# Patient Record
Sex: Male | Born: 1950 | Race: White | Hispanic: No | Marital: Married | Smoking: Never smoker
Health system: Southern US, Academic
[De-identification: ages and names within clinical notes are randomized; demographics above are authoritative.]

## PROBLEM LIST (undated history)

## (undated) DIAGNOSIS — Z86718 Personal history of other venous thrombosis and embolism: Secondary | ICD-10-CM

## (undated) DIAGNOSIS — N200 Calculus of kidney: Secondary | ICD-10-CM

## (undated) DIAGNOSIS — Z955 Presence of coronary angioplasty implant and graft: Secondary | ICD-10-CM

## (undated) DIAGNOSIS — Z9989 Dependence on other enabling machines and devices: Secondary | ICD-10-CM

## (undated) DIAGNOSIS — J309 Allergic rhinitis, unspecified: Secondary | ICD-10-CM

## (undated) DIAGNOSIS — E785 Hyperlipidemia, unspecified: Secondary | ICD-10-CM

## (undated) DIAGNOSIS — G4730 Sleep apnea, unspecified: Secondary | ICD-10-CM

## (undated) DIAGNOSIS — Z973 Presence of spectacles and contact lenses: Secondary | ICD-10-CM

## (undated) DIAGNOSIS — K219 Gastro-esophageal reflux disease without esophagitis: Secondary | ICD-10-CM

## (undated) DIAGNOSIS — R600 Localized edema: Secondary | ICD-10-CM

## (undated) DIAGNOSIS — I10 Essential (primary) hypertension: Secondary | ICD-10-CM

## (undated) DIAGNOSIS — M539 Dorsopathy, unspecified: Secondary | ICD-10-CM

## (undated) DIAGNOSIS — M1990 Unspecified osteoarthritis, unspecified site: Secondary | ICD-10-CM

## (undated) DIAGNOSIS — I219 Acute myocardial infarction, unspecified: Secondary | ICD-10-CM

## (undated) DIAGNOSIS — E7800 Pure hypercholesterolemia, unspecified: Secondary | ICD-10-CM

## (undated) HISTORY — PX: CYSTOSCOPY W/ RETROGRADES: SHX1426

## (undated) HISTORY — PX: HX HERNIA REPAIR: SHX51

## (undated) HISTORY — PX: ACHILLES TENDON REPAIR: SUR1153

## (undated) HISTORY — PX: CATARACT EXTRACTION: SUR2

## (undated) HISTORY — PX: HX CORONARY STENT PLACEMENT: SHX49

---

## 1994-03-09 ENCOUNTER — Other Ambulatory Visit (HOSPITAL_COMMUNITY): Payer: Self-pay

## 2016-08-20 DIAGNOSIS — I213 ST elevation (STEMI) myocardial infarction of unspecified site: Secondary | ICD-10-CM | POA: Insufficient documentation

## 2016-09-07 DIAGNOSIS — I5043 Acute on chronic combined systolic (congestive) and diastolic (congestive) heart failure: Secondary | ICD-10-CM | POA: Insufficient documentation

## 2016-09-07 DIAGNOSIS — I2510 Atherosclerotic heart disease of native coronary artery without angina pectoris: Secondary | ICD-10-CM | POA: Insufficient documentation

## 2016-10-19 DIAGNOSIS — Z8679 Personal history of other diseases of the circulatory system: Secondary | ICD-10-CM | POA: Insufficient documentation

## 2020-01-22 LAB — LAB: COLOGUARD RESULT: NEGATIVE

## 2021-08-19 ENCOUNTER — Other Ambulatory Visit (HOSPITAL_COMMUNITY): Payer: Self-pay | Admitting: Orthopaedic Surgery

## 2021-08-19 ENCOUNTER — Other Ambulatory Visit (INDEPENDENT_AMBULATORY_CARE_PROVIDER_SITE_OTHER): Payer: Self-pay | Admitting: Surgery

## 2021-08-19 DIAGNOSIS — K4090 Unilateral inguinal hernia, without obstruction or gangrene, not specified as recurrent: Secondary | ICD-10-CM

## 2021-08-19 DIAGNOSIS — I10 Essential (primary) hypertension: Secondary | ICD-10-CM

## 2021-08-19 DIAGNOSIS — M1612 Unilateral primary osteoarthritis, left hip: Secondary | ICD-10-CM

## 2021-08-25 LAB — ENTER/EDIT EXTERNAL COMMON LAB RESULTS
CHOLESTEROL: 94
HDL-CHOLESTEROL: 43
HEMOGLOBIN A1C: 6.3
LDL (CALCULATED): 39
LDL CHOLESTEROL,DIRECT: 12
TRIGLYCERIDES: 49

## 2021-08-27 ENCOUNTER — Other Ambulatory Visit: Payer: Medicare PPO | Attending: Surgery

## 2021-08-27 ENCOUNTER — Other Ambulatory Visit: Payer: Self-pay

## 2021-08-27 ENCOUNTER — Inpatient Hospital Stay (HOSPITAL_COMMUNITY)
Admission: RE | Admit: 2021-08-27 | Discharge: 2021-08-27 | Disposition: A | Discharge status: Home / Self Care | Payer: Medicare PPO | Source: Ambulatory Visit | Attending: Surgery | Admitting: Surgery

## 2021-08-27 DIAGNOSIS — K4090 Unilateral inguinal hernia, without obstruction or gangrene, not specified as recurrent: Secondary | ICD-10-CM | POA: Insufficient documentation

## 2021-08-27 DIAGNOSIS — I10 Essential (primary) hypertension: Secondary | ICD-10-CM

## 2021-08-27 LAB — CBC WITH DIFF
BASOPHIL #: 0 10*3/uL (ref 0.00–2.50)
BASOPHIL %: 1 % (ref 0–3)
EOSINOPHIL #: 0.2 10*3/uL (ref 0.00–2.40)
EOSINOPHIL %: 3 % (ref 0–7)
HCT: 41.7 % — ABNORMAL LOW (ref 42.0–51.0)
HGB: 14.5 g/dL (ref 13.5–18.0)
LYMPHOCYTE #: 1.1 10*3/uL — ABNORMAL LOW (ref 2.10–11.00)
LYMPHOCYTE %: 20 % — ABNORMAL LOW (ref 25–45)
MCH: 32.1 pg — ABNORMAL HIGH (ref 27.0–32.0)
MCHC: 34.8 g/dL (ref 32.0–36.0)
MCV: 92.2 fL (ref 78.0–99.0)
MONOCYTE #: 0.4 10*3/uL (ref 0.00–4.10)
MONOCYTE %: 7 % (ref 0–12)
MPV: 7.9 fL (ref 7.4–10.4)
NEUTROPHIL #: 3.9 10*3/uL — ABNORMAL LOW (ref 4.10–29.00)
NEUTROPHIL %: 70 % (ref 40–76)
PLATELETS: 125 10*3/uL — ABNORMAL LOW (ref 140–440)
RBC: 4.53 10*6/uL (ref 4.20–6.00)
RDW: 14 % (ref 11.6–14.8)
WBC: 5.6 10*3/uL (ref 4.0–10.5)
WBCS UNCORRECTED: 5.6 10*3/uL

## 2021-08-27 LAB — COMPREHENSIVE METABOLIC PANEL, NON-FASTING
ALBUMIN/GLOBULIN RATIO: 1.6 — ABNORMAL HIGH (ref 0.8–1.4)
ALBUMIN: 4.2 g/dL (ref 3.5–5.7)
ALKALINE PHOSPHATASE: 67 U/L (ref 34–104)
ALT (SGPT): 18 U/L (ref 7–52)
ANION GAP: 7 mmol/L — ABNORMAL LOW (ref 10–20)
AST (SGOT): 14 U/L (ref 13–39)
BILIRUBIN TOTAL: 0.8 mg/dL (ref 0.3–1.2)
BUN/CREA RATIO: 25 — ABNORMAL HIGH (ref 6–22)
BUN: 27 mg/dL — ABNORMAL HIGH (ref 7–25)
CALCIUM, CORRECTED: 9.1 mg/dL (ref 8.9–10.8)
CALCIUM: 9.3 mg/dL (ref 8.6–10.3)
CHLORIDE: 105 mmol/L (ref 98–107)
CO2 TOTAL: 28 mmol/L (ref 21–31)
CREATININE: 1.09 mg/dL (ref 0.60–1.30)
ESTIMATED GFR: 74 mL/min/{1.73_m2} (ref >59)
GLOBULIN: 2.6 — ABNORMAL LOW (ref 2.9–5.4)
GLUCOSE: 113 mg/dL — ABNORMAL HIGH (ref 74–109)
OSMOLALITY, CALCULATED: 285 mOsm/kg (ref 270–290)
POTASSIUM: 4.3 mmol/L (ref 3.5–5.1)
PROTEIN TOTAL: 6.8 g/dL (ref 6.4–8.9)
SODIUM: 140 mmol/L (ref 136–145)

## 2021-09-09 ENCOUNTER — Encounter (HOSPITAL_COMMUNITY): Payer: Self-pay | Admitting: Surgery

## 2021-09-09 ENCOUNTER — Other Ambulatory Visit: Payer: Self-pay

## 2021-09-09 ENCOUNTER — Encounter (HOSPITAL_COMMUNITY): Admission: RE | Payer: Self-pay | Source: Ambulatory Visit

## 2021-09-09 ENCOUNTER — Inpatient Hospital Stay
Admission: RE | Admit: 2021-09-09 | Discharge: 2021-09-09 | Disposition: A | Discharge status: Home / Self Care | Payer: Medicare PPO | Source: Ambulatory Visit | Attending: Surgery | Admitting: Surgery

## 2021-09-09 ENCOUNTER — Encounter (HOSPITAL_COMMUNITY)
Admission: RE | Disposition: A | Discharge status: Home / Self Care | Payer: Self-pay | Source: Ambulatory Visit | Attending: Surgery

## 2021-09-09 ENCOUNTER — Ambulatory Visit (HOSPITAL_COMMUNITY): Payer: Medicare PPO | Admitting: Anesthesiology

## 2021-09-09 ENCOUNTER — Inpatient Hospital Stay (HOSPITAL_COMMUNITY): Admission: RE | Admit: 2021-09-09 | Payer: Medicare PPO | Source: Ambulatory Visit | Admitting: Surgery

## 2021-09-09 ENCOUNTER — Encounter (HOSPITAL_COMMUNITY): Payer: Medicare PPO | Admitting: Surgery

## 2021-09-09 DIAGNOSIS — K4090 Unilateral inguinal hernia, without obstruction or gangrene, not specified as recurrent: Secondary | ICD-10-CM | POA: Insufficient documentation

## 2021-09-09 HISTORY — DX: Calculus of kidney: N20.0

## 2021-09-09 HISTORY — DX: Allergic rhinitis, unspecified: J30.9

## 2021-09-09 HISTORY — DX: Unspecified osteoarthritis, unspecified site: M19.90

## 2021-09-09 HISTORY — DX: Pure hypercholesterolemia, unspecified: E78.00

## 2021-09-09 HISTORY — DX: Acute myocardial infarction, unspecified: I21.9

## 2021-09-09 HISTORY — DX: Gastro-esophageal reflux disease without esophagitis: K21.9

## 2021-09-09 HISTORY — DX: Essential (primary) hypertension: I10

## 2021-09-09 HISTORY — DX: Sleep apnea, unspecified: G47.30

## 2021-09-09 SURGERY — REPAIR HERNIA INGUINAL
Anesthesia: General | Site: Groin | Laterality: Right

## 2021-09-09 SURGERY — ROBOTIC REPAIR HERNIA INGUINAL LAPAROSCOPIC
Anesthesia: General | Site: Groin | Laterality: Right | Wound class: Clean Contaminated Wounds-The respiratory, GI, Genital, or urinary

## 2021-09-09 MED ORDER — BACITRACIN ZINC 500 UNIT-POLYMYXIN B 10,000 UNIT/GRAM TOPICAL OINTMENT
TOPICAL_OINTMENT | Freq: Once | CUTANEOUS | Status: DC | PRN
Start: 2021-09-09 — End: 2021-09-09
  Administered 2021-09-09: 14.2 mg via TOPICAL

## 2021-09-09 MED ORDER — MORPHINE 2 MG/ML INJECTION WRAPPER
2.0000 mg | INJECTION | INTRAMUSCULAR | Status: DC | PRN
Start: 2021-09-09 — End: 2021-09-09

## 2021-09-09 MED ORDER — SUGAMMADEX 100 MG/ML INTRAVENOUS SOLUTION
Freq: Once | INTRAVENOUS | Status: DC | PRN
Start: 2021-09-09 — End: 2021-09-09
  Administered 2021-09-09: 400 mg via INTRAVENOUS

## 2021-09-09 MED ORDER — FENTANYL (PF) 50 MCG/ML INJECTION WRAPPER
INJECTION | Freq: Once | INTRAMUSCULAR | Status: DC | PRN
Start: 2021-09-09 — End: 2021-09-09
  Administered 2021-09-09: 25 ug via INTRAVENOUS
  Administered 2021-09-09: 50 ug via INTRAVENOUS
  Administered 2021-09-09: 25 ug via INTRAVENOUS

## 2021-09-09 MED ORDER — LACTATED RINGERS INTRAVENOUS SOLUTION
INTRAVENOUS | Status: DC
Start: 2021-09-09 — End: 2021-09-09

## 2021-09-09 MED ORDER — EPHEDRINE SULFATE 50 MG/ML INTRAVENOUS SOLUTION
Freq: Once | INTRAVENOUS | Status: DC | PRN
Start: 2021-09-09 — End: 2021-09-09
  Administered 2021-09-09 (×6): 5 mg via INTRAVENOUS

## 2021-09-09 MED ORDER — ONDANSETRON HCL (PF) 4 MG/2 ML INJECTION SOLUTION
4.0000 mg | Freq: Once | INTRAMUSCULAR | Status: AC
Start: 2021-09-09 — End: 2021-09-09
  Administered 2021-09-09: 4 mg via INTRAVENOUS

## 2021-09-09 MED ORDER — FAMOTIDINE (PF) 20 MG/2 ML INTRAVENOUS SOLUTION
INTRAVENOUS | Status: AC
Start: 2021-09-09 — End: 2021-09-09
  Filled 2021-09-09: qty 2

## 2021-09-09 MED ORDER — MIDAZOLAM 5 MG/ML INJECTION WRAPPER
1.0000 mg | Freq: Once | INTRAMUSCULAR | Status: DC | PRN
Start: 2021-09-09 — End: 2021-09-09
  Administered 2021-09-09: 1 mg via INTRAVENOUS

## 2021-09-09 MED ORDER — ONDANSETRON HCL (PF) 4 MG/2 ML INJECTION SOLUTION
4.0000 mg | Freq: Four times a day (QID) | INTRAMUSCULAR | Status: DC | PRN
Start: 2021-09-09 — End: 2021-09-09

## 2021-09-09 MED ORDER — ALBUTEROL SULFATE 2.5 MG/3 ML (0.083 %) SOLUTION FOR NEBULIZATION
2.5000 mg | INHALATION_SOLUTION | Freq: Once | RESPIRATORY_TRACT | Status: DC | PRN
Start: 2021-09-09 — End: 2021-09-09

## 2021-09-09 MED ORDER — HYDROCODONE 7.5 MG-ACETAMINOPHEN 325 MG TABLET
1.0000 | ORAL_TABLET | Freq: Four times a day (QID) | ORAL | 0 refills | Status: AC | PRN
Start: 2021-09-09 — End: 2021-09-12

## 2021-09-09 MED ORDER — SODIUM CHLORIDE 0.9 % (FLUSH) INJECTION SYRINGE
3.0000 mL | INJECTION | Freq: Three times a day (TID) | INTRAMUSCULAR | Status: DC
Start: 2021-09-09 — End: 2021-09-09

## 2021-09-09 MED ORDER — SODIUM CHLORIDE 0.9 % (FLUSH) INJECTION SYRINGE
3.0000 mL | INJECTION | INTRAMUSCULAR | Status: DC | PRN
Start: 2021-09-09 — End: 2021-09-09

## 2021-09-09 MED ORDER — BACITRACIN ZINC 500 UNIT-POLYMYXIN B 10,000 UNIT/GRAM TOPICAL OINTMENT
TOPICAL_OINTMENT | CUTANEOUS | Status: AC
Start: 2021-09-09 — End: 2021-09-09
  Filled 2021-09-09: qty 14.2

## 2021-09-09 MED ORDER — ROPIVACAINE (PF) 2 MG/ML (0.2 %) INJECTION SOLUTION
Freq: Once | INTRAMUSCULAR | Status: DC | PRN
Start: 2021-09-09 — End: 2021-09-09
  Administered 2021-09-09: 40 mL via INTRAMUSCULAR

## 2021-09-09 MED ORDER — FENTANYL (PF) 50 MCG/ML INJECTION WRAPPER
50.0000 ug | INJECTION | INTRAMUSCULAR | Status: DC | PRN
Start: 2021-09-09 — End: 2021-09-09

## 2021-09-09 MED ORDER — ROPIVACAINE (PF) 2 MG/ML (0.2 %) INJECTION SOLUTION
INTRAMUSCULAR | Status: AC
Start: 2021-09-09 — End: 2021-09-09
  Filled 2021-09-09: qty 10

## 2021-09-09 MED ORDER — ONDANSETRON 4 MG DISINTEGRATING TABLET
4.0000 mg | ORAL_TABLET | Freq: Three times a day (TID) | ORAL | 0 refills | Status: DC | PRN
Start: 2021-09-09 — End: 2022-01-18

## 2021-09-09 MED ORDER — FENTANYL (PF) 50 MCG/ML INJECTION SOLUTION
INTRAMUSCULAR | Status: AC
Start: 2021-09-09 — End: 2021-09-09
  Filled 2021-09-09: qty 2

## 2021-09-09 MED ORDER — HYDROMORPHONE 2 MG/ML INJECTION WRAPPER
0.2000 mg | INJECTION | INTRAMUSCULAR | Status: DC | PRN
Start: 2021-09-09 — End: 2021-09-09

## 2021-09-09 MED ORDER — FAMOTIDINE (PF) 20 MG/2 ML INTRAVENOUS SOLUTION
20.0000 mg | Freq: Once | INTRAVENOUS | Status: AC
Start: 2021-09-09 — End: 2021-09-09
  Administered 2021-09-09: 20 mg via INTRAVENOUS

## 2021-09-09 MED ORDER — CEFAZOLIN 1 GRAM SOLUTION FOR INJECTION
Freq: Once | INTRAMUSCULAR | Status: DC | PRN
Start: 2021-09-09 — End: 2021-09-09
  Administered 2021-09-09: 2000 mg via INTRAVENOUS

## 2021-09-09 MED ORDER — METOCLOPRAMIDE 5 MG/ML INJECTION SOLUTION
10.0000 mg | Freq: Four times a day (QID) | INTRAMUSCULAR | Status: DC | PRN
Start: 2021-09-09 — End: 2021-09-09

## 2021-09-09 MED ORDER — ROCURONIUM 10 MG/ML INTRAVENOUS SYRINGE WRAPPER
INJECTION | Freq: Once | INTRAVENOUS | Status: DC | PRN
Start: 2021-09-09 — End: 2021-09-09
  Administered 2021-09-09: 5 mg via INTRAVENOUS
  Administered 2021-09-09: 40 mg via INTRAVENOUS

## 2021-09-09 MED ORDER — CEFAZOLIN 1 GRAM SOLUTION FOR INJECTION
INTRAMUSCULAR | Status: AC
Start: 2021-09-09 — End: 2021-09-09
  Filled 2021-09-09: qty 20

## 2021-09-09 MED ORDER — LIDOCAINE (PF) 100 MG/5 ML (2 %) INTRAVENOUS SYRINGE
INJECTION | Freq: Once | INTRAVENOUS | Status: DC | PRN
Start: 2021-09-09 — End: 2021-09-09
  Administered 2021-09-09: 80 mg via INTRAVENOUS

## 2021-09-09 MED ORDER — ONDANSETRON HCL (PF) 4 MG/2 ML INJECTION SOLUTION
INTRAMUSCULAR | Status: AC
Start: 2021-09-09 — End: 2021-09-09
  Filled 2021-09-09: qty 2

## 2021-09-09 MED ORDER — HYDROCODONE 5 MG-ACETAMINOPHEN 325 MG TABLET
1.0000 | ORAL_TABLET | ORAL | Status: DC | PRN
Start: 2021-09-09 — End: 2021-09-09
  Administered 2021-09-09: 1 via ORAL
  Filled 2021-09-09: qty 1

## 2021-09-09 MED ORDER — PROPOFOL 10 MG/ML IV BOLUS
INJECTION | Freq: Once | INTRAVENOUS | Status: DC | PRN
Start: 2021-09-09 — End: 2021-09-09
  Administered 2021-09-09: 150 mg via INTRAVENOUS

## 2021-09-09 MED ORDER — MIDAZOLAM 5 MG/ML INJECTION WRAPPER
INTRAMUSCULAR | Status: AC
Start: 2021-09-09 — End: 2021-09-09
  Filled 2021-09-09: qty 1

## 2021-09-09 MED ORDER — IPRATROPIUM 0.5 MG-ALBUTEROL 3 MG (2.5 MG BASE)/3 ML NEBULIZATION SOLN
3.0000 mL | INHALATION_SOLUTION | Freq: Once | RESPIRATORY_TRACT | Status: DC | PRN
Start: 2021-09-09 — End: 2021-09-09

## 2021-09-09 MED ORDER — SODIUM CHLORIDE 0.9 % INTRAVENOUS PIGGYBACK
INJECTION | INTRAVENOUS | Status: AC
Start: 2021-09-09 — End: 2021-09-09
  Filled 2021-09-09: qty 100

## 2021-09-09 MED ORDER — ACETAMINOPHEN 325 MG TABLET
650.0000 mg | ORAL_TABLET | ORAL | Status: DC | PRN
Start: 2021-09-09 — End: 2021-09-09

## 2021-09-09 SURGICAL SUPPLY — 89 items
ADH LIQUID LF  WTPRF VIAL PREP NONSTAIN MASTISOL STYRAX GUM MASTIC ALC MTHY SLCYT STRL CLR NHZR 2/3 (WOUND CARE SUPPLY) ×1 IMPLANT
ADH LQ LF VIAL AMP PREP MASTI_SOL STYRAX GUM MASTIC ALC MTHY (WOUND CARE/ENTEROSTOMAL SUPPLY) ×1
BAG BIOHAZ RD 30X24IN THK3 MIL C8-10GL LLDPE INFCT WASTE CAN (MED SURG SUPPLIES) ×2
BAG BIOHAZ RD 30X24IN THK3 MIL C8-10GL LLDPE INFCT WASTE CAN PNCT RST (MED SURG SUPPLIES) ×2
BAG BIOHAZ RD 43X30IN THK3 MIL C20-30GL LLDPE INFCT WASTE (MED SURG SUPPLIES) ×1
BAG BIOHAZ RD 43X30IN THK3 MIL C20-30GL LLDPE INFCT WASTE CAN PNCT RST (MED SURG SUPPLIES) ×1 IMPLANT
BAG SUT DVN STRL LF (SUTURE/WOUND CLOSURE) ×1 IMPLANT
BAG SUTURE DEVON STERILE LATEX FREE (SUTURE/WOUND CLOSURE) ×1
BLADE 11 2 END CBNSTL SURG STRL DISP (CUTTING ELEMENTS) ×1
BLADE 11 2 END CBNSTL SURG STRL DISP (SURGICAL CUTTING SUPPLIES) ×1 IMPLANT
BLADE SURG CLPR PVT ADJ HEAD 9661 STRL LF  DISP PURP (MED SURG SUPPLIES) ×2 IMPLANT
BLADE SURG CLPR PVT ADJ HEAD 9661 STRL LF DISP PURP (MED SURG SUPPLIES) ×2
CLEANER INSTR PREPZYME MUL-TRD CONTAINR NARSL NEUT PH BDGR (MISCELLANEOUS PT CARE ITEMS) ×1
CONV USE 102436 - NEEDLE HYPO  22GA 1.5IN STD MONOJECT SS POLYPROP REG BVL LL HUB UL SHRP ANTICORE BLU STRL LF  DISP (MED SURG SUPPLIES) ×2 IMPLANT
CONV USE ITEM 321025 - PAD XL 40X20X1IN DERMAPROX POSITION THE PNK PAD 1 LFT SHEET 1 STEP ARM PRTC TRND NONST DISP PIGAZZI (MED SURG SUPPLIES) ×1 IMPLANT
CONV USE ITEM 321852 - GLOVE SURG 6.5 LF  PLISPRN (GLOVES AND ACCESSORIES) ×1 IMPLANT
CONV USE ITEM 323185 - PAD EG 15SQ IN UNIV FOAM SPLT NONCORD ADULT 9100 SER (SURGICAL CUTTING SUPPLIES) ×1 IMPLANT
CONV USE ITEM 329146 - CLEANER INSTR PREPZYME MUL-TRD CONTAINR NARSL NEUT PH BDGR 22OZ (MISCELLANEOUS PT CARE ITEMS) ×1 IMPLANT
CONV USE ITEM 34153 - ELECTRODE ESURG BLADE PNCL 3/32IN STRL SS CAUT PSHBTN STD SHAFT LF  VEGA SER (SURGICAL CUTTING SUPPLIES) ×1 IMPLANT
CONV USE ITEM 45435 - STRIP SKNCLS MDSTRP FBR NYL POR MED 4X.5IN ADH HYPOALL REINF (SUTURE/WOUND CLOSURE) ×1 IMPLANT
CONV USE ITEM 46657 - SUTURE 2-0 V-20 POLYSRB 30IN VIOL BRD COAT ABS (SUTURE/WOUND CLOSURE) ×2 IMPLANT
CONV USE ITEM 81225 - BAG BIOHAZ RD 30X24IN THK3 MIL C8-10GL LLDPE INFCT WASTE CAN PNCT RST (MED SURG SUPPLIES) ×2 IMPLANT
CONV USE ITEM 91385 - SUTURE 0 GS-22 POLYSRB 30IN VIOL BRD COAT ABS (SUTURE/WOUND CLOSURE) ×1 IMPLANT
COUNTER 20 CNT BLOCK ADH NEEDLE STRL LF  RD SHARP FOAM 15.75X11.5X14IN DISP (MED SURG SUPPLIES) ×1 IMPLANT
COUNTER 20 CNT BLOCK ADH NEEDLE STRL LF RD SHARP FOAM 15.75 (MED SURG SUPPLIES) ×1
COVER 53X24IN MAYOSTAND PRXM STRL DISP EQP SMS LF (DRAPE/PACKS/SHEETS/OR TOWEL) ×1 IMPLANT
COVER ENDOSCP TIP HOT SHR DAVI_NCI ENDOWRIST 8MM STD CAUT (ROBOTIC SUPPLIES) ×1
COVER TBL 90X50IN STD SMS REINF FNFLD STRL LF  DISP (DRAPE/PACKS/SHEETS/OR TOWEL) ×2 IMPLANT
COVER TBL 90X50IN STD SMS REINF FNFLD STRL LF DISP (DRAPE/PACKS/SHEETS/OR TOWEL) ×2
DEVICE SUT STRATAFIX PDS + 15C_M ABS KNTLS TISS CONTROL (SUTURE/WOUND CLOSURE) ×1
DISCONTINUED NO SUB - DRESS TRNSPR 4.5X4IN FILM IV STRL LF (WOUND CARE SUPPLY) ×1 IMPLANT
DISCONTINUED USE 329547 - SEAL ENDOS INSTR 5-8MM DAVINCI XI UNIV (ENDOSCOPIC SUPPLIES) ×3 IMPLANT
DRAPE ABS FENESTRATE ADH 121X102X77IN ABDOMINAL 12IN 13IN PRXM LF  STRL DISP SURG SMS 20X36IN (DRAPE/PACKS/SHEETS/OR TOWEL) ×1 IMPLANT
DRAPE ABS FENESTRATE ADH 121X1_02X77IN ABDOMINAL 12IN 13IN (DRAPE/PACKS/SHEETS/OR TOWEL) ×1
DRAPE ARM 21X19X10.5IN DAVINCI XI EQP 21LB (DRAPE/PACKS/SHEETS/OR TOWEL) ×3 IMPLANT
DRAPE ARM 21X19X10.5IN DAVINCI_XI EQP 21LB (DRAPE/PACKS/SHEETS/OR TOWEL) ×3
DRAPE CLMN DAVINCI XI EQP (DRAPE/PACKS/SHEETS/OR TOWEL) ×2 IMPLANT
DRAPE MAYOSTAND CVR 53X24IN PR_XM LF STRL DISP EQP SMS (DRAPE/PACKS/SHEETS/OR TOWEL) ×1
DRESS TRNSPR 4.5X4IN FILM IV STRL LF (WOUND CARE/ENTEROSTOMAL SUPPLY) ×2
DRIVER NEEDLE 31.50CM 8MM SUTURECUT ENDOWRIST DAVINCI XI 38D LRG 1.1CM 10 USE (ROBOTIC SUPPLIES) ×1 IMPLANT
ELECTRODE ESURG BLADE PNCL 3/32IN STRL SS CAUT PSHBTN STD (CUTTING ELEMENTS) ×1
ENDOSCP MNTN CLEARIFY 8X6IN WA_RM HUB 2 CLTH TROCAR WP MRFBR (INSTRUMENTS ENDOMECHANICAL) ×1
FORCEPS FENESTRATE BIPOLAR (ROBOTIC SUPPLIES) ×1
FORCEPS LAPSCP 32.77CM 8MM DAVINCI XI ENDOWRIST 45D FENESTRATE BIPOLAR 2.1CM 10 USE (ROBOTIC SUPPLIES) ×1 IMPLANT
GLOVE SURG 6.5 LF  PLISPRN (GLOVES AND ACCESSORIES) ×1
GLOVE SURG 6.5 LF PF SMOOTH STRL WHT PLISPRN (GLOVES AND ACCESSORIES) ×1
GLOVE SURG 7 LF  PF STRL PLISPRN DISP (GLOVES AND ACCESSORIES) ×3 IMPLANT
GLOVE SURG 7 LF PF SMOOTH STRL WHT PLISPRN (GLOVES AND ACCESSORIES) ×3
GOWN SURG XL STD LGTH L3 HKLP CLSR RGLN SLEEVE TWL STRL LF (DRAPE/PACKS/SHEETS/OR TOWEL) ×4
GOWN SURG XL STD LGTH L3 HKLP CLSR RGLN SLEEVE TWL STRL LF  DISP GRN AERO BLU PRFRM FBRC (DRAPE/PACKS/SHEETS/OR TOWEL) ×4 IMPLANT
HDPE THK22 UM C40-45 GL L48 IN X W40 IN NATURAL (MISCELLANEOUS PT CARE ITEMS) ×6 IMPLANT
IMG CLEARIFY 8X6IN WARM HUB TROCAR WIPE MRFBR SYSTEM DISP (ENDOSCOPIC SUPPLIES) ×1 IMPLANT
LABEL MED CORRECT MED LABELING SYS 4 FLG 2 SHEET 24 PRPRNT (MED SURG SUPPLIES) ×1
LABEL MED CORRECT MED LABELING SYS 4 FLG 2 SHEET 24 PRPRNT STRL (MED SURG SUPPLIES) ×1 IMPLANT
MESH 3DMAX MED LFT 0115310_CS/1 (IMPLANTS SURGICAL MESH) ×1
MESH 3DMAX MED RGT 0115320_CS/1 (IMPLANTS SURGICAL MESH) ×1
MESH SURG BARD 3DMAX 5X3IN GROIN RGT SEAL EDGE STRL POLYPROP MED CURVE CONTOUR LAPSCP INGUINAL (IMPLANTS SURGICAL MESH) ×1 IMPLANT
MESH SURG PROGRIP 15X10CM SLF FIX STRL 70% CLGN 30% GLYC (IMPLANTS SURGICAL MESH)
NEEDLE DRIVER SUTURECUT LRG (ROBOTIC SUPPLIES) ×1
NEEDLE HYPO 22GA 1.5IN STD MONOJECT SS POLYPROP REG BVL LL (MED SURG SUPPLIES) ×2
NEEDLE INSFL 120MM 14GA STD SRGNDL PNMPRTN SPRG LD BLUNT STY STRL LF  DISP SS PLASTIC RD (ENDOSCOPIC SUPPLIES) ×1 IMPLANT
NEEDLE INSFL 120MM 14GA STD SR_GNDL PNMPRTN SPRG LD BLUNT STY (INSTRUMENTS ENDOMECHANICAL) ×1
PAD EG 15SQ IN UNIV FOAM SPLT NONCORD ADULT 9100 SER (CUTTING ELEMENTS) ×1
PAD XL 40X20X1IN DERMAPROX POSITION THE PNK PAD 1 LFT SHEET (MED SURG SUPPLIES) ×1
PAD XL 40X20X1IN DERMAPROX POSITION THE PNK PAD 1 LFT SHEET 1 STEP ARM PRTC TRND NONST DISP PIGAZZI (MED SURG SUPPLIES) ×1
SCISSOR LAPSCP 31.75CM 8MM DAVINCI XI HOT SHR ENDOWRIST 38D CURVE MONOPOL 1.3CM (ROBOTIC SUPPLIES) ×1 IMPLANT
SCISSORS LAPSCP HOT SHR DVNC_XI EWRST CRV MNPLR (ROBOTIC SUPPLIES) ×1
SEAL ENDOS INSTR 5-8MM DAVINCI_XI UNIV (INSTRUMENTS ENDOMECHANICAL) ×3
SET TUBING PNEUMOCLEAR HIFLO SMOKE EVAC (MED SURG SUPPLIES) ×1 IMPLANT
SET TUBING PNEUMOCLEAR HIFLO S_MOKE EVAC DEMOTE (MED SURG SUPPLIES) ×2
SLEEVE COMPRESS LRG KNEE LGTH KENDALL SCD NONST LF  DISP 26- IN (MED SURG SUPPLIES) ×1 IMPLANT
SLEEVE COMPRESS LRG KNEE LGTH KENDALL SCD NONST LF DISP 26- (MED SURG SUPPLIES) ×1
SOL IRRG 0.9% NACL 1000ML PLASTIC PR BTL PRSV FR DEHP-FR AQLT LF (MEDICATIONS/SOLUTIONS) ×1 IMPLANT
SOL IRRG 0.9% NACL 1000ML PRSV FR DEHP-FR STRL AQLT LF (MEDICATIONS/SOLUTIONS) ×1
SPONGE GAUZE 4X4IN MDCHC COTTON 12 PLY TY 7 LF  STRL DISP (WOUND CARE SUPPLY) ×1 IMPLANT
SPONGE GAUZE 4X4IN MDCHC COTTO_N 12 PLY TY 7 LF STRL DISP (WOUND CARE/ENTEROSTOMAL SUPPLY) ×2
SPONGE SURG 4X4IN 16 PLY XRY DETECT COTTON STRL LF  DISP (WOUND CARE SUPPLY) ×1 IMPLANT
SPONGE SURG 4X4IN 16 PLY_RADOPQ COT STRL LF DISP (WOUND CARE/ENTEROSTOMAL SUPPLY) ×1
STRIP SKNCLS MDSTRP FBR NYL POR MED 4X.5IN ADH HYPOALL REINF (SUTURE/WOUND CLOSURE) ×1
SUTURE 0 GS-22 POLYSRB 30IN VIOL BRD COAT ABS (SUTURE/WOUND CLOSURE) ×1
SUTURE 2-0 CT1 SPRL STRATAFIX PDS + 15CM VIOL ANBCTRL ABS KNOTLESS TISS CONTROL ABS (SUTURE/WOUND CLOSURE) ×1 IMPLANT
SUTURE 2-0 V-20 POLYSRB 30IN VIOL BRD COAT ABS (SUTURE/WOUND CLOSURE) ×2
SUTURE 5-0 C-13 POLYSRB 30IN UNDYED BRD COAT ABS (SUTURE/WOUND CLOSURE) ×2 IMPLANT
SYRINGE LL 10ML LF  STRL GRAD N-PYRG DEHP-FR PVC FREE MED DISP (MED SURG SUPPLIES) ×3 IMPLANT
SYRINGE LL 10ML LF STRL MED D_ISP (MED SURG SUPPLIES) ×3
TIP CAUT HOT SHR DAVINCI ENDOWRIST 8MM STD CAUT MONOPOL DISP (ROBOTIC SUPPLIES) ×1 IMPLANT
TOWEL 24X16IN COTTON BLU DISP SURG STRL LF (DRAPE/PACKS/SHEETS/OR TOWEL) ×6 IMPLANT
TROCAR BALLOON 12X100MM_C0R47 KII BLUNT TIP 6EA/BX (INSTRUMENTS ENDOMECHANICAL) ×1
TROCAR LAPSCP 100MM 12MM KII BAL BLUNT TIP STRL LF  OPTC ACCESS SYS ABDOMINAL (ENDOSCOPIC SUPPLIES) ×1 IMPLANT

## 2021-09-09 NOTE — OR Surgeon (Signed)
[facility]      Patient Name: Brandon Wells, Brandon Wells.  Hospital Number: [id number]  Date of Service: 09/09/2021   Date of Birth: 04-27-1951      Pre-Operative Diagnosis: RIGHT INGUINAL HERNIA     Post-Operative Diagnosis: RIGHT INGUINAL HERNIA    Procedure(s)/Description:  LAPAROSCOPIC ROBOTIC ASSISTED RIGHT INGUINAL HERNIA REPAIR WITH MESH:      Attending Surgeon: Fidela Juneau, MD     Anesthesia:  Anesthesiologist: Allena Napoleon, DO  CRNA: Orion Modest, CRNA    Anesthesia Type: .General     Estimated Blood Loss:none    The patient was brought into the operating room, placed on the table in the supine position. After general endotracheal anesthesia was provided, Abdomen was prepped and draped in a sterile manner. Small incision was made in the supraumbilical area. Kocher clamp placed on the umbilical ligament. Small incision was made in the fascia. Veress needle was inserted without any difficulty, and the abdomen was insufflated with CO2. After adequate insufflation with CO2, a 12 mm supraumbilical trocar was inserted without any difficulty, and a 30 degree upviewing laparoscope was then inserted. Under direct visualization, the other ports were inserted. Local anesthetic was infiltrated into the skin and abdominal wall at all the port sites for postoperative analgesia. Attention was turned to the area where the right indirect inguinal hernia was identified. The peritoneal covering was incised using the hot shears and superior and inferior flaps were developed. The hernia sac was successfully reduced. Blunt dissection was carried out to expose the entire inguinal area to make a large wide open space to allow for placement of the 3DMax mesh. The hernia defect was identified. Pubic tubercle and Cooper's ligament were also identified. After adequate blunt dissection was carried out, and hemostasis was properly obtained, the 3DMax mesh was inserted into the abdomen and aligned in the appropriate  position. The entire groin area covering the femoral component, indirect and direct components were adequately covered with the mesh. The mesh was fixed medially to the pubic tubercle and iliopubic tract with 2-0 Polysorb sutures and superiorly to the transversus abdominus muscle. Care was taken to avoid placing any sutures laterally or beyond the horizontal area where the ilioinguinal and iliohypogastric nerves travelled. Likewise, the inferior epigastric vessels were avoided. The mesh was noted to be placed very nicely and the abdomen was deflated to 66mm CO2 intraabdominal pressure to allow for reapproximation of the peritoneal covering which was done with a 2-0 Stratafix suture. Adequate coverage of the peritoneum was then noted. The abdomen was deflated of CO2 and the trocars were removed showing good hemostasis. The supraumbilical trocar site was closed with 0 Polysorb suture to approximate the fascia. Each of the incisions were then closed with 5-0 Polysorb suture in a buried subcuticular fashion. The areas were clean and dry. Steri-Strips were applied. The patient tolerated the procedure well. No complications were encountered.    No unplanned evens were encountered    Chalmers Iddings B Dany Harten, MD,MBA,FACS

## 2021-09-09 NOTE — Discharge Instructions (Addendum)
FOLLOW UP WITH DR. Donnal Debar ON Tuesday, October 05, 2021 AT 1:45 P.M.    MAY SHOWER TOMORROW EVENING. DO NOT REMOVE STERI STRIPS. PAT DRY.     UNLIMITED WALKING.    NO LIFTING OVER 15 POUNDS UNTIL ADVISED BY THE DOCTOR.    CALL FOR ANY PROBLEMS OR CONCERNS.    MAY RESUME HOME MEDICATIONS AS PRESCRIBED.

## 2021-09-09 NOTE — Anesthesia Preprocedure Evaluation (Addendum)
ANESTHESIA PRE-OP EVALUATION  Planned Procedure: LAPAROSCOPIC ROBOTIC ASSISTED RIGHT INGUINAL HERNIA REPAIR WITH MESH (Right: Groin)  Review of Systems     anesthesia history negative     patient summary reviewed  nursing notes reviewed        Pulmonary   sleep apnea and CPAP,   Cardiovascular    Hypertension, well controlled, past MI, ECG reviewed, cardiac stents (2018) and hyperlipidemia ,No peripheral edema,  Exercise Tolerance: > or = 4 METS   ,beta blocker therapy      GI/Hepatic/Renal    GERD, well controlled and kidney stones        Endo/Other   neg endo/other ROS,       Neuro/Psych/MS   negative neuro/psych ROS,      Cancer    negative hematology/oncology ROS,                   Physical Assessment      Airway       Mallampati: III    TM distance: <3 FB    Neck ROM: full  Mouth Opening: good.  No Facial hair          Dental           (+) caps             Pulmonary    Breath sounds clear to auscultation  (-) no rhonchi, no decreased breath sounds, no wheezes, no rales and no stridor     Cardiovascular    Rhythm: regular  Rate: Normal  (-) no friction rub, carotid bruit is not present, no peripheral edema and no murmur     Other findings            Plan  ASA 3     Planned anesthesia type: general     general anesthesia with endotracheal tube intubation    plan to administer opioids postoperatively    SLEEP APNEA  Patient is at risk of obstructive sleep apnea and Education provided regarding risk of obstructive sleep apnea        PONV/POV Plan:  I plan to administer pharmcologic prophalaxis antiemetics  Intravenous induction     Anesthesia issues/risks discussed are: Dental Injuries, Stroke, Nerve Injuries, Intraoperative Awareness/ Recall, Eye /Visual Loss, Aspiration, PONV, Blood Loss, Cardiac Events/MI and Sore Throat.  Anesthetic plan and risks discussed with patient  Signed consent obtained        Use of blood products discussed with patient who consented to blood products.     Patient's NPO status is  appropriate for Anesthesia.           Plan discussed with CRNA.

## 2021-09-09 NOTE — OR Nursing (Signed)
TOTAL C02 USED 28 LITERS    SETTINGS: FLOW 5, PRESSURE 15

## 2021-09-09 NOTE — H&P (Signed)
[facility]  General Surgery  History and Physical    Date of Service:  09/09/2021  Brandon Wells, [place]., 71 y.o. male  Date of Admission:  (Not on file)  Date of Birth:  January 09, 1951  PCP: Lucia Gaskins, DO    Reason for admission:  Robotic right inguinal hernia repair with mesh    HPI:  Brandon Kamel. is a 71 y.o. White male who is admitted for RIGHT INGUINAL HERNIA     No past medical history on file.   No past surgical history on file.        Family Medical History:    None         Cannot display prior to admission medications because the patient has not been admitted in this contact.         Not on File       No data found.       General: appropriate for age. in no acute distress.    Vital signs are present above and have been reviewed by me     HEENT: Atraumatic, Normocephalic. PERRLA, EOMI. Nose clear. Throat clear.    Lungs: Nonlabored breathing with symmetric expansion.  Clear to auscultation bilaterally    Heart:Regular wth respect to rate and rythmn.    Abdomen:Soft. Nontender. Nondistended and benign with an easily identifiable right inguinal hernia    Extremities:  Grossly normal with good range of motion and no major deformities.    Neuro:  Grossly normal motor and sensory function. CN's II through XII intact.    Psychiatric: Alert and oriented to person, place, and time. affect appropriate    Laboratory Data:     No results found for any visits on 09/09/21 (from the past 24 hour(s)).    Imaging Studies:    No orders to display        Assessment/Plan:  RIGHT INGUINAL HERNIA    Robotic right inguinal hernia repair with mesh scheduled for September 09, 2021    This note was partially created using voice recognition software and is inherently subject to errors including those of syntax and "sound alike " substitutions which may escape proof reading. In such instances, original meaning may be extrapolated by contextual derivation.    Fidela Juneau, MD, MBA, FACS

## 2021-09-09 NOTE — Anesthesia Postprocedure Evaluation (Signed)
Anesthesia Post Op Evaluation    Patient: Brandon Wells.  Procedure(s):  LAPAROSCOPIC ROBOTIC ASSISTED RIGHT INGUINAL HERNIA REPAIR WITH MESH    Last Vitals:Temperature: 36.3 C (97.4 F) (09/09/21 1015)  Heart Rate: 73 (09/09/21 1015)  BP (Non-Invasive): (!) 171/92 (09/09/21 1015)  Respiratory Rate: 14 (09/09/21 1015)  SpO2: 100 % (09/09/21 1015)    No notable events documented.    Patient is sufficiently recovered from the effects of anesthesia to participate in the evaluation and has returned to their pre-procedure level.  Patient location during evaluation: PACU       Patient participation: complete - patient participated  Level of consciousness: awake and alert and responsive to verbal stimuli    Pain score: 3  Pain management: adequate  Airway patency: patent    Anesthetic complications: no  Cardiovascular status: acceptable  Respiratory status: acceptable  Hydration status: acceptable  Patient post-procedure temperature: Pt Normothermic   PONV Status: Absent

## 2021-09-09 NOTE — Anesthesia Transfer of Care (Signed)
ANESTHESIA TRANSFER OF CARE   Brandon Wells. is a 71 y.o. ,male, Weight: 82.6 kg (182 lb)   had Procedure(s):  LAPAROSCOPIC ROBOTIC ASSISTED RIGHT INGUINAL HERNIA REPAIR WITH MESH  performed  09/09/21   Primary Service: Fidela Juneau, MD    Past Medical History:   Diagnosis Date   . Allergic rhinitis    . Arthritis    . Esophageal reflux    . Hypercholesterolemia    . Hypertension    . Myocardial infarction (CMS HCC)    . Renal calculi    . Sleep apnea       Allergy History as of 09/09/21     PREDNISONE       Noted Status Severity Type Reaction    09/09/21 0619 Duffy, Misty Stanley, CPhT 07/29/21 Active    Other Adverse Reaction (Add comment)    Comments: Insomnia               I completed my transfer of care / handoff to the receiving personnel during which we discussed:  Access, Airway, All key/critical aspects of case discussed, Analgesia, Antibiotics, Expectation of post procedure, Fluids/Product, Gave opportunity for questions and acknowledgement of understanding, Labs and PMHx    Post Location: PACU                                       Report given to: Lucia Estelle, RN                           Last OR Temp: Temperature: 36.3 C (97.4 F)  ABG:  POTASSIUM   Date Value Ref Range Status   08/27/2021 4.3 3.5 - 5.1 mmol/L Final     CALCIUM   Date Value Ref Range Status   08/27/2021 9.3 8.6 - 10.3 mg/dL Final     Airway:* No LDAs found *  Blood pressure (!) 171/92, pulse 73, temperature 36.3 C (97.4 F), resp. rate 14, height 1.727 m (5\' 8" ), weight 82.6 kg (182 lb), SpO2 100 %.

## 2021-09-10 LAB — ECG 12 LEAD
Atrial Rate: 62 {beats}/min
Calculated P Axis: 57 degrees
Calculated R Axis: 51 degrees
Calculated T Axis: -15 degrees
PR Interval: 164 ms
QRS Duration: 80 ms
QT Interval: 400 ms
QTC Calculation: 406 ms
Ventricular rate: 62 {beats}/min

## 2021-09-14 ENCOUNTER — Telehealth (INDEPENDENT_AMBULATORY_CARE_PROVIDER_SITE_OTHER): Payer: Self-pay | Admitting: Surgery

## 2021-09-15 ENCOUNTER — Ambulatory Visit (HOSPITAL_COMMUNITY): Payer: Self-pay

## 2021-09-20 ENCOUNTER — Other Ambulatory Visit: Payer: Self-pay

## 2021-09-20 ENCOUNTER — Inpatient Hospital Stay
Admission: RE | Admit: 2021-09-20 | Discharge: 2021-09-20 | Disposition: A | Discharge status: Home / Self Care | Payer: Medicare PPO | Source: Ambulatory Visit | Attending: Orthopaedic Surgery | Admitting: Orthopaedic Surgery

## 2021-09-20 DIAGNOSIS — M1612 Unilateral primary osteoarthritis, left hip: Secondary | ICD-10-CM | POA: Insufficient documentation

## 2021-09-20 MED ORDER — IOHEXOL 300 MG IODINE/ML INTRAVENOUS SOLUTION
5.0000 mL | INTRAVENOUS | Status: AC
Start: 2021-09-20 — End: 2021-09-20
  Administered 2021-09-20: 5 mL via INTRA_ARTICULAR

## 2021-09-20 MED ORDER — IOHEXOL 300 MG IODINE/ML INTRAVENOUS SOLUTION
5.0000 mL | INTRAVENOUS | Status: DC
Start: 2021-09-20 — End: 2021-09-21

## 2021-09-20 MED ORDER — TRIAMCINOLONE ACETONIDE 40 MG/ML SUSPENSION FOR INJECTION
INTRAMUSCULAR | Status: AC
Start: 2021-09-20 — End: 2021-09-20
  Filled 2021-09-20: qty 2

## 2021-09-20 MED ORDER — LIDOCAINE (PF) 20 MG/ML (2 %) INJECTION SOLUTION
INTRAMUSCULAR | Status: AC
Start: 2021-09-20 — End: 2021-09-20
  Filled 2021-09-20: qty 10

## 2021-09-20 MED ORDER — BUPIVACAINE (PF) 0.25 % (2.5 MG/ML) INJECTION SOLUTION
INTRAMUSCULAR | Status: AC
Start: 2021-09-20 — End: 2021-09-20
  Filled 2021-09-20: qty 10

## 2021-10-05 ENCOUNTER — Encounter (INDEPENDENT_AMBULATORY_CARE_PROVIDER_SITE_OTHER): Payer: Self-pay | Admitting: Surgery

## 2021-10-05 ENCOUNTER — Ambulatory Visit (INDEPENDENT_AMBULATORY_CARE_PROVIDER_SITE_OTHER): Payer: Medicare PPO | Admitting: Surgery

## 2021-10-05 ENCOUNTER — Other Ambulatory Visit: Payer: Self-pay

## 2021-10-05 VITALS — BP 123/73 | HR 69 | Temp 98.2°F | Ht 68.0 in | Wt 172.0 lb

## 2021-10-05 DIAGNOSIS — Z9889 Other specified postprocedural states: Secondary | ICD-10-CM

## 2021-10-05 NOTE — Progress Notes (Signed)
GENERAL SURGERY, [facility]  [address]  [place] [address]       Name: Brandon Wells. MRN:  [id number]   Date: 10/05/2021 Age: 71 y.o. July 11, 1950      PCP: Lucia Gaskins, DO     Subjective  Brandon Wells. is a 71 y.o. year old male who presents for follow up of robotic right inguinal hernia repair with mesh .  Patient pleased with her postoperative progress at this point.  Has had appropriate incisional discomfort.  Has noted no recurrent bulge or incisional problems.  No GI or urinary difficulties postoperatively.    There are no problems to display for this patient.       Current Outpatient Medications   Medication Sig   . acetaminophen (TYLENOL ARTHRITIS PAIN) 650 mg Oral Tablet Sustained Release Take 2 Tablets (1,300 mg total) by mouth Twice daily (Patient not taking: Reported on 10/05/2021)   . amLODIPine-benazepril (LOTREL) 5-20 mg Oral Capsule Take 1 Capsule by mouth Once a day   . aspirin (ECOTRIN) 81 mg Oral Tablet, Delayed Release (E.C.) Take 1 Tablet (81 mg total) by mouth Once a day   . carvediloL (COREG) 6.25 mg Oral Tablet Take 1 Tablet (6.25 mg total) by mouth Twice daily   . cholecalciferol, vitamin D3, 25 mcg (1,000 unit) Oral Tablet Take 1 Tablet (1,000 Units total) by mouth Once a day   . clopidogreL (PLAVIX) 75 mg Oral Tablet Take 1 Tablet (75 mg total) by mouth Once a day   . cyanocobalamin, vitamin B-12, 1,000 mcg Oral Capsule 2,500 mcg by Other route   . lisinopriL (PRINIVIL) 20 mg Oral Tablet Take 1 Tablet (20 mg total) by mouth   . mometasone (NASONEX) 50 mcg/actuation Nasal Spray, Non-Aerosol Administer 2 Sprays into affected nostril(s) Once per day as needed   . ondansetron (ZOFRAN ODT) 4 mg Oral Tablet, Rapid Dissolve Take 1 Tablet (4 mg total) by mouth Every 8 hours as needed for Nausea/Vomiting   . pantoprazole (PROTONIX) 40 mg Oral Tablet, Delayed Release (E.C.) Take 1 Tablet (40 mg total) by mouth Once a day          Objective: BP 123/73    Pulse 69   Temp 36.8 C (98.2 F)   Ht 1.727 m (5\' 8" )   Wt 78 kg (172 lb)   SpO2 92%   BMI 26.15 kg/m          General:appropriate for age. in no acute distress.    Vital signs are present above and have been reviewed by me     Hernia:  Patient was examined in the standing and lying position.  There was no evidence of recurrent or residual hernia.  Operative incision sites healing without difficulty.  No erythema or expressible drainage.    Assessment/Plan  No diagnosis found.    Patient is pleased with the results.  There is no evidence of recurrent or residual hernia on today's exam.  Preoperative symptoms have resolved. The patient was instructed to resume usual diet and increase activities as tolerated.   I would be happy to see again at any time.  Opportunity was given for questions, and none were voiced past the above discussion.  The patient is free to return at any time for further questions and or difficulties.    Damere Brandenburg B Keslie Gritz, MD,MBA,FACS

## 2021-10-20 ENCOUNTER — Encounter (INDEPENDENT_AMBULATORY_CARE_PROVIDER_SITE_OTHER): Payer: Self-pay | Admitting: Internal Medicine

## 2021-11-10 ENCOUNTER — Ambulatory Visit (INDEPENDENT_AMBULATORY_CARE_PROVIDER_SITE_OTHER): Payer: Self-pay | Admitting: INTERVENTIONAL CARDIOLOGY

## 2021-12-08 ENCOUNTER — Other Ambulatory Visit (INDEPENDENT_AMBULATORY_CARE_PROVIDER_SITE_OTHER): Payer: Self-pay | Admitting: Internal Medicine

## 2021-12-08 DIAGNOSIS — I4891 Unspecified atrial fibrillation: Secondary | ICD-10-CM

## 2022-01-14 DIAGNOSIS — R7989 Other specified abnormal findings of blood chemistry: Secondary | ICD-10-CM | POA: Insufficient documentation

## 2022-01-14 DIAGNOSIS — R778 Other specified abnormalities of plasma proteins: Secondary | ICD-10-CM | POA: Insufficient documentation

## 2022-01-14 DIAGNOSIS — Z9989 Dependence on other enabling machines and devices: Secondary | ICD-10-CM | POA: Insufficient documentation

## 2022-01-14 DIAGNOSIS — Z955 Presence of coronary angioplasty implant and graft: Secondary | ICD-10-CM | POA: Insufficient documentation

## 2022-01-14 DIAGNOSIS — E7800 Pure hypercholesterolemia, unspecified: Secondary | ICD-10-CM | POA: Insufficient documentation

## 2022-01-14 DIAGNOSIS — E8770 Fluid overload, unspecified: Secondary | ICD-10-CM | POA: Insufficient documentation

## 2022-01-14 DIAGNOSIS — R0902 Hypoxemia: Secondary | ICD-10-CM | POA: Insufficient documentation

## 2022-01-14 DIAGNOSIS — I10 Essential (primary) hypertension: Secondary | ICD-10-CM | POA: Insufficient documentation

## 2022-01-17 ENCOUNTER — Encounter (INDEPENDENT_AMBULATORY_CARE_PROVIDER_SITE_OTHER): Payer: Self-pay | Admitting: Internal Medicine

## 2022-01-17 ENCOUNTER — Telehealth (INDEPENDENT_AMBULATORY_CARE_PROVIDER_SITE_OTHER): Payer: Self-pay | Admitting: Internal Medicine

## 2022-01-18 ENCOUNTER — Other Ambulatory Visit: Payer: Self-pay

## 2022-01-18 ENCOUNTER — Ambulatory Visit (INDEPENDENT_AMBULATORY_CARE_PROVIDER_SITE_OTHER): Payer: Medicare PPO | Admitting: Family

## 2022-01-18 ENCOUNTER — Encounter (INDEPENDENT_AMBULATORY_CARE_PROVIDER_SITE_OTHER): Payer: Self-pay | Admitting: Family

## 2022-01-18 VITALS — BP 142/78 | HR 52 | Ht 68.0 in | Wt 174.4 lb

## 2022-01-18 DIAGNOSIS — I480 Paroxysmal atrial fibrillation: Secondary | ICD-10-CM

## 2022-01-18 DIAGNOSIS — I5043 Acute on chronic combined systolic (congestive) and diastolic (congestive) heart failure: Secondary | ICD-10-CM

## 2022-01-18 DIAGNOSIS — I2510 Atherosclerotic heart disease of native coronary artery without angina pectoris: Secondary | ICD-10-CM

## 2022-01-18 DIAGNOSIS — I10 Essential (primary) hypertension: Secondary | ICD-10-CM

## 2022-01-18 MED ORDER — MOMETASONE 50 MCG/ACTUATION NASAL SPRAY
2.0000 | Freq: Every day | NASAL | 2 refills | Status: DC | PRN
Start: 2022-01-18 — End: 2022-10-24

## 2022-01-18 NOTE — Progress Notes (Signed)
INTERNAL MEDICINE, [facility]  [address]  [place] [address]       Name: Brandon Wells. MRN:  [id number]   Date: 01/18/2022 Age: 71 y.o.       Chief Complaint:    Chief Complaint   Patient presents with    Hospital Follow Up     Pt was in the hospital for fluid retention.  He did a echo and changed his medication and he is feeling much better.         HPI:  Brandon Wells. is a 72 y.o. male who presents for follow-up.  He is doing well and weight is staying stable.  He will follow with his cardiologist in [place].          Past Medical History:  Past Medical History:   Diagnosis Date    Allergic rhinitis     Arthritis     Esophageal reflux     Hypercholesterolemia     Hypertension     Myocardial infarction (CMS HCC)     Renal calculi     Sleep apnea          Past Surgical History:   Procedure Laterality Date    ACHILLES TENDON REPAIR Left     HX CORONARY STENT PLACEMENT      HX HERNIA REPAIR Bilateral     inguinal      Current Outpatient Medications   Medication Sig    aspirin (ECOTRIN) 81 mg Oral Tablet, Delayed Release (E.C.) Take 1 Tablet (81 mg total) by mouth Once a day    atorvastatin (LIPITOR) 80 mg Oral Tablet Take 1 Tablet (80 mg total) by mouth Every night    carvediloL (COREG) 6.25 mg Oral Tablet Take 1 Tablet (6.25 mg total) by mouth Twice daily    clopidogreL (PLAVIX) 75 mg Oral Tablet Take 1 Tablet (75 mg total) by mouth Once a day    furosemide (LASIX) 20 mg Oral Tablet Take 1 Tablet (20 mg total) by mouth Once a day    lisinopriL (PRINIVIL) 5 mg Oral Tablet Take 1 Tablet (5 mg total) by mouth Once a day    metoprolol succinate (TOPROL-XL) 25 mg Oral Tablet Sustained Release 24 hr Take 1 Tablet (25 mg total) by mouth Once a day    mometasone (NASONEX) 50 mcg/actuation Nasal Spray, Non-Aerosol Administer 2 Sprays into affected nostril(s) Once per day as needed    pantoprazole (PROTONIX) 40 mg Oral Tablet, Delayed Release (E.C.) Take 1 Tablet (40 mg total) by mouth  Once a day     Allergies   Allergen Reactions    Prednisone  Other Adverse Reaction (Add comment)     Insomnia       Family History:  Family Medical History:       Problem Relation (Age of Onset)    Bone cancer Father    Esophageal cancer Mother    Heart Disease Brother    Lung Cancer Mother              Social History:   Social History     Tobacco Use   Smoking Status Never   Smokeless Tobacco Never     Social History     Substance and Sexual Activity   Alcohol Use Yes    Comment: occasionally     Social History     Occupational History    Not on file       Review of Systems:  Review of systems as discussed in HPI    Problem List:  Patient Active Problem List   Diagnosis    STEMI (ST elevation myocardial infarction) (CMS HCC)    Coronary artery disease involving native coronary artery of native heart without angina pectoris    Volume overload    Systolic and diastolic CHF, 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     Appearance: Normal appearance. He is normal weight. He is not diaphoretic.   HENT:      Head: Normocephalic.      Right Ear: Tympanic membrane normal.      Left Ear: Tympanic membrane normal.      Nose: Nose normal.      Mouth/Throat:      Mouth: Mucous membranes are moist.      Pharynx: Oropharynx is clear.   Eyes:      Pupils: Pupils are equal, round, and reactive to light.   Cardiovascular:      Rate and Rhythm: Normal rate and regular rhythm.   Pulmonary:      Breath sounds: Normal breath sounds.   Abdominal:      General: Bowel sounds are normal.      Palpations:  Abdomen is soft.   Musculoskeletal:         General: Normal range of motion.      Cervical back: Normal range of motion and neck supple.   Skin:     General: Skin is warm and dry.   Neurological:      Mental Status: He is alert.   Psychiatric:         Mood and Affect: Mood normal.         Behavior: Behavior normal.         Thought Content: Thought content normal.         Judgment: Judgment normal.      Health Maintenance:  Health Maintenance   Topic Date Due    Hepatitis C screening  Never done    Depression Screening  Never done    Adult Tdap-Td (1 - Tdap) Never done    Colonoscopy  Never done    Shingles Vaccine (1 of 2) Never done    Pneumococcal Vaccination, Age 39+ (1 - PCV) Never done    Covid-19 Vaccine (4 - Pfizer series) 11/18/2020    Annual Wellness Visit  Never done    Influenza Vaccine (1) 02/18/2022    Meningococcal Vaccine  Aged Out        Assessment:    ICD-10-CM    1. PAF (paroxysmal atrial fibrillation) (CMS HCC)  I48.0       2. 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 Essential hypertension  I10       4. Coronary  artery disease involving native coronary artery of native heart without angina pectoris  I25.10            Plan:  Will continue medications.  He will continue to monitor weight daily and restrict sodium intake.      Orders Placed This Encounter    mometasone (NASONEX) 50 mcg/actuation Nasal Spray, Non-Aerosol              Follow up:  As scheduled.      This note was partially created using voice recognition software and is inherently subject to errors including those of syntax and "sound alike " substitutions which may escape proof reading.  In such instances, original meaning may be extrapolated by contextual derivation.    [address] [place], FNP-BC  01/18/2022, 09:56

## 2022-03-02 ENCOUNTER — Other Ambulatory Visit (INDEPENDENT_AMBULATORY_CARE_PROVIDER_SITE_OTHER): Payer: Self-pay | Admitting: Internal Medicine

## 2022-03-02 ENCOUNTER — Telehealth (INDEPENDENT_AMBULATORY_CARE_PROVIDER_SITE_OTHER): Payer: Self-pay | Admitting: Internal Medicine

## 2022-03-02 DIAGNOSIS — I10 Essential (primary) hypertension: Secondary | ICD-10-CM

## 2022-03-02 NOTE — Telephone Encounter (Signed)
Pt is requesting an order for another hip injection done at the hospital

## 2022-03-02 NOTE — Telephone Encounter (Signed)
Pt is requesting a lab request for the hospital for his kidney function and potassium per his cardiologist in [place]

## 2022-03-02 NOTE — Telephone Encounter (Signed)
Pt is requesting a referral to have hip injection at the hospital again

## 2022-04-15 ENCOUNTER — Other Ambulatory Visit (HOSPITAL_COMMUNITY): Payer: Self-pay | Admitting: Orthopaedic Surgery

## 2022-04-15 DIAGNOSIS — M1612 Unilateral primary osteoarthritis, left hip: Secondary | ICD-10-CM

## 2022-04-25 ENCOUNTER — Encounter (HOSPITAL_COMMUNITY): Payer: Self-pay

## 2022-04-25 ENCOUNTER — Encounter (INDEPENDENT_AMBULATORY_CARE_PROVIDER_SITE_OTHER): Payer: Self-pay | Admitting: Internal Medicine

## 2022-04-25 ENCOUNTER — Inpatient Hospital Stay
Admission: RE | Admit: 2022-04-25 | Discharge: 2022-04-25 | Disposition: A | Discharge status: Home / Self Care | Payer: Medicare PPO | Source: Ambulatory Visit | Attending: Orthopaedic Surgery | Admitting: Orthopaedic Surgery

## 2022-04-25 ENCOUNTER — Other Ambulatory Visit: Payer: Self-pay

## 2022-04-25 ENCOUNTER — Other Ambulatory Visit (HOSPITAL_COMMUNITY): Payer: Self-pay | Admitting: Orthopaedic Surgery

## 2022-04-25 ENCOUNTER — Ambulatory Visit (INDEPENDENT_AMBULATORY_CARE_PROVIDER_SITE_OTHER): Payer: Medicare PPO | Admitting: Internal Medicine

## 2022-04-25 VITALS — BP 123/69 | HR 57 | Resp 16 | Ht 68.0 in | Wt 173.0 lb

## 2022-04-25 DIAGNOSIS — R5382 Chronic fatigue, unspecified: Secondary | ICD-10-CM

## 2022-04-25 DIAGNOSIS — Z136 Encounter for screening for cardiovascular disorders: Secondary | ICD-10-CM

## 2022-04-25 DIAGNOSIS — M1612 Unilateral primary osteoarthritis, left hip: Secondary | ICD-10-CM

## 2022-04-25 DIAGNOSIS — E559 Vitamin D deficiency, unspecified: Secondary | ICD-10-CM

## 2022-04-25 DIAGNOSIS — E538 Deficiency of other specified B group vitamins: Secondary | ICD-10-CM

## 2022-04-25 DIAGNOSIS — Z955 Presence of coronary angioplasty implant and graft: Secondary | ICD-10-CM

## 2022-04-25 DIAGNOSIS — I480 Paroxysmal atrial fibrillation: Secondary | ICD-10-CM

## 2022-04-25 DIAGNOSIS — Z8679 Personal history of other diseases of the circulatory system: Secondary | ICD-10-CM

## 2022-04-25 DIAGNOSIS — I10 Essential (primary) hypertension: Secondary | ICD-10-CM

## 2022-04-25 DIAGNOSIS — I5043 Acute on chronic combined systolic (congestive) and diastolic (congestive) heart failure: Secondary | ICD-10-CM

## 2022-04-25 DIAGNOSIS — G4733 Obstructive sleep apnea (adult) (pediatric): Secondary | ICD-10-CM

## 2022-04-25 DIAGNOSIS — Z13220 Encounter for screening for lipoid disorders: Secondary | ICD-10-CM

## 2022-04-25 DIAGNOSIS — I213 ST elevation (STEMI) myocardial infarction of unspecified site: Secondary | ICD-10-CM

## 2022-04-25 MED ORDER — BUPIVACAINE HCL 0.25 % (2.5 MG/ML) INJECTION SOLUTION
INTRAMUSCULAR | Status: AC
Start: 2022-04-25 — End: 2022-04-25
  Filled 2022-04-25: qty 50

## 2022-04-25 MED ORDER — TRIAMCINOLONE ACETONIDE 40 MG/ML SUSPENSION FOR INJECTION
INTRAMUSCULAR | Status: AC
Start: 2022-04-25 — End: 2022-04-25
  Filled 2022-04-25: qty 2

## 2022-04-25 MED ORDER — LIDOCAINE HCL 20 MG/ML (2 %) INJECTION SOLUTION
INTRAMUSCULAR | Status: AC
Start: 2022-04-25 — End: 2022-04-25
  Filled 2022-04-25: qty 20

## 2022-04-25 NOTE — Progress Notes (Signed)
[facility]  [address]  [place] [address]       Name: Brandon Wells. MRN:  [id number]   Date: 04/25/2022 Age: 71 y.o.       Chief Complaint:    Chief Complaint   Patient presents with    Follow Up 6 Months        HPI:  Brandon Wells. is a 71 y.o. male who presents to the office today for follow-up.  Patient is actually doing relatively well and except for minor issues as described below which I have addressed in total with the patient.        Past Medical History:  Past Medical History:   Diagnosis Date    Allergic rhinitis     Arthritis     Esophageal reflux     Hypercholesterolemia     Hypertension     Myocardial infarction (CMS HCC)     Renal calculi     Sleep apnea          Past Surgical History:   Procedure Laterality Date    ACHILLES TENDON REPAIR Left     HX CORONARY STENT PLACEMENT      HX HERNIA REPAIR Bilateral     inguinal      Current Outpatient Medications   Medication Sig    aspirin (ECOTRIN) 81 mg Oral Tablet, Delayed Release (E.C.) Take 1 Tablet (81 mg total) by mouth Once a day    atorvastatin (LIPITOR) 80 mg Oral Tablet Take 1 Tablet (80 mg total) by mouth Every night    clopidogreL (PLAVIX) 75 mg Oral Tablet Take 1 Tablet (75 mg total) by mouth Once a day    furosemide (LASIX) 20 mg Oral Tablet Take 1 Tablet (20 mg total) by mouth Once a day    lisinopriL (PRINIVIL) 5 mg Oral Tablet Take 1 Tablet (5 mg total) by mouth Once a day    metoprolol succinate (TOPROL-XL) 25 mg Oral Tablet Sustained Release 24 hr Take 1 Tablet (25 mg total) by mouth Once a day    mometasone (NASONEX) 50 mcg/actuation Nasal Spray, Non-Aerosol Administer 2 Sprays into affected nostril(s) Once per day as needed    pantoprazole (PROTONIX) 40 mg Oral Tablet, Delayed Release (E.C.) Take 1 Tablet (40 mg total) by mouth Once a day    spironolactone (ALDACTONE) 25 mg Oral Tablet Take 1 Tablet (25 mg total) by mouth Once a day     Allergies   Allergen Reactions    Prednisone   Other Adverse Reaction (Add comment)     Insomnia       Family History:  Family Medical History:       Problem Relation (Age of Onset)    Bone cancer Father    Esophageal cancer Mother    Heart Disease Brother    Lung Cancer Mother              Social History:   Social History     Tobacco Use   Smoking Status Never   Smokeless Tobacco Never     Social History     Substance and Sexual Activity   Alcohol Use Yes    Comment: occasionally     Social History     Occupational History    Not on file       Review of Systems:  Review of systems as discussed in HPI    Problem List:  Patient Active Problem List  Diagnosis    STEMI (ST elevation myocardial infarction) (CMS HCC)    Coronary artery disease involving native coronary artery of native heart without angina pectoris    Volume overload    Systolic and diastolic CHF, 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   Constitutional:       General: He is not in acute distress.     Appearance: Normal appearance.   HENT:      Head: Normocephalic.      Right Ear: Tympanic membrane normal.      Left Ear: Tympanic membrane normal.      Nose: Nose normal.      Mouth/Throat:      Mouth: Mucous membranes are moist.   Eyes:      General: No scleral icterus.     Extraocular Movements: Extraocular movements intact.      Conjunctiva/sclera: Conjunctivae normal.      Pupils: Pupils are equal, round, and reactive to light.   Neck:      Vascular: No carotid bruit.   Cardiovascular:      Rate and Rhythm: Normal rate and regular  rhythm.      Pulses: Normal pulses.           Dorsalis pedis pulses are 2+ on the right side and 2+ on the left side.        Posterior tibial pulses are 2+ on the right side and 2+ on the left side.      Heart sounds: Normal heart sounds.   Pulmonary:      Effort: Pulmonary effort is normal.      Breath sounds: Normal breath sounds. No wheezing, rhonchi or rales.   Abdominal:      General: Abdomen is flat. Bowel sounds are normal.      Tenderness: There is no abdominal tenderness. There is no guarding.   Musculoskeletal:         General: No swelling. Normal range of motion.      Cervical back: Normal range of motion. No tenderness.      Right lower leg: No edema.      Left lower leg: No edema.   Skin:     General: Skin is warm.      Capillary Refill: Capillary refill takes less than 2 seconds.   Neurological:      General: No focal deficit present.      Mental Status: He is alert and oriented to person, place, and time.      Cranial Nerves: No cranial nerve deficit.        Health Maintenance:  Health Maintenance   Topic Date Due    Medicare Annual Wellness Visit  Never done    Influenza Vaccine (1) Never done    Adult Tdap-Td (1 - Tdap) 04/26/2023 (Originally [birth year]29[birth year]1972)  Shingles Vaccine (1 of 2) 04/26/2023 (Originally 03/01/2003)    Covid-19 Vaccine (4 - 2023-24 season) 04/26/2023 (Originally 02/18/2022)    Pneumococcal Vaccination, Age 3+ (1 - PCV) 04/26/2023 (Originally 02/28/2018)    Colonoscopy  03/01/2023    Depression Screening  04/26/2023    Meningococcal Vaccine  Aged Out    Hepatitis C screening  Discontinued        Assessment:      ICD-10-CM    1. 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        2. STEMI (ST elevation myocardial infarction) (CMS HCC)  I21.3 The patient's condition is unchanged from prior.  We have discussed whether there is need for any therapeutic changes and the patient states that they are tolerating current treatments  and will just continue with current lifestyle.       3. 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 Continue current treatment plan as stable.       4. History of atrial fibrillation  Z86.79     Labs ordered here and on this visit will be be addressed by me. I will contact patient and address therapy options once completed. LIPID PANEL      5. Essential hypertension  I10 Labs ordered here and on this visit will be be addressed by me. I will contact patient and address therapy options once completed.     CBC     URINALYSIS, MACROSCOPIC AND MICROSCOPIC W/CULTURE REFLEX     COMPREHENSIVE METABOLIC PANEL, NON-FASTING      6. PAF (paroxysmal atrial fibrillation) (CMS HCC)  I48.0 Condition stable will continue current therapy.        7. OSA on CPAP  G47.33 The patient continues to use their CPAP every evening and is compliant with its use.  They continue to benefit from CPAP use and has had improve lifestyle with this.       8. Chronic fatigue  R53.82 Labs ordered here and on this visit will be be addressed by me. I will contact patient and address therapy options once completed.     THYROID STIMULATING HORMONE WITH FREE T4 REFLEX      9. Vitamin B12 deficiency  E53.8 Labs ordered here and on this visit will be be addressed by me. I will contact patient and address therapy options once completed.     VITAMIN B12      10. Vitamin D deficiency  E55.9 Labs ordered here and on this visit will be be addressed by me. I will contact patient and address therapy options once completed.     VITAMIN D 25 TOTAL      11. Encounter for lipid screening for cardiovascular disease  Z13.220 LIPID PANEL    Z13.6              Orders Placed This Encounter    CBC    LIPID PANEL    VITAMIN B12    URINALYSIS, MACROSCOPIC AND MICROSCOPIC W/CULTURE REFLEX    COMPREHENSIVE METABOLIC PANEL, NON-FASTING    THYROID STIMULATING HORMONE WITH FREE T4 REFLEX    VITAMIN D 25  TOTAL       Depression screening is negative. PHQ 2 Total: 0          Follow up:  No follow-ups on file.    This note was partially created using voice recognition software and  is inherently subject to errors including those of syntax and "sound alike " substitutions which may escape proof reading.  In such instances, original meaning may be extrapolated by contextual derivation.    Nettie Elm, DO  04/25/2022, 11:14

## 2022-04-27 ENCOUNTER — Inpatient Hospital Stay
Admission: RE | Admit: 2022-04-27 | Discharge: 2022-04-27 | Disposition: A | Discharge status: Home / Self Care | Payer: Medicare PPO | Source: Ambulatory Visit | Attending: Orthopaedic Surgery | Admitting: Orthopaedic Surgery

## 2022-04-27 ENCOUNTER — Other Ambulatory Visit: Payer: Self-pay

## 2022-04-27 DIAGNOSIS — M1612 Unilateral primary osteoarthritis, left hip: Secondary | ICD-10-CM

## 2022-04-27 MED ORDER — IOHEXOL 350 MG IODINE/ML INTRAVENOUS SOLUTION
5.0000 mL | INTRAVENOUS | Status: DC
Start: 2022-04-27 — End: 2022-04-28

## 2022-04-27 MED ORDER — BUPIVACAINE HCL 0.25 % (2.5 MG/ML) INJECTION SOLUTION
INTRAMUSCULAR | Status: AC
Start: 2022-04-27 — End: 2022-04-27
  Filled 2022-04-27: qty 50

## 2022-04-27 MED ORDER — TRIAMCINOLONE ACETONIDE 40 MG/ML SUSPENSION FOR INJECTION
80.0000 mg | Freq: Once | INTRAMUSCULAR | Status: DC
Start: 2022-04-27 — End: 2022-04-28

## 2022-04-27 MED ORDER — LIDOCAINE HCL 20 MG/ML (2 %) INJECTION SOLUTION
INTRAMUSCULAR | Status: AC
Start: 2022-04-27 — End: 2022-04-27
  Filled 2022-04-27: qty 20

## 2022-04-27 MED ORDER — TRIAMCINOLONE ACETONIDE 40 MG/ML SUSPENSION FOR INJECTION
INTRAMUSCULAR | Status: AC
Start: 2022-04-27 — End: 2022-04-27
  Filled 2022-04-27: qty 2

## 2022-05-02 ENCOUNTER — Other Ambulatory Visit: Payer: Self-pay

## 2022-05-02 ENCOUNTER — Encounter (INDEPENDENT_AMBULATORY_CARE_PROVIDER_SITE_OTHER): Payer: Self-pay | Admitting: Internal Medicine

## 2022-05-02 ENCOUNTER — Ambulatory Visit (INDEPENDENT_AMBULATORY_CARE_PROVIDER_SITE_OTHER): Payer: Medicare PPO | Admitting: Internal Medicine

## 2022-05-02 VITALS — BP 131/77 | HR 59 | Resp 16 | Ht 68.0 in | Wt 173.0 lb

## 2022-05-02 DIAGNOSIS — Z955 Presence of coronary angioplasty implant and graft: Secondary | ICD-10-CM

## 2022-05-02 DIAGNOSIS — I10 Essential (primary) hypertension: Secondary | ICD-10-CM

## 2022-05-02 DIAGNOSIS — Z8679 Personal history of other diseases of the circulatory system: Secondary | ICD-10-CM

## 2022-05-02 DIAGNOSIS — M25561 Pain in right knee: Secondary | ICD-10-CM

## 2022-05-02 DIAGNOSIS — M25562 Pain in left knee: Secondary | ICD-10-CM

## 2022-05-02 MED ORDER — METHYLPREDNISOLONE 4 MG TABLETS IN A DOSE PACK
ORAL_TABLET | ORAL | 0 refills | Status: DC
Start: 2022-05-02 — End: 2022-08-09

## 2022-05-02 MED ORDER — PREDNISONE 10 MG TABLET
10.0000 mg | ORAL_TABLET | ORAL | 0 refills | Status: DC
Start: 2022-05-02 — End: 2022-05-02

## 2022-05-02 NOTE — Progress Notes (Signed)
[facility]  [address]  [place] [address]       Name: Brandon Wells. MRN:  [id number]   Date: 05/02/2022 Age: 71 y.o.       Chief Complaint:    Chief Complaint   Patient presents with    Knee Pain     Rt knee injection--        HPI:  Brandon Wells. is a 71 y.o. male who presents to the office today for follow-up.  Patient is actually doing relatively well and except for minor issues as described below which I have addressed in total with the patient.        Past Medical History:  Past Medical History:   Diagnosis Date    Allergic rhinitis     Arthritis     Esophageal reflux     Hypercholesterolemia     Hypertension     Myocardial infarction (CMS HCC)     Renal calculi     Sleep apnea          Past Surgical History:   Procedure Laterality Date    ACHILLES TENDON REPAIR Left     HX CORONARY STENT PLACEMENT      HX HERNIA REPAIR Bilateral     inguinal      Current Outpatient Medications   Medication Sig    aspirin (ECOTRIN) 81 mg Oral Tablet, Delayed Release (E.C.) Take 1 Tablet (81 mg total) by mouth Once a day    atorvastatin (LIPITOR) 80 mg Oral Tablet Take 1 Tablet (80 mg total) by mouth Every night    clopidogreL (PLAVIX) 75 mg Oral Tablet Take 1 Tablet (75 mg total) by mouth Once a day    furosemide (LASIX) 20 mg Oral Tablet Take 1 Tablet (20 mg total) by mouth Once a day    lisinopriL (PRINIVIL) 5 mg Oral Tablet Take 1 Tablet (5 mg total) by mouth Once a day    metoprolol succinate (TOPROL-XL) 25 mg Oral Tablet Sustained Release 24 hr Take 1 Tablet (25 mg total) by mouth Once a day    mometasone (NASONEX) 50 mcg/actuation Nasal Spray, Non-Aerosol Administer 2 Sprays into affected nostril(s) Once per day as needed    pantoprazole (PROTONIX) 40 mg Oral Tablet, Delayed Release (E.C.) Take 1 Tablet (40 mg total) by mouth Once a day    predniSONE (DELTASONE) 10 mg Oral Tablet Take 1 Tablet (10 mg total) by mouth Per instructions 1 tab tid for 3 days, 1 bid for  3 days, 1 tab daily for 3 days, 1/2 tab daily for 3 days then d/c    spironolactone (ALDACTONE) 25 mg Oral Tablet Take 1 Tablet (25 mg total) by mouth Once a day     Allergies   Allergen Reactions    Prednisone  Other Adverse Reaction (Add comment)     Insomnia       Family History:  Family Medical History:       Problem Relation (Age of Onset)    Bone cancer Father    Esophageal cancer Mother    Heart Disease Brother    Lung Cancer Mother              Social History:   Social History     Tobacco Use   Smoking Status Never   Smokeless Tobacco Never     Social History     Substance and Sexual Activity   Alcohol Use Yes  Comment: occasionally     Social History     Occupational History    Not on file       Review of Systems:  Review of systems as discussed in HPI    Problem List:  Patient Active Problem List   Diagnosis    STEMI (ST elevation myocardial infarction) (CMS HCC)    Coronary artery disease involving native coronary artery of native heart without angina pectoris    Volume overload    Systolic and diastolic CHF, 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   Constitutional:       General: He is not in acute distress.     Appearance: Normal appearance.   HENT:      Head: Normocephalic.      Right Ear: Tympanic membrane normal.      Left Ear: Tympanic membrane normal.      Nose: Nose normal.      Mouth/Throat:      Mouth: Mucous membranes are moist.   Eyes:      General: No scleral icterus.     Extraocular Movements: Extraocular movements intact.       Conjunctiva/sclera: Conjunctivae normal.      Pupils: Pupils are equal, round, and reactive to light.   Neck:      Vascular: No carotid bruit.   Cardiovascular:      Rate and Rhythm: Normal rate and regular rhythm.      Pulses: Normal pulses.           Dorsalis pedis pulses are 2+ on the right side and 2+ on the left side.        Posterior tibial pulses are 2+ on the right side and 2+ on the left side.      Heart sounds: Normal heart sounds.   Pulmonary:      Effort: Pulmonary effort is normal.      Breath sounds: Normal breath sounds. No wheezing, rhonchi or rales.   Abdominal:      General: Abdomen is flat. Bowel sounds are normal.      Tenderness: There is no abdominal tenderness. There is no guarding.   Musculoskeletal:         General: No swelling. Normal range of motion.      Cervical back: Normal range of motion. No tenderness.      Right lower leg: No edema.      Left lower leg: No edema.   Skin:     General: Skin is warm.      Capillary Refill: Capillary refill takes less than 2 seconds.   Neurological:      General: No focal deficit present.      Mental Status: He is alert and oriented to person, place, and time.      Cranial Nerves: No cranial nerve deficit.  Health Maintenance:  Health Maintenance   Topic Date Due    Medicare Annual Wellness Visit  Never done    Influenza Vaccine (1) Never done    Adult Tdap-Td (1 - Tdap) 04/26/2023 (Originally 02[birth year]1972)    Shingles Vaccine (1 of 2) 04/26/2023 (Originally 03/01/2003)    Covid-19 Vaccine (4 - 2023-24 season) 04/26/2023 (Originally 02/18/2022)    Pneumococcal Vaccination, Age 48+ (1 - PCV) 04/26/2023 (Originally 02/28/2018)    Colonoscopy  03/01/2023    Depression Screening  04/26/2023    Meningococcal Vaccine  Aged Out    Hepatitis C screening  Discontinued        Assessment:      ICD-10-CM    1. Knee pain, bilateral  M25.561 predniSONE (DELTASONE) 10 mg Oral Tablet    M25.562       2. Essential hypertension  I10 Blood Pressure today is stable  and will continue current treatment plans       3. 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 Continue current treatment plan as stable.       4. History of atrial fibrillation  Z86.79 Clinically without symptoms or evidence of recurrent disease. Continue current treatment plan as stable.              Orders Placed This Encounter    predniSONE (DELTASONE) 10 mg Oral Tablet              Follow up:  No follow-ups on file.    This note was partially created using voice recognition software and is inherently subject to errors including those of syntax and "sound alike " substitutions which may escape proof reading.  In such instances, original meaning may be extrapolated by contextual derivation.    Lucia Gaskins, DO  05/02/2022, 09:29

## 2022-05-02 NOTE — Addendum Note (Signed)
Addended byTamala Julian, Starlin Steib on: 05/02/2022 05:54 PM     Modules accepted: Orders

## 2022-07-20 ENCOUNTER — Other Ambulatory Visit: Payer: Medicare PPO | Attending: Internal Medicine

## 2022-07-20 ENCOUNTER — Other Ambulatory Visit: Payer: Self-pay

## 2022-07-20 DIAGNOSIS — E538 Deficiency of other specified B group vitamins: Secondary | ICD-10-CM

## 2022-07-20 DIAGNOSIS — Z8679 Personal history of other diseases of the circulatory system: Secondary | ICD-10-CM

## 2022-07-20 DIAGNOSIS — I10 Essential (primary) hypertension: Secondary | ICD-10-CM

## 2022-07-20 DIAGNOSIS — E559 Vitamin D deficiency, unspecified: Secondary | ICD-10-CM | POA: Insufficient documentation

## 2022-07-20 DIAGNOSIS — Z136 Encounter for screening for cardiovascular disorders: Secondary | ICD-10-CM | POA: Insufficient documentation

## 2022-07-20 DIAGNOSIS — Z13220 Encounter for screening for lipoid disorders: Secondary | ICD-10-CM | POA: Insufficient documentation

## 2022-07-20 DIAGNOSIS — R5382 Chronic fatigue, unspecified: Secondary | ICD-10-CM

## 2022-07-20 LAB — URINALYSIS, MACROSCOPIC
BILIRUBIN: NEGATIVE mg/dL
BLOOD: NEGATIVE mg/dL
GLUCOSE: NEGATIVE mg/dL
KETONES: NEGATIVE mg/dL
LEUKOCYTES: NEGATIVE WBCs/uL
NITRITE: NEGATIVE
PH: 6 (ref 5.0–9.0)
PROTEIN: NEGATIVE mg/dL
SPECIFIC GRAVITY: 1.014 (ref 1.002–1.030)
UROBILINOGEN: NORMAL mg/dL

## 2022-07-20 LAB — COMPREHENSIVE METABOLIC PANEL, NON-FASTING
ALBUMIN/GLOBULIN RATIO: 1.6 — ABNORMAL HIGH (ref 0.8–1.4)
ALBUMIN: 4.5 g/dL (ref 3.5–5.7)
ALKALINE PHOSPHATASE: 80 U/L (ref 34–104)
ALT (SGPT): 19 U/L (ref 7–52)
ANION GAP: 7 mmol/L (ref 4–13)
AST (SGOT): 17 U/L (ref 13–39)
BILIRUBIN TOTAL: 0.8 mg/dL (ref 0.3–1.2)
BUN/CREA RATIO: 16 (ref 6–22)
BUN: 18 mg/dL (ref 7–25)
CALCIUM, CORRECTED: 9.1 mg/dL (ref 8.9–10.8)
CALCIUM: 9.5 mg/dL (ref 8.6–10.3)
CHLORIDE: 104 mmol/L (ref 98–107)
CO2 TOTAL: 30 mmol/L (ref 21–31)
CREATININE: 1.13 mg/dL (ref 0.60–1.30)
ESTIMATED GFR: 70 mL/min/{1.73_m2} (ref >59)
GLOBULIN: 2.8 — ABNORMAL LOW (ref 2.9–5.4)
GLUCOSE: 117 mg/dL — ABNORMAL HIGH (ref 74–109)
OSMOLALITY, CALCULATED: 284 mOsm/kg (ref 270–290)
POTASSIUM: 3.8 mmol/L (ref 3.5–5.1)
PROTEIN TOTAL: 7.3 g/dL (ref 6.4–8.9)
SODIUM: 141 mmol/L (ref 136–145)

## 2022-07-20 LAB — URINALYSIS, MICROSCOPIC
BACTERIA: NEGATIVE /hpf
HYALINE CASTS: 7 /lpf — ABNORMAL HIGH (ref <0)
RBCS: 1 /hpf (ref <4)

## 2022-07-20 LAB — LIPID PANEL
CHOL/HDL RATIO: 2.7
CHOLESTEROL: 107 mg/dL (ref <200)
HDL CHOL: 40 mg/dL (ref 23–92)
LDL CALC: 45 mg/dL (ref 0–100)
TRIGLYCERIDES: 110 mg/dL (ref <=150)
VLDL CALC: 22 mg/dL (ref 0–50)

## 2022-07-20 LAB — CBC
HCT: 41.4 % (ref 36.7–47.1)
HGB: 14.1 g/dL (ref 12.5–16.3)
MCH: 32 pg (ref 23.8–33.4)
MCHC: 34.1 g/dL (ref 32.5–36.3)
MCV: 93.8 fL (ref 73.0–96.2)
MPV: 7.9 fL (ref 7.4–11.4)
PLATELETS: 157 10*3/uL (ref 140–440)
RBC: 4.42 10*6/uL (ref 4.06–5.63)
RDW: 13 % (ref 12.1–16.2)
WBC: 5.9 10*3/uL (ref 3.6–10.2)

## 2022-07-20 LAB — VITAMIN B12: VITAMIN B 12: 207 pg/mL (ref 180–914)

## 2022-07-20 LAB — THYROID STIMULATING HORMONE WITH FREE T4 REFLEX: TSH: 0.874 u[IU]/mL (ref 0.450–5.330)

## 2022-07-20 LAB — VITAMIN D 25 TOTAL: VITAMIN D: 21 ng/mL — ABNORMAL LOW (ref 30–100)

## 2022-07-25 ENCOUNTER — Other Ambulatory Visit (HOSPITAL_COMMUNITY): Payer: Self-pay | Admitting: Orthopaedic Surgery

## 2022-07-25 DIAGNOSIS — M1612 Unilateral primary osteoarthritis, left hip: Secondary | ICD-10-CM

## 2022-08-09 ENCOUNTER — Encounter (INDEPENDENT_AMBULATORY_CARE_PROVIDER_SITE_OTHER): Payer: Self-pay | Admitting: Internal Medicine

## 2022-08-09 ENCOUNTER — Other Ambulatory Visit: Payer: Self-pay

## 2022-08-09 ENCOUNTER — Ambulatory Visit (INDEPENDENT_AMBULATORY_CARE_PROVIDER_SITE_OTHER): Payer: Medicare PPO | Admitting: Internal Medicine

## 2022-08-09 VITALS — BP 132/81 | HR 63 | Resp 16 | Ht 68.0 in | Wt 175.0 lb

## 2022-08-09 DIAGNOSIS — R0902 Hypoxemia: Secondary | ICD-10-CM

## 2022-08-09 DIAGNOSIS — I480 Paroxysmal atrial fibrillation: Secondary | ICD-10-CM

## 2022-08-09 DIAGNOSIS — Z955 Presence of coronary angioplasty implant and graft: Secondary | ICD-10-CM

## 2022-08-09 DIAGNOSIS — I2510 Atherosclerotic heart disease of native coronary artery without angina pectoris: Secondary | ICD-10-CM

## 2022-08-09 DIAGNOSIS — N644 Mastodynia: Secondary | ICD-10-CM | POA: Insufficient documentation

## 2022-08-09 DIAGNOSIS — I213 ST elevation (STEMI) myocardial infarction of unspecified site: Secondary | ICD-10-CM

## 2022-08-09 DIAGNOSIS — M25561 Pain in right knee: Secondary | ICD-10-CM | POA: Insufficient documentation

## 2022-08-09 DIAGNOSIS — Z9989 Dependence on other enabling machines and devices: Secondary | ICD-10-CM

## 2022-08-09 DIAGNOSIS — Z8679 Personal history of other diseases of the circulatory system: Secondary | ICD-10-CM

## 2022-08-09 DIAGNOSIS — G4733 Obstructive sleep apnea (adult) (pediatric): Secondary | ICD-10-CM

## 2022-08-09 DIAGNOSIS — I5043 Acute on chronic combined systolic (congestive) and diastolic (congestive) heart failure: Secondary | ICD-10-CM

## 2022-08-09 NOTE — Progress Notes (Signed)
[facility]  [address]  [place] [address]       Name: Brandon Wells. MRN:  [id number]   Date: 08/09/2022 Age: 72 y.o.       Chief Complaint:    Chief Complaint   Patient presents with    Breast pain     Rt    Knee Pain     Rt        HPI:  Brandon Wells. is a 72 y.o. male who presents to the office today for follow-up.  Patient is actually doing relatively well and except for minor issues as described below which I have addressed in total with the patient.        Past Medical History:  Past Medical History:   Diagnosis Date    Allergic rhinitis     Arthritis     Esophageal reflux     Hypercholesterolemia     Hypertension     Myocardial infarction (CMS HCC)     Renal calculi     Sleep apnea          Past Surgical History:   Procedure Laterality Date    ACHILLES TENDON REPAIR Left     HX CORONARY STENT PLACEMENT      HX HERNIA REPAIR Bilateral     inguinal      Current Outpatient Medications   Medication Sig    acetaminophen (TYLENOL ARTHRITIS PAIN) 650 mg Oral Tablet Sustained Release Take 1 Tablet (650 mg total) by mouth Every 8 hours as needed for Pain    aspirin (ECOTRIN) 81 mg Oral Tablet, Delayed Release (E.C.) Take 1 Tablet (81 mg total) by mouth Once a day    atorvastatin (LIPITOR) 80 mg Oral Tablet Take 1 Tablet (80 mg total) by mouth Every night    clopidogreL (PLAVIX) 75 mg Oral Tablet Take 1 Tablet (75 mg total) by mouth Once a day    furosemide (LASIX) 20 mg Oral Tablet Take 1 Tablet (20 mg total) by mouth Once a day    lisinopriL (PRINIVIL) 5 mg Oral Tablet Take 1 Tablet (5 mg total) by mouth Once a day    metoprolol succinate (TOPROL-XL) 25 mg Oral Tablet Sustained Release 24 hr Take 1 Tablet (25 mg total) by mouth Once a day    mometasone (NASONEX) 50 mcg/actuation Nasal Spray, Non-Aerosol Administer 2 Sprays into affected nostril(s) Once per day as needed    pantoprazole (PROTONIX) 40 mg Oral Tablet, Delayed Release (E.C.) Take 1 Tablet (40 mg  total) by mouth Once a day     Allergies   Allergen Reactions    Prednisone  Other Adverse Reaction (Add comment)     Insomnia       Family History:  Family Medical History:       Problem Relation (Age of Onset)    Bone cancer Father    Esophageal cancer Mother    Heart Disease Brother    Lung Cancer Mother              Social History:   Social History     Tobacco Use   Smoking Status Never   Smokeless Tobacco Never     Social History     Substance and Sexual Activity   Alcohol Use Yes    Comment: occasionally     Social History     Occupational History    Not on file       Review  of Systems:  Review of systems as discussed in HPI    Problem List:  Patient Active Problem List   Diagnosis    STEMI (ST elevation myocardial infarction) (CMS HCC)    Coronary artery disease involving native coronary artery of native heart without angina pectoris    Volume overload    Systolic and diastolic CHF, 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   Constitutional:       General: He is not in acute distress.     Appearance: Normal appearance.   HENT:      Head: Normocephalic.      Right Ear: Tympanic membrane normal.      Left Ear: Tympanic membrane normal.      Nose: Nose normal.      Mouth/Throat:      Mouth: Mucous membranes are moist.   Eyes:      General: No scleral icterus.     Extraocular Movements: Extraocular movements intact.      Conjunctiva/sclera: Conjunctivae normal.      Pupils: Pupils are equal, round, and  reactive to light.   Neck:      Vascular: No carotid bruit.   Cardiovascular:      Rate and Rhythm: Normal rate and regular rhythm.      Pulses: Normal pulses.           Dorsalis pedis pulses are 2+ on the right side and 2+ on the left side.        Posterior tibial pulses are 2+ on the right side and 2+ on the left side.      Heart sounds: Normal heart sounds.   Pulmonary:      Effort: Pulmonary effort is normal.      Breath sounds: Normal breath sounds. No wheezing, rhonchi or rales.   Abdominal:      General: Abdomen is flat. Bowel sounds are normal.      Tenderness: There is no abdominal tenderness. There is no guarding.   Musculoskeletal:         General: No swelling. Normal range of motion.      Cervical back: Normal range of motion. No tenderness.      Right lower leg: No edema.      Left lower leg: No edema.   Skin:     General: Skin is warm.      Capillary Refill: Capillary refill takes less than 2 seconds.   Neurological:      General: No focal deficit present.      Mental Status: He is alert and oriented to person, place, and time.      Cranial Nerves: No cranial nerve deficit.        Health Maintenance:  Health Maintenance  Topic Date Due    Influenza Vaccine (1) Never done    Medicare Annual Wellness Visit - Calendar Year [employer]  Never done    Adult Tdap-Td (1 - Tdap) 04/26/2023 (Originally 02[birth year]29[birth year]1972)    Shingles Vaccine (1 of 2) 04/26/2023 (Originally 03/01/2003)    Covid-19 Vaccine (4 - 2023-24 season) 04/26/2023 (Originally 02/18/2022)    Pneumococcal Vaccination, Age 59+ (1 of 1 - PCV) 04/26/2023 (Originally 02/28/2018)    Colonoscopy  03/01/2023    Depression Screening  04/26/2023    Meningococcal Vaccine  Aged Out    Hepatitis C screening  Discontinued        Assessment:      ICD-10-CM    1. Knee pain, right anterior  M25.561 Symptoms are worsening but is stable with current treatment plans and is not wishing any further treatments to be performed at this time.   I have discuss several treatment  options conservative and with medications but the patient wishes no further intervention at this time.       2. 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  We have discussed whether there is need for any therapeutic changes and the patient states that they are tolerating current treatments and will just continue with current lifestyle.       3. PAF (paroxysmal atrial fibrillation) (CMS HCC)  I48.0 Clinically normal sinus rhythm and patient tolerating heart rate well.       4. Coronary artery disease involving native coronary artery of native heart without angina pectoris  I25.10 Clinically without symptoms or evidence of recurrent disease. Continue current treatment plan as stable.       5. History of atrial fibrillation  Z86.79 Clinically normal sinus rhythm and patient tolerating heart rate well.        6. 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 Continue current treatment plan as stable.       7. STEMI (ST elevation myocardial infarction) (CMS HCC)  I21.3       8. Hypoxia  R09.02 Condition stable will continue current therapy.        9. OSA on CPAP  G47.33 The patient continues to use their CPAP every evening and is compliant with its use.  They continue to benefit from CPAP use and has had improve lifestyle with this.       10. Breast pain, left  N64.4 Stop spironolactone         No orders of the defined types were placed in this encounter.        Follow up:  Return in about 3 months (around 11/07/2022).    This note was partially created using voice recognition software and is inherently subject to errors including those of syntax and "sound alike " substitutions which may escape proof reading.  In such instances, original meaning may be extrapolated by contextual derivation.    Nettie Elm, DO  08/09/2022, 13:48

## 2022-08-31 ENCOUNTER — Other Ambulatory Visit (HOSPITAL_COMMUNITY): Payer: Medicare PPO

## 2022-08-31 ENCOUNTER — Other Ambulatory Visit: Payer: Self-pay

## 2022-08-31 ENCOUNTER — Other Ambulatory Visit (HOSPITAL_COMMUNITY): Payer: Self-pay | Admitting: Orthopaedic Surgery

## 2022-08-31 ENCOUNTER — Inpatient Hospital Stay
Admission: RE | Admit: 2022-08-31 | Discharge: 2022-08-31 | Disposition: A | Discharge status: Home / Self Care | Payer: Medicare PPO | Source: Ambulatory Visit | Attending: Orthopaedic Surgery | Admitting: Orthopaedic Surgery

## 2022-08-31 DIAGNOSIS — R001 Bradycardia, unspecified: Secondary | ICD-10-CM

## 2022-08-31 DIAGNOSIS — Z01818 Encounter for other preprocedural examination: Secondary | ICD-10-CM

## 2022-08-31 DIAGNOSIS — R9431 Abnormal electrocardiogram [ECG] [EKG]: Secondary | ICD-10-CM

## 2022-08-31 LAB — COMPREHENSIVE METABOLIC PANEL, NON-FASTING
ALBUMIN/GLOBULIN RATIO: 1.9 — ABNORMAL HIGH (ref 0.8–1.4)
ALBUMIN: 4.4 g/dL (ref 3.5–5.7)
ALKALINE PHOSPHATASE: 86 U/L (ref 34–104)
ALT (SGPT): 18 U/L (ref 7–52)
ANION GAP: 6 mmol/L (ref 4–13)
AST (SGOT): 16 U/L (ref 13–39)
BILIRUBIN TOTAL: 0.7 mg/dL (ref 0.3–1.2)
BUN/CREA RATIO: 17 (ref 6–22)
BUN: 20 mg/dL (ref 7–25)
CALCIUM, CORRECTED: 9 mg/dL (ref 8.9–10.8)
CALCIUM: 9.3 mg/dL (ref 8.6–10.3)
CHLORIDE: 103 mmol/L (ref 98–107)
CO2 TOTAL: 30 mmol/L (ref 21–31)
CREATININE: 1.15 mg/dL (ref 0.60–1.30)
ESTIMATED GFR: 69 mL/min/{1.73_m2} (ref >59)
GLOBULIN: 2.3 — ABNORMAL LOW (ref 2.9–5.4)
GLUCOSE: 112 mg/dL — ABNORMAL HIGH (ref 74–109)
OSMOLALITY, CALCULATED: 281 mOsm/kg (ref 270–290)
POTASSIUM: 3.8 mmol/L (ref 3.5–5.1)
PROTEIN TOTAL: 6.7 g/dL (ref 6.4–8.9)
SODIUM: 139 mmol/L (ref 136–145)

## 2022-08-31 LAB — URINALYSIS, MACROSCOPIC
BILIRUBIN: NEGATIVE mg/dL
BLOOD: NEGATIVE mg/dL
GLUCOSE: NEGATIVE mg/dL
KETONES: NEGATIVE mg/dL
LEUKOCYTES: NEGATIVE WBCs/uL
NITRITE: NEGATIVE
PH: 6.5 (ref 5.0–9.0)
PROTEIN: NEGATIVE mg/dL
SPECIFIC GRAVITY: 1.008 (ref 1.002–1.030)
UROBILINOGEN: NORMAL mg/dL

## 2022-08-31 LAB — ECG 12 LEAD
Atrial Rate: 53 {beats}/min
Calculated P Axis: 29 degrees
Calculated R Axis: 46 degrees
Calculated T Axis: 244 degrees
PR Interval: 168 ms
QRS Duration: 88 ms
QT Interval: 448 ms
QTC Calculation: 420 ms
Ventricular rate: 53 {beats}/min

## 2022-08-31 LAB — HGA1C (HEMOGLOBIN A1C WITH EST AVG GLUCOSE): HEMOGLOBIN A1C: 5.9 % (ref 4.0–6.0)

## 2022-08-31 LAB — URINALYSIS, MICROSCOPIC

## 2022-08-31 LAB — CBC
HCT: 40.2 % (ref 36.7–47.1)
HGB: 14 g/dL (ref 12.5–16.3)
MCH: 32.7 pg (ref 23.8–33.4)
MCHC: 34.8 g/dL (ref 32.5–36.3)
MCV: 93.9 fL (ref 73.0–96.2)
MPV: 7.8 fL (ref 7.4–11.4)
PLATELETS: 146 10*3/uL (ref 140–440)
RBC: 4.28 10*6/uL (ref 4.06–5.63)
RDW: 13 % (ref 12.1–16.2)
WBC: 4.7 10*3/uL (ref 3.6–10.2)

## 2022-09-02 ENCOUNTER — Other Ambulatory Visit (HOSPITAL_COMMUNITY): Payer: Medicare PPO

## 2022-09-13 ENCOUNTER — Other Ambulatory Visit: Payer: Self-pay

## 2022-09-13 ENCOUNTER — Inpatient Hospital Stay
Admission: RE | Admit: 2022-09-13 | Discharge: 2022-09-13 | Disposition: A | Discharge status: Home / Self Care | Payer: Medicare PPO | Source: Ambulatory Visit | Attending: Orthopaedic Surgery | Admitting: Orthopaedic Surgery

## 2022-09-13 DIAGNOSIS — M1612 Unilateral primary osteoarthritis, left hip: Secondary | ICD-10-CM | POA: Insufficient documentation

## 2022-09-23 ENCOUNTER — Encounter (HOSPITAL_COMMUNITY): Payer: Self-pay | Admitting: Orthopaedic Surgery

## 2022-09-28 ENCOUNTER — Inpatient Hospital Stay (HOSPITAL_COMMUNITY): Payer: Medicare PPO | Admitting: Anesthesiology

## 2022-09-28 ENCOUNTER — Observation Stay
Admission: RE | Admit: 2022-09-28 | Discharge: 2022-09-29 | Disposition: A | Discharge status: Home / Self Care | Payer: Medicare PPO | Source: Ambulatory Visit | Attending: Orthopaedic Surgery | Admitting: Orthopaedic Surgery

## 2022-09-28 ENCOUNTER — Encounter (HOSPITAL_COMMUNITY): Payer: Self-pay | Admitting: Orthopaedic Surgery

## 2022-09-28 ENCOUNTER — Observation Stay (HOSPITAL_BASED_OUTPATIENT_CLINIC_OR_DEPARTMENT_OTHER): Payer: Medicare PPO

## 2022-09-28 ENCOUNTER — Other Ambulatory Visit: Payer: Self-pay

## 2022-09-28 ENCOUNTER — Observation Stay (HOSPITAL_COMMUNITY): Payer: Medicare PPO | Admitting: Orthopaedic Surgery

## 2022-09-28 ENCOUNTER — Encounter (HOSPITAL_COMMUNITY)
Admission: RE | Disposition: A | Discharge status: Home / Self Care | Payer: Self-pay | Source: Ambulatory Visit | Attending: Orthopaedic Surgery

## 2022-09-28 DIAGNOSIS — I2510 Atherosclerotic heart disease of native coronary artery without angina pectoris: Secondary | ICD-10-CM | POA: Insufficient documentation

## 2022-09-28 DIAGNOSIS — Z955 Presence of coronary angioplasty implant and graft: Secondary | ICD-10-CM | POA: Insufficient documentation

## 2022-09-28 DIAGNOSIS — K219 Gastro-esophageal reflux disease without esophagitis: Secondary | ICD-10-CM | POA: Insufficient documentation

## 2022-09-28 DIAGNOSIS — Z96641 Presence of right artificial hip joint: Secondary | ICD-10-CM | POA: Diagnosis present

## 2022-09-28 DIAGNOSIS — M25752 Osteophyte, left hip: Secondary | ICD-10-CM | POA: Insufficient documentation

## 2022-09-28 DIAGNOSIS — Z87442 Personal history of urinary calculi: Secondary | ICD-10-CM | POA: Insufficient documentation

## 2022-09-28 DIAGNOSIS — G4730 Sleep apnea, unspecified: Secondary | ICD-10-CM | POA: Insufficient documentation

## 2022-09-28 DIAGNOSIS — I480 Paroxysmal atrial fibrillation: Secondary | ICD-10-CM | POA: Insufficient documentation

## 2022-09-28 DIAGNOSIS — M1612 Unilateral primary osteoarthritis, left hip: Principal | ICD-10-CM | POA: Insufficient documentation

## 2022-09-28 DIAGNOSIS — E7800 Pure hypercholesterolemia, unspecified: Secondary | ICD-10-CM | POA: Insufficient documentation

## 2022-09-28 DIAGNOSIS — E785 Hyperlipidemia, unspecified: Secondary | ICD-10-CM | POA: Insufficient documentation

## 2022-09-28 DIAGNOSIS — I110 Hypertensive heart disease with heart failure: Secondary | ICD-10-CM | POA: Insufficient documentation

## 2022-09-28 DIAGNOSIS — I509 Heart failure, unspecified: Secondary | ICD-10-CM | POA: Insufficient documentation

## 2022-09-28 DIAGNOSIS — I252 Old myocardial infarction: Secondary | ICD-10-CM | POA: Insufficient documentation

## 2022-09-28 DIAGNOSIS — Z79899 Other long term (current) drug therapy: Secondary | ICD-10-CM | POA: Insufficient documentation

## 2022-09-28 DIAGNOSIS — Z7902 Long term (current) use of antithrombotics/antiplatelets: Secondary | ICD-10-CM | POA: Insufficient documentation

## 2022-09-28 HISTORY — DX: Localized edema: R60.0

## 2022-09-28 HISTORY — DX: Hyperlipidemia, unspecified: E78.5

## 2022-09-28 HISTORY — DX: Personal history of other venous thrombosis and embolism: Z86.718

## 2022-09-28 HISTORY — DX: Presence of coronary angioplasty implant and graft: Z95.5

## 2022-09-28 HISTORY — DX: Presence of spectacles and contact lenses: Z97.3

## 2022-09-28 HISTORY — DX: Dependence on other enabling machines and devices: Z99.89

## 2022-09-28 HISTORY — PX: HX HIP REPLACEMENT: SHX124

## 2022-09-28 HISTORY — DX: Dorsopathy, unspecified: M53.9

## 2022-09-28 SURGERY — ARTHROPLASTY HIP ANTERIOR TOTAL MAKO ROBOTIC ASSISTED
Anesthesia: General | Site: Hip | Laterality: Left | Wound class: Clean Wound: Uninfected operative wounds in which no inflammation occurred

## 2022-09-28 MED ORDER — FENTANYL (PF) 50 MCG/ML INJECTION WRAPPER
50.0000 ug | INJECTION | INTRAMUSCULAR | Status: DC | PRN
Start: 2022-09-28 — End: 2022-09-28
  Administered 2022-09-28: 50 ug via INTRAVENOUS

## 2022-09-28 MED ORDER — ASPIRIN 81 MG CHEWABLE TABLET
81.0000 mg | CHEWABLE_TABLET | Freq: Two times a day (BID) | ORAL | Status: DC
Start: 2022-09-29 — End: 2022-09-29
  Administered 2022-09-29: 81 mg via ORAL
  Filled 2022-09-28: qty 1

## 2022-09-28 MED ORDER — ALBUTEROL SULFATE 2.5 MG/3 ML (0.083 %) SOLUTION FOR NEBULIZATION
2.5000 mg | INHALATION_SOLUTION | Freq: Once | RESPIRATORY_TRACT | Status: DC | PRN
Start: 2022-09-28 — End: 2022-09-28

## 2022-09-28 MED ORDER — GLYCOPYRROLATE 0.2 MG/ML INJECTION SOLUTION
Freq: Once | INTRAMUSCULAR | Status: DC | PRN
Start: 2022-09-28 — End: 2022-09-28
  Administered 2022-09-28: .2 mg via INTRAVENOUS

## 2022-09-28 MED ORDER — CEFAZOLIN 1 GRAM SOLUTION FOR INJECTION
INTRAMUSCULAR | Status: AC
Start: 2022-09-28 — End: 2022-09-28
  Filled 2022-09-28: qty 20

## 2022-09-28 MED ORDER — CHOLECALCIFEROL (VITAMIN D3) 25 MCG (1,000 UNIT) TABLET
1000.0000 [IU] | ORAL_TABLET | Freq: Every day | ORAL | Status: DC
Start: 2022-09-28 — End: 2022-09-29
  Administered 2022-09-28: 0 [IU] via ORAL
  Administered 2022-09-29: 1000 [IU] via ORAL
  Filled 2022-09-28: qty 1

## 2022-09-28 MED ORDER — APREPITANT 40 MG CAPSULE
ORAL_CAPSULE | ORAL | Status: AC
Start: 2022-09-28 — End: 2022-09-28
  Filled 2022-09-28: qty 1

## 2022-09-28 MED ORDER — IPRATROPIUM 0.5 MG-ALBUTEROL 3 MG (2.5 MG BASE)/3 ML NEBULIZATION SOLN
3.0000 mL | INHALATION_SOLUTION | Freq: Once | RESPIRATORY_TRACT | Status: DC | PRN
Start: 2022-09-28 — End: 2022-09-28

## 2022-09-28 MED ORDER — SUGAMMADEX 100 MG/ML INTRAVENOUS SOLUTION
Freq: Once | INTRAVENOUS | Status: DC | PRN
Start: 2022-09-28 — End: 2022-09-28
  Administered 2022-09-28: 500 mg via INTRAVENOUS

## 2022-09-28 MED ORDER — FUROSEMIDE 20 MG TABLET
20.0000 mg | ORAL_TABLET | Freq: Every day | ORAL | Status: DC
Start: 2022-09-28 — End: 2022-09-29
  Administered 2022-09-28: 0 mg via ORAL
  Administered 2022-09-29: 20 mg via ORAL
  Filled 2022-09-28: qty 1

## 2022-09-28 MED ORDER — LACTATED RINGERS INTRAVENOUS SOLUTION
INTRAVENOUS | Status: DC
Start: 2022-09-28 — End: 2022-09-29

## 2022-09-28 MED ORDER — FENTANYL (PF) 50 MCG/ML INJECTION SOLUTION
INTRAMUSCULAR | Status: AC
Start: 2022-09-28 — End: 2022-09-28
  Filled 2022-09-28: qty 2

## 2022-09-28 MED ORDER — PROCHLORPERAZINE EDISYLATE 10 MG/2 ML (5 MG/ML) INJECTION SOLUTION
5.0000 mg | Freq: Once | INTRAMUSCULAR | Status: DC | PRN
Start: 2022-09-28 — End: 2022-09-28

## 2022-09-28 MED ORDER — OXYCODONE-ACETAMINOPHEN 5 MG-325 MG TABLET
1.0000 | ORAL_TABLET | ORAL | Status: DC | PRN
Start: 2022-09-28 — End: 2022-09-29

## 2022-09-28 MED ORDER — TRAMADOL 50 MG TABLET
50.0000 mg | ORAL_TABLET | Freq: Four times a day (QID) | ORAL | Status: DC
Start: 2022-09-28 — End: 2022-09-29
  Administered 2022-09-28 – 2022-09-29 (×3): 0 mg via ORAL
  Administered 2022-09-29: 50 mg via ORAL
  Filled 2022-09-28 (×2): qty 1

## 2022-09-28 MED ORDER — SODIUM CHLORIDE 0.9 % (FLUSH) INJECTION SYRINGE
3.0000 mL | INJECTION | INTRAMUSCULAR | Status: DC | PRN
Start: 2022-09-28 — End: 2022-09-29

## 2022-09-28 MED ORDER — LISINOPRIL 5 MG TABLET
5.0000 mg | ORAL_TABLET | Freq: Every day | ORAL | Status: DC
Start: 2022-09-28 — End: 2022-09-29
  Administered 2022-09-28: 0 mg via ORAL
  Administered 2022-09-29: 5 mg via ORAL
  Filled 2022-09-28: qty 1

## 2022-09-28 MED ORDER — SODIUM CHLORIDE 0.9 % (FLUSH) INJECTION SYRINGE
3.0000 mL | INJECTION | Freq: Three times a day (TID) | INTRAMUSCULAR | Status: DC
Start: 2022-09-28 — End: 2022-09-29
  Administered 2022-09-28 – 2022-09-29 (×2): 0 mL

## 2022-09-28 MED ORDER — ACETAMINOPHEN 325 MG TABLET
975.0000 mg | ORAL_TABLET | Freq: Once | ORAL | Status: AC
Start: 2022-09-28 — End: 2022-09-28
  Administered 2022-09-28: 975 mg via ORAL

## 2022-09-28 MED ORDER — ACETAMINOPHEN 325 MG TABLET
650.0000 mg | ORAL_TABLET | ORAL | Status: DC | PRN
Start: 2022-09-28 — End: 2022-09-29

## 2022-09-28 MED ORDER — CELECOXIB 100 MG CAPSULE
200.0000 mg | ORAL_CAPSULE | Freq: Once | ORAL | Status: AC
Start: 2022-09-28 — End: 2022-09-28
  Administered 2022-09-28: 200 mg via ORAL

## 2022-09-28 MED ORDER — ROCURONIUM 10 MG/ML INTRAVENOUS SOLUTION
Freq: Once | INTRAVENOUS | Status: DC | PRN
Start: 2022-09-28 — End: 2022-09-28
  Administered 2022-09-28: 50 mg via INTRAVENOUS

## 2022-09-28 MED ORDER — LIDOCAINE (PF) 100 MG/5 ML (2 %) INTRAVENOUS SYRINGE
INJECTION | Freq: Once | INTRAVENOUS | Status: DC | PRN
Start: 2022-09-28 — End: 2022-09-28
  Administered 2022-09-28: 80 mg via INTRAVENOUS

## 2022-09-28 MED ORDER — CEFAZOLIN 1 GRAM SOLUTION FOR INJECTION
2.0000 g | Freq: Once | INTRAMUSCULAR | Status: AC
Start: 2022-09-28 — End: 2022-09-28
  Administered 2022-09-28: 2 g via INTRAVENOUS

## 2022-09-28 MED ORDER — SODIUM CHLORIDE 0.9 % INTRAVENOUS PIGGYBACK
1000.0000 mg | INJECTION | Freq: Once | INTRAVENOUS | Status: AC
Start: 2022-09-28 — End: 2022-09-28
  Administered 2022-09-28: 1000 mg via INTRAVENOUS

## 2022-09-28 MED ORDER — MORPHINE 2 MG/ML INJECTION WRAPPER
2.0000 mg | INJECTION | INTRAMUSCULAR | Status: DC | PRN
Start: 2022-09-28 — End: 2022-09-29

## 2022-09-28 MED ORDER — SODIUM CHLORIDE 0.9 % (FLUSH) INJECTION SYRINGE
3.0000 mL | INJECTION | INTRAMUSCULAR | Status: DC | PRN
Start: 2022-09-28 — End: 2022-09-28

## 2022-09-28 MED ORDER — APREPITANT 40 MG CAPSULE
40.0000 mg | ORAL_CAPSULE | Freq: Once | ORAL | Status: AC
Start: 2022-09-28 — End: 2022-09-28
  Administered 2022-09-28: 40 mg via ORAL

## 2022-09-28 MED ORDER — GABAPENTIN 300 MG CAPSULE
ORAL_CAPSULE | ORAL | Status: AC
Start: 2022-09-28 — End: 2022-09-28
  Filled 2022-09-28: qty 1

## 2022-09-28 MED ORDER — METOPROLOL SUCCINATE ER 50 MG TABLET,EXTENDED RELEASE 24 HR
25.0000 mg | ORAL_TABLET | ORAL | Status: AC
Start: 2022-09-28 — End: 2022-09-28
  Administered 2022-09-28: 25 mg via ORAL

## 2022-09-28 MED ORDER — FAMOTIDINE (PF) 20 MG/2 ML INTRAVENOUS SOLUTION
20.0000 mg | Freq: Once | INTRAVENOUS | Status: AC
Start: 2022-09-28 — End: 2022-09-28
  Administered 2022-09-28: 20 mg via INTRAVENOUS

## 2022-09-28 MED ORDER — DOCUSATE SODIUM 100 MG CAPSULE
100.0000 mg | ORAL_CAPSULE | Freq: Two times a day (BID) | ORAL | Status: DC
Start: 2022-09-28 — End: 2022-09-29
  Administered 2022-09-28: 100 mg via ORAL
  Administered 2022-09-28: 0 mg via ORAL
  Administered 2022-09-29: 100 mg via ORAL
  Filled 2022-09-28 (×2): qty 1

## 2022-09-28 MED ORDER — ROPIVACAINE (PF) 2 MG/ML (0.2 %) INJECTION SOLUTION
INTRAMUSCULAR | Status: AC
Start: 2022-09-28 — End: 2022-09-28
  Filled 2022-09-28: qty 10

## 2022-09-28 MED ORDER — FENTANYL (PF) 50 MCG/ML INJECTION WRAPPER
INJECTION | Freq: Once | INTRAMUSCULAR | Status: DC | PRN
Start: 2022-09-28 — End: 2022-09-28
  Administered 2022-09-28 (×2): 50 ug via INTRAVENOUS

## 2022-09-28 MED ORDER — ACETAMINOPHEN 325 MG TABLET
ORAL_TABLET | ORAL | Status: AC
Start: 2022-09-28 — End: 2022-09-28
  Filled 2022-09-28: qty 3

## 2022-09-28 MED ORDER — PROPOFOL 10 MG/ML IV BOLUS
INJECTION | Freq: Once | INTRAVENOUS | Status: DC | PRN
Start: 2022-09-28 — End: 2022-09-28
  Administered 2022-09-28: 50 mg via INTRAVENOUS
  Administered 2022-09-28: 150 mg via INTRAVENOUS

## 2022-09-28 MED ORDER — ONDANSETRON HCL (PF) 4 MG/2 ML INJECTION SOLUTION
4.0000 mg | INTRAMUSCULAR | Status: DC | PRN
Start: 2022-09-28 — End: 2022-09-29

## 2022-09-28 MED ORDER — BISACODYL 10 MG RECTAL SUPPOSITORY
10.0000 mg | Freq: Every day | RECTAL | Status: DC | PRN
Start: 2022-09-28 — End: 2022-09-29
  Filled 2022-09-28: qty 1

## 2022-09-28 MED ORDER — GABAPENTIN 300 MG CAPSULE
300.0000 mg | ORAL_CAPSULE | Freq: Once | ORAL | Status: AC
Start: 2022-09-28 — End: 2022-09-28
  Administered 2022-09-28: 300 mg via ORAL

## 2022-09-28 MED ORDER — CLOPIDOGREL 75 MG TABLET
75.0000 mg | ORAL_TABLET | Freq: Every day | ORAL | Status: DC
Start: 2022-09-28 — End: 2022-09-29
  Administered 2022-09-28: 0 mg via ORAL
  Administered 2022-09-29: 75 mg via ORAL
  Filled 2022-09-28 (×2): qty 1

## 2022-09-28 MED ORDER — NALOXONE 0.4 MG/ML INJECTION SOLUTION
0.4000 mg | INTRAMUSCULAR | Status: DC | PRN
Start: 2022-09-28 — End: 2022-09-29

## 2022-09-28 MED ORDER — CELECOXIB 100 MG CAPSULE
ORAL_CAPSULE | ORAL | Status: AC
Start: 2022-09-28 — End: 2022-09-28
  Filled 2022-09-28: qty 2

## 2022-09-28 MED ORDER — OXYCODONE ER 10 MG TABLET,CRUSH RESISTANT,EXTENDED RELEASE 12 HR
10.0000 mg | EXTENDED_RELEASE_ORAL_TABLET | Freq: Two times a day (BID) | ORAL | Status: DC
Start: 2022-09-28 — End: 2022-09-29
  Administered 2022-09-28 (×2): 10 mg via ORAL
  Filled 2022-09-28: qty 1

## 2022-09-28 MED ORDER — SODIUM CHLORIDE 0.9 % INTRAVENOUS PIGGYBACK
INJECTION | INTRAVENOUS | Status: AC
Start: 2022-09-28 — End: 2022-09-28
  Filled 2022-09-28: qty 100

## 2022-09-28 MED ORDER — METOPROLOL SUCCINATE ER 50 MG TABLET,EXTENDED RELEASE 24 HR
ORAL_TABLET | ORAL | Status: AC
Start: 2022-09-28 — End: 2022-09-28
  Filled 2022-09-28: qty 1

## 2022-09-28 MED ORDER — ONDANSETRON HCL (PF) 4 MG/2 ML INJECTION SOLUTION
4.0000 mg | Freq: Once | INTRAMUSCULAR | Status: DC | PRN
Start: 2022-09-28 — End: 2022-09-28

## 2022-09-28 MED ORDER — ONDANSETRON HCL (PF) 4 MG/2 ML INJECTION SOLUTION
INTRAMUSCULAR | Status: AC
Start: 2022-09-28 — End: 2022-09-28
  Filled 2022-09-28: qty 4

## 2022-09-28 MED ORDER — ACETAMINOPHEN 325 MG TABLET
975.0000 mg | ORAL_TABLET | Freq: Four times a day (QID) | ORAL | Status: AC
Start: 2022-09-28 — End: 2022-09-28
  Administered 2022-09-28 (×2): 975 mg via ORAL
  Filled 2022-09-28 (×2): qty 3

## 2022-09-28 MED ORDER — FAMOTIDINE 20 MG TABLET
20.0000 mg | ORAL_TABLET | Freq: Two times a day (BID) | ORAL | Status: DC
Start: 2022-09-28 — End: 2022-09-29
  Administered 2022-09-28: 0 mg via ORAL
  Administered 2022-09-28 – 2022-09-29 (×2): 20 mg via ORAL
  Filled 2022-09-28 (×2): qty 1

## 2022-09-28 MED ORDER — ETHYL ALCOHOL 62 % (NOZIN NASAL SANITIZER) NASAL SOLUTION - BULK BOTTLE
1.0000 | Freq: Three times a day (TID) | NASAL | Status: DC
Start: 2022-09-28 — End: 2022-09-29
  Administered 2022-09-28 – 2022-09-29 (×3): 1 via NASAL

## 2022-09-28 MED ORDER — FENTANYL (PF) 50 MCG/ML INJECTION WRAPPER
25.0000 ug | INJECTION | INTRAMUSCULAR | Status: DC | PRN
Start: 2022-09-28 — End: 2022-09-28

## 2022-09-28 MED ORDER — SODIUM CHLORIDE 0.9 % INTRAVENOUS SOLUTION
INTRAVENOUS | Status: DC
Start: 2022-09-28 — End: 2022-09-29

## 2022-09-28 MED ORDER — METOPROLOL SUCCINATE ER 50 MG TABLET,EXTENDED RELEASE 24 HR
25.0000 mg | ORAL_TABLET | Freq: Every day | ORAL | Status: DC
Start: 2022-09-28 — End: 2022-09-29
  Administered 2022-09-28 – 2022-09-29 (×2): 0 mg via ORAL
  Filled 2022-09-28: qty 1

## 2022-09-28 MED ORDER — DEXAMETHASONE SODIUM PHOSPHATE (PF) 10 MG/ML INJECTION SOLUTION
10.0000 mg | Freq: Once | INTRAMUSCULAR | Status: AC
Start: 2022-09-28 — End: 2022-09-28
  Administered 2022-09-28: 10 mg via INTRAVENOUS

## 2022-09-28 MED ORDER — MIDAZOLAM 5 MG/ML INJECTION WRAPPER
INTRAMUSCULAR | Status: AC
Start: 2022-09-28 — End: 2022-09-28
  Filled 2022-09-28: qty 1

## 2022-09-28 MED ORDER — MIDAZOLAM 5 MG/ML INJECTION WRAPPER
1.0000 mg | Freq: Once | INTRAMUSCULAR | Status: DC | PRN
Start: 2022-09-28 — End: 2022-09-28
  Administered 2022-09-28: 1 mg via INTRAVENOUS

## 2022-09-28 MED ORDER — OXYCODONE ER 10 MG TABLET,CRUSH RESISTANT,EXTENDED RELEASE 12 HR
EXTENDED_RELEASE_ORAL_TABLET | ORAL | Status: AC
Start: 2022-09-28 — End: 2022-09-28
  Filled 2022-09-28: qty 1

## 2022-09-28 MED ORDER — TRANEXAMIC ACID 1,000 MG/10 ML (100 MG/ML) INTRAVENOUS SOLUTION
INTRAVENOUS | Status: AC
Start: 2022-09-28 — End: 2022-09-28
  Filled 2022-09-28: qty 10

## 2022-09-28 MED ORDER — DEXAMETHASONE SODIUM PHOSPHATE (PF) 10 MG/ML INJECTION SOLUTION
8.0000 mg | Freq: Two times a day (BID) | INTRAMUSCULAR | Status: AC
Start: 2022-09-28 — End: 2022-09-29
  Administered 2022-09-28 – 2022-09-29 (×2): 8 mg via INTRAVENOUS
  Filled 2022-09-28 (×2): qty 1

## 2022-09-28 MED ORDER — FAMOTIDINE (PF) 20 MG/2 ML INTRAVENOUS SOLUTION
INTRAVENOUS | Status: AC
Start: 2022-09-28 — End: 2022-09-28
  Filled 2022-09-28: qty 2

## 2022-09-28 MED ORDER — KETOROLAC 30 MG/ML (1 ML) INJECTION SOLUTION
15.0000 mg | Freq: Three times a day (TID) | INTRAMUSCULAR | Status: DC
Start: 2022-09-28 — End: 2022-09-29
  Administered 2022-09-28: 15 mg via INTRAVENOUS
  Administered 2022-09-28: 0 mg via INTRAVENOUS
  Administered 2022-09-29: 15 mg via INTRAVENOUS
  Filled 2022-09-28 (×2): qty 1

## 2022-09-28 MED ORDER — OXYCODONE-ACETAMINOPHEN 5 MG-325 MG TABLET
2.0000 | ORAL_TABLET | ORAL | Status: DC | PRN
Start: 2022-09-28 — End: 2022-09-29

## 2022-09-28 MED ORDER — ROPIVACAINE (PF) 2 MG/ML (0.2 %) INJECTION SOLUTION
Freq: Once | INTRAMUSCULAR | Status: DC | PRN
Start: 2022-09-28 — End: 2022-09-28
  Administered 2022-09-28: 10 mL

## 2022-09-28 MED ORDER — DEXAMETHASONE SODIUM PHOSPHATE (PF) 10 MG/ML INJECTION SOLUTION
INTRAMUSCULAR | Status: AC
Start: 2022-09-28 — End: 2022-09-28
  Filled 2022-09-28: qty 1

## 2022-09-28 MED ORDER — HYDROMORPHONE 2 MG/ML INJECTION WRAPPER
0.2000 mg | INJECTION | INTRAMUSCULAR | Status: DC | PRN
Start: 2022-09-28 — End: 2022-09-29

## 2022-09-28 MED ORDER — LACTATED RINGERS INTRAVENOUS SOLUTION
INTRAVENOUS | Status: DC
Start: 2022-09-28 — End: 2022-09-28

## 2022-09-28 MED ORDER — FERROUS SULFATE 324 MG (65 MG IRON) TABLET,DELAYED RELEASE
324.0000 mg | DELAYED_RELEASE_TABLET | Freq: Every morning | ORAL | Status: DC
Start: 2022-09-29 — End: 2022-09-29
  Administered 2022-09-29: 324 mg via ORAL
  Filled 2022-09-28: qty 1

## 2022-09-28 MED ORDER — ETHYL ALCOHOL 62 % (NOZIN NASAL SANITIZER) NASAL SOLUTION - BULK BOTTLE
1.0000 | Freq: Once | NASAL | Status: AC
Start: 2022-09-28 — End: 2022-09-28
  Administered 2022-09-28: 1 via NASAL

## 2022-09-28 MED ORDER — ONDANSETRON HCL (PF) 4 MG/2 ML INJECTION SOLUTION
8.0000 mg | Freq: Once | INTRAMUSCULAR | Status: AC
Start: 2022-09-28 — End: 2022-09-28
  Administered 2022-09-28: 8 mg via INTRAVENOUS

## 2022-09-28 MED ORDER — MULTIVITAMIN WITH FOLIC ACID 400 MCG TABLET
1.0000 | ORAL_TABLET | Freq: Every day | ORAL | Status: DC
Start: 2022-09-28 — End: 2022-09-29
  Administered 2022-09-28: 0 via ORAL
  Administered 2022-09-29: 1 via ORAL
  Filled 2022-09-28: qty 1

## 2022-09-28 MED ORDER — SODIUM CHLORIDE 0.9 % (FLUSH) INJECTION SYRINGE
3.0000 mL | INJECTION | Freq: Three times a day (TID) | INTRAMUSCULAR | Status: DC
Start: 2022-09-28 — End: 2022-09-28

## 2022-09-28 SURGICAL SUPPLY — 63 items
BLADE 10 2 END CBNSTL SURG STRL DISP (SURGICAL CUTTING SUPPLIES) ×5 IMPLANT
BLADE SAW 3.543X.828IN SGTL THK.05IN STRYK PRFRM SER STERNUM SYS 6 (SURGICAL CUTTING SUPPLIES) ×1 IMPLANT
BLADE SAW 73X25MM 2 CUT SGTL SS THK.89MM MED W STRL LF (SURGICAL CUTTING SUPPLIES) IMPLANT
BLADE SAW STD MAKO STRL LF  DISP (SURGICAL CUTTING SUPPLIES) IMPLANT
CONV USE 31829 - NEEDLE HYPO  18GA 1.5IN STD REG BVL LF (MED SURG SUPPLIES) ×1 IMPLANT
CONV USE 86875 - ELECTRODE ESURG BLADE 6IN 3/32IN SS LF (SURGICAL CUTTING SUPPLIES) IMPLANT
CONV USE ITEM 338662 - PACK SURG ASCP STRL DISP ~~LOC~~ BPT MED CNTR LF (CUSTOM TRAYS & PACK) ×1 IMPLANT
COUNTER 20 CNT BLOCK ADH NEEDLE STRL LF  RD SHARP FOAM 15.75X11.5X14IN DISP (MED SURG SUPPLIES) ×1 IMPLANT
COVER TBL 90X50IN STD SMS REINF FNFLD STRL LF  DISP (DRAPE/PACKS/SHEETS/OR TOWEL) ×4 IMPLANT
DEVICE SUCT 2 FILTER CHAMBER SCKR STRL LF  DISP (MED SURG SUPPLIES) ×1 IMPLANT
DISCONTINUED NO SUB - SYS SKIN CLOSURE EXOFINFUSION PREMIERPRO 30CM ADH MESH MICROBIAL BRRER STER LF (SUTURE AIDS) ×1 IMPLANT
DRAPE 2 INCS FILM ANTIMIC 33X23IN IOBN STRL SURG (DRAPE/PACKS/SHEETS/OR TOWEL) ×1 IMPLANT
DRAPE FNFLD ABS REINF 77X53IN 43528 PRXM LF  STRL DISP SURG SMS 44X23IN (DRAPE/PACKS/SHEETS/OR TOWEL) ×3 IMPLANT
DRESS COMPRESS 13FTX6IN JNS COTTON RL HI ABS LF  STRL .5LB (WOUND CARE SUPPLY) ×2 IMPLANT
DRESS SECURE 2.75X2.5IN TGDRM IV FILM TAPE STRP STRL LF (IV TUBING & ACCESSORIES) ×1 IMPLANT
ELECTRODE ESURG BLADE 6IN 3/32IN SS LF (CUTTING ELEMENTS)
ELECTRODE ESURG BLADE PNCL 15FT VLAB EDGE TELESCP SMOKE EVAC (SURGICAL CUTTING SUPPLIES) ×1 IMPLANT
GLOVE SURG 6 LF  PF BEAD CUF STRL CRM 11.3IN PROTEXIS PLISPRN THK9.1 MIL (GLOVES AND ACCESSORIES) IMPLANT
GLOVE SURG 6 LF  PF SMOOTH BEAD CUF INTLK STRL BLU 11.3IN PROTEXIS NEU-THERA PLISPRN THK7.9 MIL (GLOVES AND ACCESSORIES) IMPLANT
GLOVE SURG 6.5 LF  PF BEAD CUF STRL CRM 11.3IN PROTEXIS PI PLISPRN THK9.1 MIL (GLOVES AND ACCESSORIES) IMPLANT
GLOVE SURG 6.5 LF  PF SMOOTH BEAD CUF INTLK STRL BLU 11.3IN PROTEXIS NEU-THERA PLISPRN THK7.9 MIL (GLOVES AND ACCESSORIES) ×2 IMPLANT
GLOVE SURG 6.5 LTX PF SMOOTH BEAD CUF STRL YW 11.5IN PROTEXIS NEU-THERA DDRGL THK8.7 MIL (GLOVES AND ACCESSORIES) ×1 IMPLANT
GLOVE SURG 7 LF  PF BEAD CUF STRL CRM 11.8IN PROTEXIS PI PLISPRN THK9.1 MIL (GLOVES AND ACCESSORIES) IMPLANT
GLOVE SURG 7 LF  PF SMOOTH BEAD CUF INTLK STRL BLU 11.8IN PROTEXIS NEU-THERA PLISPRN THK7.9 MIL (GLOVES AND ACCESSORIES) ×1 IMPLANT
GLOVE SURG 7 LTX PF SMOOTH BEAD CUF STRL YW 12IN PROTEXIS NEU-THERA DDRGL THK8.7 MIL (GLOVES AND ACCESSORIES) IMPLANT
GLOVE SURG 7.5 LF  PF BEAD CUF STRL CRM 11.8IN PROTEXIS PI PLISPRN THK9.1 MIL (GLOVES AND ACCESSORIES) ×1 IMPLANT
GLOVE SURG 7.5 LF  PF SMOOTH BEAD CUF INTLK STRL BLU 11.8IN PROTEXIS NEU-THERA PLISPRN THK7.9 MIL (GLOVES AND ACCESSORIES) IMPLANT
GLOVE SURG 7.5 LTX PF SMOOTH BEAD CUF STRL YW 12IN PROTEXIS (GLOVES AND ACCESSORIES) ×2 IMPLANT
GLOVE SURG 8 LF  PF BEAD CUF STRL CRM 11.8IN PROTEXIS PI PLISPRN THK9.1 MIL (GLOVES AND ACCESSORIES) IMPLANT
GLOVE SURG 8 LF  PF SMOOTH BEAD CUF INTLK STRL BLU 11.8IN PROTEXIS NEU-THERA PLISPRN THK7.9 MIL (GLOVES AND ACCESSORIES) IMPLANT
GLOVE SURG 8 LTX PF SMOOTH BEAD CUF STRL YW 12IN PROTEXIS NEU-THERA DDRGL THK8.7 MIL (GLOVES AND ACCESSORIES) ×1 IMPLANT
GLOVE SURG 8.5 LF  PF BEAD CUF STRL CRM 11.8IN PROTEXIS PI PLISPRN THK9.1 MIL (GLOVES AND ACCESSORIES) IMPLANT
GOWN SURG LRG STD LGTH REG L3 NONREINFORCE BRTHBL TWL STRL LF  DISP BLU HALYARD SPECTRUM SMS (DRAPE/PACKS/SHEETS/OR TOWEL) ×1 IMPLANT
GOWN SURG XL XLNG L4 REINF HKLP CLSR SET IN SLEEVE STRL LF  DISP BLU SIRUS SMS PE 56IN (DRAPE/PACKS/SHEETS/OR TOWEL) ×1 IMPLANT
HEAD V40 36MM +0MM OFST TAPER HIP BLX D FEM STRL (IMPLANTS HIP) ×1 IMPLANT
HEMOSTAT ABS 14X2IN FLXB SHR W_V SRGCL STRL DISP (WOUND CARE SUPPLY) ×1 IMPLANT
INSERT ACET TRIDENT 36MM 10D E X3 HIP (IMPLANTS HIP) ×1 IMPLANT
INSIGNIA HIP STEM HIGH OFFSET #5 ×1 IMPLANT
KIT 1 PC PCKT DRP RIO RIO (DRAPE/PACKS/SHEETS/OR TOWEL) ×1 IMPLANT
MARKER SURG CKPNT HEX IMPCT 3.5MM STRL (MED SURG SUPPLIES) ×1 IMPLANT
MAT INSTR TRY 44X36IN WTPRF BACKSHEET TPNX BLU (MISCELLANEOUS PT CARE ITEMS) IMPLANT
MATTRESS TRANSF 78X34IN AIRPAL C1000LB LONG STD 2 BELT 2 HOSE ATTACHMENT PNT SNAP BUTTON LBL DISP (MED SURG SUPPLIES) ×1 IMPLANT
NAVIGATE VIZADISC HIP TRK KIT SYSTEM STRL LF  DISP (MED SURG SUPPLIES) ×1 IMPLANT
NEEDLE HYPO  18GA 1.5IN STD REG BVL LF (MED SURG SUPPLIES) ×1
PACK SURG ASCP STRL DISP ~~LOC~~ BPT MED CNTR LF (CUSTOM TRAYS & PACK) ×1
PIN FIX 170MM 4MM KNEE STRL (IMPLANTS TRAUMA) ×1 IMPLANT
SET INTPLS SUCT TUBE FAN SPRAY TIP HANDPC STRL LF  DISP (MED SURG SUPPLIES) ×1 IMPLANT
SHELL ACET 52MM HIP SLD BCK TRIT TRIDENT II E STRL (IMPLANTS HIP) ×1 IMPLANT
SOL IRRG 0.9% NACL 2000ML PRSV FR N-PYRG FLXB CONTAINR STRL LF (MEDICATIONS/SOLUTIONS) ×1 IMPLANT
SOL SURG PREP 26ML DRPRP 74% ISPRP 0.7% IOD POVACRYLEX SLF CNTN APPL SKIN STRL PREOP (MED SURG SUPPLIES) ×1 IMPLANT
SPONGE LAP 18X18IN PREWASH RIGID TRY STRL LF  WHT (MED SURG SUPPLIES) ×3 IMPLANT
SUTURE 1 CT STRATAFIX PDS + 18IN VIOL ABS KNOTLESS TISS CONTROL SMTR ABS (SUTURE/WOUND CLOSURE) ×1 IMPLANT
SUTURE 1 CT VICRYL 36IN VIOL BRD COAT ABS (SUTURE/WOUND CLOSURE) ×2 IMPLANT
SUTURE 2-0 GS-21 POLYSRB 30IN UNDYED BRD COAT ABS (SUTURE/WOUND CLOSURE) ×2 IMPLANT
SUTURE 3-0 PS2 MONOCRYL MTPS 27IN UNDYED MONOF ABS (SUTURE/WOUND CLOSURE) ×1 IMPLANT
SUTURE 4-0 FS1 PROLENE 18IN BLU MONOF NONAB (SUTURE/WOUND CLOSURE) ×2 IMPLANT
SUTURE 5 V-37 ETHIBOND 30IN GRN BRD 4 STRN NONAB (SUTURE/WOUND CLOSURE) ×1 IMPLANT
SYRINGE LL 10ML LF  STRL GRAD N-PYRG DEHP-FR PVC FREE MED DISP (MED SURG SUPPLIES) ×1 IMPLANT
SYS SKIN CLOSURE EXOFINFUSION PREMIERPRO 30CM ADH MESH MICROBIAL BRRER STER LF (SUTURE AIDS) ×1 IMPLANT
TIP PLS LAV INTPLS LONG FEM CNL COAX STRL DISP (MED SURG SUPPLIES) IMPLANT
TOWEL 24X16IN COTTON BLU DISP SURG STRL LF (DRAPE/PACKS/SHEETS/OR TOWEL) ×4 IMPLANT
TUBING SUCT CLR 6FT .25IN ARGYLE PVC NCDTV STR MALE FEMALE MLD CONN STRL LF (MED SURG SUPPLIES) IMPLANT
WOUND IRRG IRRISEPT DBRD CLNSG 0.05% CHG SYSTEM STRL LF (WOUND CARE SUPPLY) ×2 IMPLANT

## 2022-09-28 NOTE — Interval H&P Note (Signed)
H & P updated the day of the procedure.  1.  H&P completed within 30 days of surgical procedure and has been reviewed within 24 hours of admission but prior to surgery or a procedure requiring anesthesia services, the patient has been examined, and no change has occured in the patients condition since the H&P was completed.       Change in medications: No        No LMP recorded.      Comments:     2.  Patient continues to be appropriate candidate for planned surgical procedure. YES    Reuven Braver, DO

## 2022-09-28 NOTE — PT Evaluation (Signed)
[facility]  [address]  [place], [address]  [phone]  (Fax) [phone]  Rehabilitation Services  Physical Therapy Inpatient THA Initial Evaluation    Patient Name: Brandon Wells.  Date of Birth: May 08, 1951  Height: Height: 172.7 cm (5\' 8" )  Weight: Weight: 80.7 kg (178 lb)  Room/Bed: 382/A  Payor: AETNA-MEDI-ADV / Plan: AET[employer] MEDICARE ADVANTAGE PPO / Product Type: PPO /       PMH:  Past Medical History:   Diagnosis Date    Allergic rhinitis     Arthritis     Back problem     CPAP (continuous positive airway pressure) dependence     Esophageal reflux     Hypercholesterolemia     Hyperlipidemia     Hypertension     Myocardial infarction (CMS HCC)     Peripheral edema     Personal history of venous thrombosis and embolism     Renal calculi     Sleep apnea     Stented coronary artery     Wears glasses            Assessment:      (P) Pt is a 72 yo male 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 PT Assessment: Pt performed supine<>sit IND. STS with FWW and SBA, Amb x 160' with FWW and SBA. L LE strength of 3+ throughout. Pt required VC's for hip precautions.    Discharge Needs:    Equipment Recommendation: (P) front wheeled walker      The patient presents with mobility limitations due to impaired functional activity tolerance that significantly impair/prevent patient's ability to participate in mobility-related activities of daily living (MRADLs) including  ambulation and transfers in order to safely complete, toileting, bathing, food preparation, safely entering/exiting the home, in reasonable time. This functional mobility deficit can be sufficiently resolved with the use of a (P) front wheeled walker  in order to decrease the risk of falls, morbidity, and mortality in performance of these MRADLs.  Patient is able to safely use this assistive device.    Discharge Disposition: (P) home    JUSTIFICATION OF DISCHARGE RECOMMENDATION   Based on current diagnosis, functional performance  prior to admission, and current functional performance, this patient requires continued PT services in (P) home in order to achieve significant functional improvements in these deficit areas: (P) gait, locomotion, and balance.        Plan:   Current Intervention:    To provide physical therapy services (P)  (1-2 x day for at least 1 day weekly.)  for duration of (P) until discharge.    The risks/benefits of therapy have been discussed with the patient/caregiver and he/she is in agreement with the established plan of care.       Subjective & Objective     Past Medical History:   Diagnosis Date    Allergic rhinitis     Arthritis     Back problem     CPAP (continuous positive airway pressure) dependence     Esophageal reflux     Hypercholesterolemia     Hyperlipidemia     Hypertension     Myocardial infarction (CMS HCC)     Peripheral edema     Personal history of venous thrombosis and embolism     Renal calculi     Sleep apnea     Stented coronary artery     Wears glasses  Past Surgical History:   Procedure Laterality Date    ACHILLES TENDON REPAIR Left     HX CORONARY STENT PLACEMENT      HX HERNIA REPAIR Bilateral     inguinal                 09/28/22 1600   Rehab Session   Document Type evaluation   PT Visit Date 09/28/22   Patient Effort excellent   Symptoms Noted During/After Treatment none   General Information   Patient Profile Reviewed yes   Onset of Illness/Injury or Date of Surgery 09/28/22   Medical Lines PIV Line   Respiratory Status nasal cannula   Existing Precautions/Restrictions full code;posterior hip precautions   Weight-bearing Status   Extremity Weight-bearing Status left lower extremity   Left Lower Extremity weight-bearing as tolerated (WBAT)   Mutuality/Individual Preferences   Patient-Specific Goals (Include Timeframe) To return to home.   Plan of Care Reviewed With patient   Living Environment   Lives With spouse   Living Arrangements house   Home Assessment: Stairs in Home;No  Problems Identified   Home Accessibility bed not on first floor;stairs (1 railing present);stairs within home;tub/shower is not walk in   Transportation Available car;family or friend will provide   Stairs Within Home, Primary   Stair Railings, Within Home, Primary railing on right side (ascending)   Number of Stairs, Within Home, Primary other (see comments)  (13)   Functional Level Prior   Ambulation 0 - independent   Transferring 0 - independent   Toileting 0 - independent   Bathing 0 - independent   Dressing 0 - independent   Eating 0 - independent   Communication 0 - understands/communicates without difficulty   Swallowing 0-->swallows foods/liquids without difficulty   Self-Care   Equipment Currently Used at Home yes   [facility]   Equipment Brought to Hospital none  (Pt reports that he has a FWW at home to use during recuperation.)   Pre Treatment Status   Pre Treatment Patient Status Patient supine in bed;Call light within reach;Patient safety alarm activated;Nurse approved session;Venodynes in place and activated   Support Present Pre Treatment  Family present   Communication Pre Treatment  Nurse   Communication Pre Treatment Comment Cleared for PT.   Cognitive Assessment/Interventions   Behavior/Mood Observations behavior appropriate to situation, WNL/WFL   Orientation Status oriented x 4   Attention WNL/WFL   Follows Commands WNL   Vital Signs   Pre-Treatment Heart Rate (beats/min) 72   Post-treatment Heart Rate (beats/min) 85   Pre Treatment BP 128/72   Post Treatment BP 142/70   Pre SpO2 (%) 97   O2 Delivery Pre Treatment room air   Post SpO2 (%) 95   O2 Delivery Post Treatment room air   Pain Assessment   Pretreatment Pain Rating 4/10   Posttreatment Pain Rating 3/10   Pain Location - Side Left   Pain Location hip   RLE Assessment   RLE Assessment WFL- Within Functional Limits   LLE Assessment   LLE Strength 3+ throughout   Bed Mobility Assessment/Treatment   Supine-Sit  Independence independent   Sit to Supine, Independence independent   Transfer Assessment/Treatment   Sit-Stand Independence stand-by assistance   Stand-Sit Independence stand-by assistance   Sit-Stand-Sit, Assist Device walker, front wheeled   Gait Assessment/Treatment   Total Distance Ambulated 160   Independence  stand-by assistance   Assistive Device  walker, front wheeled  Distance in Feet 160   Post Treatment Status   Post Treatment Patient Status Patient supine in bed;Call light within reach;Patient safety alarm activated;Telephone within reach;Venodynes in place and activated   Support Present Post Treatment  Family present   Communication Post Treatement Nurse   Plan of Care Review   Plan Of Care Reviewed With patient;spouse   Physical Therapy Clinical Impression   Assessment Pt is a 72 yo male 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 PT Assessment: Pt performed supine<>sit IND. STS with FWW and SBA, Amb x 160' with FWW and SBA. L LE strength of 3+ throughout. Pt required VC's for hip precautions.   Patient/Family Goals Statement To return to home.   Criteria for Skilled Therapeutic yes;meets criteria;skilled treatment is necessary   Pathology/Pathophysiology Noted musculoskeletal   Impairments Found (describe specific impairments) gait, locomotion, and balance   Functional Limitations in Following  self-care;home management;community/leisure   Rehab Potential good   Therapy Frequency   (1-2 x day for at least 1 day weekly.)   Predicted Duration of Therapy Intervention (days/wks) until discharge   Anticipated Equipment Needs at Discharge (PT) front wheeled walker   Anticipated Discharge Disposition home   Evaluation Complexity Justification   Examination Components 4 or more Exam elements addressed;Strength;Bed mobility;Transfers;Ambulation   Presentation Stable: Uncomplicated, straight-forward, problem focused   Clinical Decision Making Low complexity   Evaluation Complexity Low complexity   (INSERT FLOWSHEET)    TOTAL HIP  ARTHROPLASTY (THA) GOALS    1) AMBULATE 300 FEET WITH WALKER AND Mod I.  2) TRANSFER BED TO/FROM CHAIR WITH FWW WHILE MAINTAINING THA PRECAUTIONS.Marland Kitchen   3) NEGOTIATES TRAINING STEPS USING HANDRAIL AND Mod I.       Physical Therapy Inpatient THA D/C Criteria        PATIENT/CAREGIVER EDUCATION  Educated on exercise program? COMMENT To be addressed tomorrow in Ortho Grp.   Can successfully demonstrate exercise form? COMMENT To be assessed during Ortho Grp tomorrow.  Can demonstrate safe/effective assistance with functional mobility? YES    IMPAIRMENT BASED CRITERIA  Can the patient verbalize THA precautions including no leg crossing, no bending > 90 degrees and no twisting? YES  Does the patient have adequate pain control of <4/10 for functional mobility at home? YES    FUNCTIONAL BASED CRITERIA  Does that patient demonstrate the ability to ambulate 80 feet with RW using CGA/SPV assistance? YES  Does the patient demonstrate the ability to perform bed mobility with no more than min assist? YES  Does the patient demonstrated the ability to sit to stand from the bed or chair with min assist? YES  Does the patient demonstrate the ability to walk up/down 3-5 stairs with min assist as required for home entry? COMMENT To be assessed tomorrow during Ortho Grp.              INTERVENTION MINUTES: EVALUATION 30 minutes and GAIT TRAINING 15 MINUTES    EVALUATION COMPLEXITY : CLINICAL DECISION MAKING OF LOW COMPLEXITY AS INDICATED BY PMH, PHYSICAL THERAPY ASSESSMENT OF MUSCULOSKELETAL AND NEUROLOGICAL SYSTEMS AND ACTIVITY LIMITATIONS. CLINICAL PRESENTATION IS STABLE AND UNCOMPLICATED    Therapist:     Kerry Fort, PT  09/28/2022, 17:28

## 2022-09-28 NOTE — H&P (Signed)
Paper H and P on chart, will be scanned to EMR.

## 2022-09-28 NOTE — OR Surgeon (Addendum)
[facility]   OPERATIVE REPORT  PATIENT NAME:  Brandon, Wells.  MRN:  [id number]  DOB:  03-Jul-1950      DATE OF PROCEDURE:  09/28/2022   PRE OP DIAGNOSIS: End Stage Arthritis   POST OP DIAGNOSIS: Same  PROCEDURE:  Left  Total hip arthroplasty, Mako Robotic assisted  COMPONENTS:   Implant Name Type Inv. Item Serial No. Manufacturer Lot No. LRB No. Used Action   SHELL ACET HIP SLD BCK TRIT TRIDENT II E STRL - [id number]  SHELL ACET HIP SLD BCK TRIT TRIDENT II E STRL  [employer] [id number] Left 1 Implanted   INSERT ACET TRIDENT 10D E X3 HIP - [id number]  INSERT ACET TRIDENT 10D E X3 HIP  [employer] [id number] Left 1 Implanted   HEAD V40 +0MM OFST TAPER HIP BLX D FEM STRL - [id number]  HEAD V40 +0MM OFST TAPER HIP BLX D FEM STRL  [employer] [id number] Left 1 Implanted   IMPLANT RECORD       1 Implanted       System:     Accolade    Citation    HFX    Secure fit       Femur:  Size ____           Neck angle:  127   132   Acetabulum: ____       Screws:_____   Head:  26 28   32  36      *single choice, default is 36   Head:  -5  -4 -2.5   0 +2.5  +4  +5  +7.5  +10       *single choice, default is 0  Head:  C tpr  V 40:   RULE:  If System is Accolade, Citation, default is V40, if System is HFX or Secure fit, Default is CTPR.  MANUFACTURER:  [employer]ker ( default)  Other:  ANESTHESIA : General   SURGEON: Surgeon(s):  Quenton Fetter, DO   ASSISTING SURGEON: Surgeons and Role:     Quenton Fetter, DO - Primary   ANTIBIOTIC: Kefzol  ANTICOAGULATION: other asa  FIRST ASSISTANT: PBfirstassist: Renee Shrewsbury  ESTIMATED BLOOD LOSS:105cc  SPECIMEN: * No specimens in log *   DESCRIPTION OF THE PROCEDURE:  Pre-op digital planning completed on the CT dataset.  Sizing and position of implants, leg length, and offset determined and reviewed.  Side and site were identified with consent and marking on patient.  Antibiotics and TXA were administered prior to incision.  Prep  and draping were performed in the lateral decubitus position.  An EKG marker was secured to the patella.  Pelvic array positioning: After sterile preparation and draping, 1 pin was placed in the iliac crest 3 fingerbreadths posterior to the anterior superior iliac spine.  The pin guide was then applied and the second pin placed in appropriate orientation.  The anchor for the pelvic array was then assembled and the final third pin inserted.  The pelvic array was aligned and tightened.  Posterolateral approach to hip was completed.  Short external rotators were divided and retracted to protect the sciatic nerve.  Prior to dislocation of the hip, calibration button was placed in the greater trochanter and calibration for leg length performed with leg in a known position, checking both the knee and the trochanteric marker.  The hip was dislocated, and the femoral neck was marked using the probe at the osteotomy site  determined in the robotic plan.  Femur osteotomized at appropriate length and orientation.  The acetabulum was exposed, and calibration performed.  Once satisfactory calibration was verified, the robotic arm was used to perform one-step reaming to appropriate depth.  Acetabular position and reaming were verified using the freehand probe.  The acetabulum was impacted in orientation designated on the preop CT plan.  Depth of seating was verified with the robot and was verified through the central hole.  The hole was capped with the screw in plug.  The polyethylene liner was seated.  Orientation and fixation of the shell and poly was verified.  The femoral side was exposed.  The trochanter was opened with trochanteric gouge.  The femur was reamed and broached to the above noted size.  Trial reduction was performed and found to be stable through fully obtainable range.  Trial reduction was performed, and leg lengths evaluated and found to match preoperative plan.  Hip was put through full range of motion with  excellent stability.  The hip was dislocated and provisionals were removed.  The final components were seated.  The femoral head was reduced on to a dry trunnion, and the hip was reduced.  Final testing revealed excellent stability and appropriate soft tissue tension.  Leg length was rechecked with the robot and found to correspond with the preop CT plan.  Measurement buttons were removed.  The short external rotators were reattached to their trochanteric insertions using #5 ethibond suture.  The wound was closed in layers finishing with subcuticular stitch.  Patient was taken to recovery under the supervision of anesthesia.  Quenton Fetter, DO  09/28/2022, 12:04   This note was partially generated using MModal Fluency Direct system, and there may be some incorrect words, spellings, and punctuation that were not noted in checking the note before saving.

## 2022-09-28 NOTE — Anesthesia Transfer of Care (Signed)
ANESTHESIA TRANSFER OF CARE   Brandon Wells. is a 72 y.o. ,male, Weight: 80.7 kg (178 lb)   had Procedure(s):  MAKO ROBOTIC ASSISTED LEFT TOTAL HIP ARTHROPLASTY USING NONCEMENTED INSIGNIA STEM AND TRIDENT II IMPLANTS  performed  09/28/22   Primary Service: Quenton Fetter, DO    Past Medical History:   Diagnosis Date    Allergic rhinitis     Arthritis     Back problem     CPAP (continuous positive airway pressure) dependence     Esophageal reflux     Hypercholesterolemia     Hyperlipidemia     Hypertension     Myocardial infarction (CMS [facility])     Peripheral edema     Personal history of venous thrombosis and embolism     Renal calculi     Sleep apnea     Stented coronary artery     Wears glasses       Allergy History as of 09/28/22       PREDNISONE         Noted Status Severity Type Reaction    09/09/21 0619 Duffy, Misty Stanley, CPhT 07/29/21 Active    Other Adverse Reaction (Add comment)    Comments: Insomnia                   I completed my transfer of care / handoff to the receiving personnel during which we discussed:  Access, Airway, All key/critical aspects of case discussed, Analgesia, Antibiotics, Expectation of post procedure, Fluids/Product, Gave opportunity for questions and acknowledgement of understanding, Labs and PMHx      Post Location: PACU                                                             Last OR Temp: Temperature: 36.4 C (97.5 F)  ABG:  POTASSIUM   Date Value Ref Range Status   08/31/2022 3.8 3.5 - 5.1 mmol/L Final     KETONES   Date Value Ref Range Status   08/31/2022 Negative Negative, Trace mg/dL Final     CALCIUM   Date Value Ref Range Status   08/31/2022 9.3 8.6 - 10.3 mg/dL Final     Calculated P Axis   Date Value Ref Range Status   08/31/2022 29 degrees Final     Calculated R Axis   Date Value Ref Range Status   08/31/2022 46 degrees Final     Calculated T Axis   Date Value Ref Range Status   08/31/2022 244 degrees Final     Airway:* No LDAs found *  Blood pressure (!) 143/80,  pulse 58, temperature 36.4 C (97.5 F), resp. rate (!) 11, height 1.727 m (5\' 8" ), weight 80.7 kg (178 lb), SpO2 100%.

## 2022-09-28 NOTE — Anesthesia Postprocedure Evaluation (Signed)
Anesthesia Post Op Evaluation    Patient: Brandon Wells.  Procedure(s):  MAKO ROBOTIC ASSISTED LEFT TOTAL HIP ARTHROPLASTY USING NONCEMENTED INSIGNIA STEM AND TRIDENT II IMPLANTS    Last Vitals:Temperature: 36.3 C (97.4 F) (09/28/22 1320)  Heart Rate: 76 (09/28/22 1320)  BP (Non-Invasive): 135/78 (09/28/22 1320)  Respiratory Rate: 20 (09/28/22 1320)  SpO2: 98 % (09/28/22 1320)    No notable events documented.    Patient is sufficiently recovered from the effects of anesthesia to participate in the evaluation and has returned to their pre-procedure level.  Patient location during evaluation: PACU       Patient participation: complete - patient participated  Level of consciousness: awake and alert and responsive to verbal stimuli    Pain score: 4  Pain management: adequate  Airway patency: patent    Anesthetic complications: no  Cardiovascular status: acceptable  Respiratory status: acceptable  Hydration status: acceptable  Patient post-procedure temperature: Pt Normothermic   PONV Status: Absent

## 2022-09-28 NOTE — Anesthesia Preprocedure Evaluation (Signed)
ANESTHESIA PRE-OP EVALUATION  Planned Procedure: MAKO ROBOTIC ASSISTED LEFT TOTAL HIP ARTHROPLASTY USING INSIGNIA IMPLANTS (Left: Hip)  Review of Systems     anesthesia history negative     patient summary reviewed  nursing notes reviewed        Pulmonary   sleep apnea and CPAP,   Cardiovascular    Hypertension, well controlled, past MI (2018), CAD, CHF, ECG reviewed, LVEF 40-45%, cardiac stents (1 stent 2018), atrial fibrillation (during MI in 2018), ACE / ARB inhibitor use and hyperlipidemia ,No peripheral edema,  Exercise Tolerance: > or = 4 METS   ,beta blocker therapy  ,taken in last 24 hours     GI/Hepatic/Renal    GERD, well controlled and kidney stones        Endo/Other    osteoarthritis and drug induced coagulopathy (Plavix last dose 09/22/22),      Neuro/Psych/MS    back abnormality     Cancer    negative hematology/oncology ROS,                     Physical Assessment      Airway       Mallampati: III    TM distance: <3 FB    Neck ROM: full  Mouth Opening: good.  Facial hair  No Beard        Dental           (+) caps             Pulmonary    Breath sounds clear to auscultation  (-) no rhonchi, no decreased breath sounds, no wheezes, no rales and no stridor     Cardiovascular    Rhythm: regular  Rate: Normal  (-) no friction rub, carotid bruit is not present, no peripheral edema and no murmur     Other findings  Bridge upper right central and lateral incisors            Plan  ASA 3     Planned anesthesia type: general     general anesthesia with endotracheal tube intubation      PONV Plan:  I plan to administer pharmcologic prophalaxis antiemetics  POV PLAN:   plan for postoperative opioid use    SLEEP APNEA  Patient is at risk of obstructive sleep apnea and Education provided regarding risk of obstructive sleep apnea        Intravenous induction     Anesthesia issues/risks discussed are: Dental Injuries, Stroke, Nerve Injuries, Intraoperative Awareness/ Recall, Eye /Visual Loss, Aspiration, PONV, Blood  Loss, Cardiac Events/MI and Sore Throat.  Anesthetic plan and risks discussed with patient  signed consent obtained      Use of blood products discussed with patient who consented to blood products.      Patient's NPO status is appropriate for Anesthesia.           Plan discussed with CRNA.    (Use glidescope)

## 2022-09-29 ENCOUNTER — Telehealth (INDEPENDENT_AMBULATORY_CARE_PROVIDER_SITE_OTHER): Payer: Self-pay | Admitting: Internal Medicine

## 2022-09-29 DIAGNOSIS — Z96642 Presence of left artificial hip joint: Secondary | ICD-10-CM

## 2022-09-29 LAB — CBC WITH DIFF
BASOPHIL #: 0 10*3/uL (ref 0.00–0.10)
BASOPHIL %: 0 % (ref 0–1)
EOSINOPHIL #: 0 10*3/uL (ref 0.00–0.50)
EOSINOPHIL %: 0 % — ABNORMAL LOW
HCT: 37.5 % (ref 36.7–47.1)
HGB: 13 g/dL (ref 12.5–16.3)
LYMPHOCYTE #: 0.6 10*3/uL — ABNORMAL LOW (ref 1.00–3.00)
LYMPHOCYTE %: 5 % — ABNORMAL LOW (ref 16–44)
MCH: 32.1 pg (ref 23.8–33.4)
MCHC: 34.6 g/dL (ref 32.5–36.3)
MCV: 92.8 fL (ref 73.0–96.2)
MONOCYTE #: 0.4 10*3/uL (ref 0.30–1.00)
MONOCYTE %: 3 % — ABNORMAL LOW (ref 5–13)
MPV: 7.8 fL (ref 7.4–11.4)
NEUTROPHIL #: 10 10*3/uL — ABNORMAL HIGH (ref 1.85–7.80)
NEUTROPHIL %: 91 % — ABNORMAL HIGH (ref 43–77)
PLATELET COMMENT: NORMAL
PLATELETS: 129 10*3/uL — ABNORMAL LOW (ref 140–440)
RBC COMMENT: NORMAL
RBC: 4.04 10*6/uL — ABNORMAL LOW (ref 4.06–5.63)
RDW: 13.5 % (ref 12.1–16.2)
WBC: 11 10*3/uL — ABNORMAL HIGH (ref 3.6–10.2)

## 2022-09-29 LAB — BASIC METABOLIC PANEL
ANION GAP: 6 mmol/L (ref 4–13)
BUN/CREA RATIO: 18 (ref 6–22)
BUN: 21 mg/dL (ref 7–25)
CALCIUM: 8.6 mg/dL (ref 8.6–10.3)
CHLORIDE: 105 mmol/L (ref 98–107)
CO2 TOTAL: 26 mmol/L (ref 21–31)
CREATININE: 1.16 mg/dL (ref 0.60–1.30)
ESTIMATED GFR: 68 mL/min/{1.73_m2} (ref >59)
GLUCOSE: 200 mg/dL — ABNORMAL HIGH (ref 74–109)
OSMOLALITY, CALCULATED: 283 mOsm/kg (ref 270–290)
POTASSIUM: 4.7 mmol/L (ref 3.5–5.1)
SODIUM: 137 mmol/L (ref 136–145)

## 2022-09-29 MED ORDER — HYDROCODONE 7.5 MG-ACETAMINOPHEN 325 MG TABLET
1.0000 | ORAL_TABLET | Freq: Four times a day (QID) | ORAL | 0 refills | Status: DC | PRN
Start: 2022-09-29 — End: 2022-10-25

## 2022-09-29 MED ORDER — HYDROCODONE 7.5 MG-ACETAMINOPHEN 325 MG TABLET
1.0000 | ORAL_TABLET | Freq: Four times a day (QID) | ORAL | 0 refills | Status: DC | PRN
Start: 2022-09-29 — End: 2022-09-29

## 2022-09-29 MED ORDER — ASPIRIN 81 MG TABLET,DELAYED RELEASE
81.0000 mg | DELAYED_RELEASE_TABLET | Freq: Two times a day (BID) | ORAL | Status: DC
Start: 2022-09-29 — End: 2023-01-19

## 2022-09-29 NOTE — Telephone Encounter (Signed)
PT GETTING DISCHARGED ON 09-29-22 FROM [facility] COMING 10-07-22 RE

## 2022-09-29 NOTE — Discharge Summary (Signed)
[facility]  DISCHARGE SUMMARY    PATIENT NAME:  Brandon Wells, Brandon Wells.  MRN:  [id number]  DOB:  10/23/1950    ENCOUNTER DATE:  09/28/2022  INPATIENT ADMISSION DATE:   DISCHARGE DATE:  09/29/2022    ATTENDING PHYSICIAN: Quenton Fetter, DO  SERVICE: [facility]  PRIMARY CARE PHYSICIAN: Lucia Gaskins, DO       No lay caregiver identified.    PRIMARY DISCHARGE DIAGNOSIS: Status post total hip replacement, right  Active Hospital Problems    Diagnosis Date Noted    Principal Problem: Status post total hip replacement, right [Z96.641] 09/28/2022      Resolved Hospital Problems   No resolved problems to display.     Active Non-Hospital Problems    Diagnosis Date Noted    Knee pain, right anterior 08/09/2022    Breast pain, left 08/09/2022    PAF (paroxysmal atrial fibrillation) (CMS HCC) 01/18/2022    Volume overload 01/14/2022    OSA on CPAP 01/14/2022    Hypoxia 01/14/2022    History of placement of stent in LAD coronary artery 01/14/2022    High cholesterol 01/14/2022    Essential hypertension 01/14/2022    Elevated troponin 01/14/2022    History of atrial fibrillation 10/19/2016    Coronary artery disease involving native coronary artery of native heart without angina pectoris 09/07/2016    Systolic and diastolic CHF, 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        Details   aspirin 81 mg Tablet, Delayed Release (E.C.)  Commonly known as: ECOTRIN   81 mg, Oral, DAILY  Refills: 0     atorvastatin 80 mg Tablet  Commonly known as: LIPITOR   80 mg, Oral, NIGHTLY  Refills: 0     cholecalciferol (vitamin D3) 25 mcg (1,000 unit) Tablet   2,000 Units, Oral, DAILY  Refills: 0     clopidogreL 75 mg Tablet  Commonly known as: PLAVIX   75 mg, Oral, DAILY  Refills: 0     cyanocobalamin 1,000 mcg Tablet  Commonly known as: VITAMIN B  12   1,000 mcg, Oral, DAILY  Refills: 0     furosemide 20 mg Tablet  Commonly known as: LASIX   20 mg, Oral, DAILY  Refills: 0     lisinopriL 5 mg Tablet  Commonly known as: PRINIVIL   5 mg, Oral, DAILY  Refills: 0     metoprolol succinate 25 mg Tablet Sustained Release 24 hr  Commonly known as: TOPROL-XL   25 mg, Oral, DAILY  Refills: 0     mometasone 50 mcg/actuation Spray, Non-Aerosol  Commonly known as: NASONEX   2 Sprays, Nasal, DAILY PRN  Qty: 3 Each  Refills: 2     pantoprazole 40 mg Tablet, Delayed Release (E.C.)  Commonly known as: PROTONIX   40 mg, Oral, DAILY  Refills: 0     Tylenol Arthritis Pain 650 mg Tablet Sustained Release  Generic drug: acetaminophen   650 mg, Oral, EVERY 8 HOURS PRN  Refills: 0            Discharge med list refreshed?  YES     Allergies   Allergen Reactions    Prednisone  Other Adverse Reaction (Add comment)  Insomnia     HOSPITAL PROCEDURE(S):   No orders of the defined types were placed in this encounter.    Surgical/Procedural Cases on this Admission       Case IDs Date Procedure Surgeon Location Status    [id number] 09/28/22 MAKO ROBOTIC ASSISTED LEFT TOTAL HIP ARTHROPLASTY USING NONCEMENTED INSIGNIA STEM AND TRIDENT II IMPLANTS Quenton Fetter, DO [facility]          REASON FOR HOSPITALIZATION AND HOSPITAL COURSE   BRIEF HPI:  This is a 72 y.o., male admitted for elective THR   BRIEF HOSPITAL NARRATIVE:     Uncomplicated post operative course.  Met criteria for DC POD1.  ASA for DVT prophy  TRANSITION/POST DISCHARGE CARE/PENDING TESTS/REFERRALS: OPPT    CONDITION ON DISCHARGE:  A. Ambulation: Ambulation with assistive device  B. Self-care Ability: With partial assistance  C. Cognitive Status Alert and Oriented x 3  D. Code status at discharge:       LINES/DRAINS/WOUNDS AT DISCHARGE:   Patient Lines/Drains/Airways Status       Active Line / Dialysis Catheter / Dialysis Graft / Drain / Airway / Wound       Name Placement date Placement time Site Days    Peripheral IV  Left Cephalic  (lateral side of arm) 09/28/22  0755  -- 1    Wound  Incision Hip 09/28/22  --  -- 1                    DISCHARGE DISPOSITION:  Home discharge and Outpatient PT   DISCHARGE INSTRUCTIONS:  Post-Discharge Follow Up Appointments       Follow up with Lucia Gaskins, DO in 1 week(s)    Phone: [phone]    Where: [facility]      Thursday Oct 13, 2022    Follow up with Quenton Fetter, DO    Phone: [phone]    Where: [facility]      Tuesday Oct 25, 2022    Return Patient Visit with Lucia Gaskins, DO at  9:00 AM      Internal Medicine, [facility]  [employer] [facility], [place]  [address]  [place] [address]  [phone]             PT EVALUATE AND TREAT- OUTPATIENT     Need to be addressed: EVALUATE AND TREAT    Reason: post op      DME - WALKER Front Wheeled    Please note - If patient is 300 lbs or greater please order bariatric or heavy duty items.     Patient has mobility limits that significantly impairs ability to participate in one or more mobility related ADL's (MRADL's): Yes    Moblity Limitations: Pt at heightened risk of injury r/t attempts to fulfill MRADL's & can safely use walker which resolves issue    Walker Type: Front Wheeled    Freedom of Choice: I have informed patient of their freedom of choice with respect to DME providers      DME - BEDSIDE COMMODE    A standard commode is covered when the patient is physically incapable of utilizing regular toilet facilities.  An extra wide/heavy duty commode chair is covered for a patient who weighs 300 pounds or more.     I certify that a bedside commode is necessary due to Patient is confined to a single room    Bedside Commode Type Standard Bedside Commode  Freedom of Choice: I have informed patient of their freedom of choice with respect to DME providers           Alexia Freestone, MD    Copies sent to Care Team         Relationship Specialty Notifications Start End     Lucia Gaskins, DO PCP - General INTERNAL MEDICINE Admissions 08/27/21     Phone: [phone] Fax: [phone]         [address] [place] [address]            Referring providers can utilize [email or web address] to access their referred [facility] patient's information.

## 2022-09-29 NOTE — PT Treatment (Signed)
[facility] Medicine [facility]  [address]  [place], [address]  [phone]  (Fax) [phone]  Rehabilitation Department  Physical Therapy Daily Inpatient THA Note    Date: 09/29/2022  Patient's Name: Brandon Wells.  Date of Birth: 22-Feb-1951  Height: Height: 172.7 cm (5\' 8" )  Weight: Weight: 80.7 kg (178 lb)      Plan: Will continue under current POC.         Subjective/Objective/Assessment:  Flowsheet    09/29/22 0850   Rehab Session   Document Type therapy progress note (daily note)   PT Visit Date 09/29/22   General Information   Patient Profile Reviewed yes   Existing Precautions/Restrictions posterior hip precautions   Pre Treatment Status   Communication Pre Treatment Comment cleared for PT   Therapeutic Exercise   Comment patient particpated with group therapy, did well with all exs, reviewed hip precautions, car transferes, general safety for home, all questions answered   Post Treatment Status   Post Treatment Patient Status Patient sitting in bedside chair or w/c   Support Present Post Treatment  None   Patient Effort excellent   Post-Treatment Pain   Posttreatment Pain Rating 2/10   Cognitive Assessment/Intervention   Behavior/Mood Observations behavior appropriate to situation, WNL/WFL   Physical Therapy Time and Intention   Total PT Minutes: 30     Patient did well with ex program for home, all questions answered, reviewed hip precautions, car transfers          Physical Therapy Inpatient THA D/C Criteria        PATIENT/CAREGIVER EDUCATION  Educated on exercise program? YES  Can successfully demonstrate exercise form? YES  Can demonstrate safe/effective assistance with functional mobility? YES    IMPAIRMENT BASED CRITERIA  Can the patient verbalize THA precautions including no leg crossing, no bending > 90 degrees and no twisting? YES  Does the patient have adequate pain control of <4/10 for functional mobility at home? YES    FUNCTIONAL BASED CRITERIA  Does that  patient demonstrate the ability to ambulate 80 feet with RW using CGA/SPV assistance? YES  Does the patient demonstrate the ability to perform bed mobility with no more than min assist? YES  Does the patient demonstrated the ability to sit to stand from the bed or chair with min assist? YES  Does the patient demonstrate the ability to walk up/down 3-5 stairs with min assist as required for home entry? YES        THERAPIST  Lane Hacker, PTA  09/29/2022, 11:50         Intervention minutes: GROUP THERAPEUTIC EXERCISE 30 MINUTES    THERAPIST  Lane Hacker, PTA  09/29/2022, 11:50

## 2022-09-29 NOTE — Nurses Notes (Signed)
AVS teaching done with Brandon Wells and wife who is present at bedside. Teaching done on signs and symptoms of infection/ DVT'S, incision care, 1 paper prescriptions given, hip precaution reviewed, pt aware to get aspirin from any pharmacy and how to take, pt aware of follow up appointments. Brandon Wells verbalized understanding of all education and pt aware of reasons to contact MD or 911. Wheelchair to lobby.

## 2022-09-29 NOTE — Nurses Notes (Signed)
All questions answered, wife took notes on her phone while education was done.

## 2022-09-29 NOTE — PT Treatment (Signed)
[facility] Medicine [facility]  [address]  [place], [address]  [phone]  (Fax) [phone]  Rehabilitation Department  Physical Therapy Daily Inpatient Note    Date: 09/29/2022  Patient's Name: Brandon Wells.  Date of Birth: 1950-09-29  Height: Height: 172.7 cm (5\' 8" )  Weight: Weight: 80.7 kg (178 lb)      Plan: Will continue under current POC.         Subjective/Objective/Assessment:  Flowsheet    09/29/22 0830   Rehab Session   Document Type therapy progress note (daily note)   PT Visit Date 09/29/22   General Information   Patient Profile Reviewed yes   Existing Precautions/Restrictions full code;posterior hip precautions   Pre Treatment Status   Pre Treatment Patient Status Nurse approved session;Patient sitting in bedside chair or w/c   Pre- Treatment Vital Signs   Vitals Comment disconnected from monitoring   Pre-Treatment Pain   Pretreatment Pain Rating 2/10   Transfer Assessment/Treatment   Sit-Stand Independence stand-by assistance   Stand-Sit Independence stand-by assistance   Sit-Stand-Sit, Assist Device walker, front wheeled   Toilet Transfer Independence stand-by assistance   Toilet Transfer Assist Device walker, front wheeled   Gait Assessment/Treatment   Total Distance Ambulated 340   Independence  stand-by assistance   Assistive Device  walker, front wheeled   Gait Speed normal   Maintain Weight Bearing Status able to maintain   Comment 1 cue given to not twist when turning out of bathroom   Stairs Assessment/Treatment   Number of Stairs 4   Handrail Location both sides   Independence Level stand-by assistance   Technique Used step to step (ascending);step to step (descending)   Maintain Weight Bearing Status able to maintain weight bearing status   Impairments pain   Balance Skill Training   Sit-to-Stand Balance good balance   Standing Balance: Static good balance   Standing Balance: Dynamic good balance   Post Treatment Status   Post Treatment Patient Status  Patient sitting in bedside chair or w/c;Call light within reach   Patient Effort excellent   Post-Treatment Pain   Posttreatment Pain Rating 5/10   Cognitive Assessment/Intervention   Behavior/Mood Observations behavior appropriate to situation, WNL/WFL   Physical Therapy Time and Intention   Total PT Minutes: 13     Pt seated in vascular chair. Reviewed hip precautions, pt recalled 3/3. He stands with SBA and gaits to bathroom in his room. One cue given to not twist when turning out of bathroom. Pt then gaits x 340' using RW and SBA, good reciprocal gt pattern. Asc/descended 4 stairs with SBA then returned to chair in group therapy room. Call light in reach.             Intervention minutes: GAIT TRAINING [address] MINUTES    THERAPIST  Raynald Kemp, PTA  09/29/2022, 09:02

## 2022-09-29 NOTE — OT Evaluation (Signed)
[facility]  [address]  [place], [address]  [phone]  (Fax) [phone]  Rehabilitation Services  Occupational Therapy Inpatient Initial Evaluation      Patient Name: Brandon Wells.  Date of Birth: 01-25-1951  Height: Height: 172.7 cm ( )  Weight: Weight: 80.7 kg (178 lb)  Room/Bed: 382/A  Payor: AETNA-MEDI-ADV / Plan: [employer]ET[employer] MEDICARE ADVANTAGE PPO / Product Type: PPO /         PMH:   Past Medical History:   Diagnosis Date    Allergic rhinitis     Arthritis     Back problem     CPAP (continuous positive airway pressure) dependence     Esophageal reflux     Hypercholesterolemia     Hyperlipidemia     Hypertension     Myocardial infarction (CMS HCC)     Peripheral edema     Personal history of venous thrombosis and embolism     Renal calculi     Sleep apnea     Stented coronary artery     Wears glasses            Assessment:    Patient is a 72 year old male admitted for elective L THA.  OT orders evaluation with follow-up ADL training to include issuing the following:  Reacher, sock aid, a shoe horn, bath sponge was completed with patient able to return demo correct use of ADL equipment and adherence of hip precautions at modified independent level.  Spouse present and supportive throughout assessment and ADL training.  Patient is scheduled for discharge home this date with family assist       Discharge Needs:   Equipment Recommendation:  NONE    The patient presents with mobility limitations due to impaired range of motion that significantly impair/prevent patient's ability to participate in mobility-related activities of daily living (MRADLs) including  ambulation and transfers in order to safely complete, bathing, laundering/household tasks. This functional mobility deficit can be sufficiently resolved with the use of a Anticipated Equipment Needs at Discharge: (P) none anticipated  in order to decrease the risk of falls, morbidity, and mortality in  performance of these MRADLs.  Patient is able to safely use this assistive device.    Discharge Disposition:  HOME WITH FAMILY ASSIST    JUSTIFICATION OF DISCHARGE RECOMMENDATION   Based on current diagnosis, functional performance prior to admission, and current functional performance, this patient requires continued OT services in Anticipated Discharge Disposition: (P) home with assist  in order to achieve significant functional improvements.    Plan:   Current Intervention:  Predicted Duration of Therapy: (P) evaluation only    To provide Occupational therapy services  Therapy Frequency: (P) Evaluation Only for duration of Predicted Duration of Therapy: (P) evaluation only  .       The risks/benefits of therapy have been discussed with the patient/caregiver and he/she is in agreement with the established plan of care.       Subjective & Objective     MEDICAL HISTORY:   Past Medical History:   Diagnosis Date    Allergic rhinitis     Arthritis     Back problem     CPAP (continuous positive airway pressure) dependence     Esophageal reflux     Hypercholesterolemia     Hyperlipidemia     Hypertension     Myocardial infarction (CMS HCC)     Peripheral edema     Personal history  of venous thrombosis and embolism     Renal calculi     Sleep apnea     Stented coronary artery     Wears glasses          SURGICAL HISTORY:   Past Surgical History:   Procedure Laterality Date    ACHILLES TENDON REPAIR Left     HX CORONARY STENT PLACEMENT      HX HERNIA REPAIR Bilateral     inguinal       ipp    INSERT FLOW SHEET     09/29/22 0955   Rehab Session   Document Type evaluation   OT Visit Date 09/29/22   Total OT Minutes: 40   Patient Effort excellent   General Information   Patient Profile Reviewed yes   Onset of Illness/Injury or Date of Surgery 09/28/22   Medical Lines PIV Line   Respiratory Status room air   Existing Precautions/Restrictions fall precautions;full code;posterior hip precautions   Pre Treatment Status   Pre  Treatment Patient Status Patient sitting in bedside chair or w/c;Call light within reach;Telephone within reach;Patient safety alarm activated;Nurse approved session   Support Present Pre Treatment  Family present  (Spouse, Brandon Wells)   Web designer Pre Treatment Comment Cleared for OT   Mutuality/Individual Preferences   Anxieties, Fears or Concerns None stated by patient   Plan of Care Reviewed With patient;spouse   Living Environment   Living Environment Comment Patient reports that he lives in [place], [place] with his spouse in a 3 level home.  Home has 1 step with 1 rail for entrance/exit followed by additional 13 steps where bedroom/bathroom are located and 1 rail for accessibility.  Patient has tub/shower combo, tub bench, BSC, handheld shower sprayer, FWW.  Patient reports that he still drives and is retired from [employer].   Functional Level Prior   Ambulation 0 - independent   Transferring 0 - independent   Toileting 0 - independent   Bathing 0 - independent   Dressing 0 - independent   Eating 0 - independent   Communication 0 - understands/communicates without difficulty   Swallowing 0-->swallows foods/liquids without difficulty   Self-Care   Dominant Hand right   Usual Activity Tolerance excellent   Current Activity Tolerance excellent   Important Activities family;exercise;hobbies;social;spiritual   Regular Exercise yes   Vital Signs   Vitals Comment Patient currently disconnected from all monitoring systems   Pain Assessment   Pretreatment Pain Rating 2/10   Posttreatment Pain Rating 2/10   Pain Location - Side Left   Pain Location hip   Coping/Psychosocial   Observed Emotional State calm;cooperative;happy;pleasant   Verbalized Emotional State acceptance   Family/Support System   Family/Support Persons spouse   Involvement in Care at bedside;attentive to patient;interacting with patient;participating in care;supportive of patient   Cognitive Assessment/Interventions    Behavior/Mood Observations behavior appropriate to situation, WNL/WFL   Orientation Status oriented x 4   Attention WNL/WFL   Follows Commands WNL   Vision Assessment/Interventions   Visual Impairment/Limitations WFL;WFL with corrective lenses   RUE Assessment   RUE Assessment WFL- Within Functional Limits   LUE Assessment   LUE Assessment WFL- Within Functional Limits   Trunk Assessment   Trunk Assessment WFL-Within Functional Limits   Musculoskeletal   LLE Weight-Bearing Status weight-bearing as tolerated   Grip Strength   Grip Left (5/5) normal, left   Right Grip (5/5) normal, right   Transfer Assessment/Treatment  Sit-Stand Independence modified independence   Stand-Sit Independence modified independence   Sit-Stand-Sit, Assist Device walker, front wheeled   ADL Assessment/Intervention   ADL Comments Patient independent with upper body ADLs OT issued and educated patient on use following: Reacher, sock aid a shoe horn, bath sponge.  Patient return demo ability complete lower body ADLs undressing, dressing, sit-to-stand and stand to sit transfers using adaptive equipment and FWW at modified independent level.  Scheduled for discharge home today with family assist   Post Treatment Status   Post Treatment Patient Status Patient sitting in bedside chair or w/c;Call light within reach;Telephone within reach   Support Present Post Treatment  Family present   Publishing copy   Communication Post Treatment Comment Pleated to include ADL training with adaptive equipment.  Patient ready for discharge home today with family assist   Planned Therapy Interventions, OT Eval   Planned Therapy Interventions ADL retraining;transfer training   Clinical Impression   Criteria for Skilled Therapeutic Interventions Met (OT) yes;meets criteria;skilled treatment is necessary   Rehab Potential good   Therapy Frequency Evaluation Only   Predicted Duration of Therapy evaluation only   Anticipated Equipment  Needs at Discharge none anticipated   Anticipated Discharge Disposition home with assist   Evaluation Complexity Justification   Occupational Profile Review Brief history   Performance Deficits 1-3 deficits   Clinical Decision Making Low analytic complexity   Evaluation Complexity Low       TREATMENT PLAN: ADL/IADL TRAINING, EDUCATION, and DME/AE TRAINING  EVALUATION COMPLEXITY: CLINICAL DECISION MAKING OF LOW COMPLEXITY AS INDICATED BY PMH, OCCUPATIONAL THERAPY ASSESSMENT OF MUSCULOSKELETAL AND NEUROLOGICAL SYSTEMS AND ACTIVITY LIMITATIONS. CLINICAL PRESENTATION IS STABLE AND UNCOMPLICATED.      EVALUATION 15 minutes and ADL/IADL TRAINING 25 minutes    Therapist:      Hillery Jacks, OT,09/29/2022 13:27

## 2022-09-30 NOTE — Care Management Notes (Signed)
[facility]  Care Management Initial Evaluation    Patient Name: Brandon Wells.  Date of Birth: 1950-12-16  Sex: male  Date/Time of Admission: 09/28/2022  7:13 AM  Room/Bed: 382/A  Payor: AETNA-MEDI-ADV / Plan: AETNA MEDICARE ADVANTAGE PPO / Product Type: PPO /   Primary Care Providers:  Brandon Matte, DO (General)    Pharmacy Info:   Preferred Pharmacy       [phone] [place], [place] - [address] AT [facility]    [address] [place] [address]    Phone: [phone] Fax: [phone]    Hours: Not open 24 hours    [facility] - [place], [place] - [address]    [address] [place] [address]    Phone: [phone] Fax: [phone]    Hours: Not open 24 hours          Emergency Contact Info:   Extended Emergency Contact Information  Primary Emergency Contact: Brandon Wells  Mobile Phone: [phone]  Relation: Wife  Preferred language: English  Interpreter needed? No    History:   Brandon Wells. is a 72 y.o., male, admitted 09/28/22.    Height/Weight: 172.7 cm (5\' 8" ) / 80.7 kg (178 lb)     LOS: 0 days   Admitting Diagnosis: Status post total hip replacement, right [Z66.063]    Assessment:      09/29/22 1405   Assessment Details   Assessment Type Admission   Date of Care Management Update 09/29/22   Readmission   Is this a readmission? No   Insurance Information/Type   Insurance type Medicare   Employment/Financial   Patient has Prescription Coverage?  Yes        Name of Insurance Coverage for Medications Aetna Medicare   Financial Concerns none   Living Environment   Select an age group to open "lives with" row.  Adult   Lives With spouse   Living Arrangements house   Able to Return to Prior Arrangements yes   IEP and/or 504 Plan? No   Home Safety   Home Assessment: Stairs in Home   Home Accessibility stairs within home;stairs to enter home   Custody and Legal Status   Do you have a court appointed guardian/conservator? No   Are  you an emancipated minor? No   Custody Issues? No   Paternity Affidavit Requested? No   Care Management Plan   Discharge Planning Status initial meeting   Projected Discharge Date 09/29/22   CM will evaluate for rehabilitation potential yes   Patient choice offered to patient/family yes   Facility or Agency Preferences [facility]   Patient aware of possible cost for ambulance transport?  No   Discharge Needs Assessment   Outpatient/Agency/Support Group Needs other (see comments)  (Outpatient Physical Therapy)   Equipment Currently Used at Home cane, straight;walker, front wheeled;commode   Equipment Needed After Discharge none   Discharge Facility/Level of Care Needs Home (Patient/Family Member/other)(code 1)   Transportation Available car;family or friend will provide   Referral Information   Admission Type observation   Arrived From home or self-care   Stairs Within Home, Primary   Number of Stairs, Within Home, Primary other (see comments)  (13 steps)   Home Main Entrance   Number of Stairs, Main Entrance one     Pt admitted with total right hip arthroplasty. CM met with pt at bedside. Pt was alert and oriented to all spheres.  Pt lives with his wife and plans to return there upon discharge. Pt has 1 step to get into their home and 13 steps inside of their home. Pt will have Brandon Wells, to assist in his care. Pt has a walker and a bedside commode. Pt would like to have outpatient physical therapy at [facility]. CM faxed referral to [facility].     Discharge Plan:  Home (Patient/Family Member/other) (code 1)      The patient will continue to be evaluated for developing discharge needs.     Case Manager: Yaakov Guthrie, [place]  Phone: [phone]

## 2022-10-06 ENCOUNTER — Ambulatory Visit (INDEPENDENT_AMBULATORY_CARE_PROVIDER_SITE_OTHER): Payer: Medicare PPO | Admitting: Internal Medicine

## 2022-10-06 ENCOUNTER — Other Ambulatory Visit: Payer: Self-pay

## 2022-10-06 ENCOUNTER — Encounter (INDEPENDENT_AMBULATORY_CARE_PROVIDER_SITE_OTHER): Payer: Self-pay | Admitting: Internal Medicine

## 2022-10-06 VITALS — BP 104/67 | HR 66 | Resp 18 | Ht 68.0 in | Wt 173.0 lb

## 2022-10-06 DIAGNOSIS — I10 Essential (primary) hypertension: Secondary | ICD-10-CM

## 2022-10-06 DIAGNOSIS — Z09 Encounter for follow-up examination after completed treatment for conditions other than malignant neoplasm: Secondary | ICD-10-CM

## 2022-10-06 DIAGNOSIS — G4733 Obstructive sleep apnea (adult) (pediatric): Secondary | ICD-10-CM

## 2022-10-06 DIAGNOSIS — I480 Paroxysmal atrial fibrillation: Secondary | ICD-10-CM

## 2022-10-06 DIAGNOSIS — I5043 Acute on chronic combined systolic (congestive) and diastolic (congestive) heart failure: Secondary | ICD-10-CM

## 2022-10-06 DIAGNOSIS — Z96642 Presence of left artificial hip joint: Secondary | ICD-10-CM

## 2022-10-06 DIAGNOSIS — I2510 Atherosclerotic heart disease of native coronary artery without angina pectoris: Secondary | ICD-10-CM

## 2022-10-06 DIAGNOSIS — L281 Prurigo nodularis: Secondary | ICD-10-CM

## 2022-10-06 MED ORDER — FUROSEMIDE 20 MG TABLET
20.0000 mg | ORAL_TABLET | Freq: Every day | ORAL | 3 refills | Status: DC
Start: 2022-10-06 — End: 2023-11-15

## 2022-10-06 MED ORDER — PANTOPRAZOLE 40 MG TABLET,DELAYED RELEASE
40.0000 mg | DELAYED_RELEASE_TABLET | Freq: Every day | ORAL | 3 refills | Status: DC
Start: 2022-10-06 — End: 2023-07-17

## 2022-10-06 MED ORDER — LISINOPRIL 5 MG TABLET
5.0000 mg | ORAL_TABLET | Freq: Every day | ORAL | 3 refills | Status: DC
Start: 2022-10-06 — End: 2023-11-03

## 2022-10-06 NOTE — Progress Notes (Addendum)
INTERNAL MEDICINE, [facility] [employer]  [address]  [place] [facility] [address]    Transitional Care Management Note    Name: Brandon Wells. MRN:  [id number]   Date: 10/06/2022 Age: 72 y.o.     Chief Complaint: Hospital Discharge Transition and Hospital Discharge Transition       SUBJECTIVE:  Brandon Wells. is a 72 y.o. male presenting today for follow-up after being discharged. The main problem requiring admission was left total hip arthroplasty.  The patient states that he is doing very well.  He describes his pain as being about a 0-2 on a scale of 1-10 and he typically has just been taking an as needed leave once daily.  He is having a problem where he had a rash that was on his back and it is interesting in that the rash was actually dermatome will bilateral and in the area that he can reach.  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  Other than this he is actually doing well.  He is denying any fevers or chills he denies any cough productive sputum.  He denies any dysuria.  He is having a normal bowel movement and he is having no other complaints at this point.      OBJECTIVE:   BP 104/67 (Site: Left, Patient Position: Sitting, Cuff Size: Adult)   Pulse 66   Resp 18   Ht 1.727 m ( )   Wt 78.5 kg (173 lb)   SpO2 96%   BMI 26.30 kg/m          Transition of Care Contact Information  Discharge date: Discharge Date: 09/29/2022  Transition Facility Type--Hospital (Inpatient or Observation)  Facility Name--[facility] Interactive Contact(s):  First Attempt Call: 10/03/2022  1:36 PM  Contact Method(s)-- Patient/Caregiver Telephone  Clinical Staff Name/Role who contacted     Data Reviewed  Medication Reconciliation completed    Assessment and Plan    ICD-10-CM    1. Hospital discharge follow-up  Z09 Exam Performed    Clinical Decision Making was  Moderate on this Visit      2. 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        3. Coronary artery disease involving native coronary artery of native heart without angina pectoris  I25.10 The patient's condition is unchanged from prior.  We have discussed whether there is need for any therapeutic changes and the patient states that they are tolerating current treatments and will just continue with current lifestyle.       4. 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  We have discussed whether there is need for any therapeutic changes and the patient states that they are tolerating current treatments and will just continue with current lifestyle.       5. Essential hypertension  I10 Blood Pressure today is stable and will continue current treatment plans       6. PAF (paroxysmal atrial fibrillation) (CMS HCC)  I48.0 The patient's condition is unchanged from prior.  We have discussed whether there is need for any therapeutic changes and the patient states that they are tolerating current treatments and will just continue with current lifestyle.     Clinically normal sinus rhythm and patient  tolerating heart rate well.       7. OSA on CPAP  G47.33 The patient continues to use their CPAP every evening and is compliant with its use.  They continue to benefit from CPAP use and has had improve lifestyle with this.       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    Lucia Gaskins, DO

## 2022-10-07 ENCOUNTER — Encounter (INDEPENDENT_AMBULATORY_CARE_PROVIDER_SITE_OTHER): Payer: Self-pay | Admitting: Internal Medicine

## 2022-10-23 ENCOUNTER — Other Ambulatory Visit (INDEPENDENT_AMBULATORY_CARE_PROVIDER_SITE_OTHER): Payer: Self-pay | Admitting: Family

## 2022-10-24 ENCOUNTER — Emergency Department (HOSPITAL_COMMUNITY): Payer: Medicare PPO

## 2022-10-24 ENCOUNTER — Emergency Department
Admission: EM | Admit: 2022-10-24 | Discharge: 2022-10-24 | Disposition: A | Discharge status: Home / Self Care | Payer: Medicare PPO | Source: Skilled Nursing Facility | Attending: FAMILY PRACTICE | Admitting: FAMILY PRACTICE

## 2022-10-24 ENCOUNTER — Other Ambulatory Visit: Payer: Self-pay

## 2022-10-24 DIAGNOSIS — I252 Old myocardial infarction: Secondary | ICD-10-CM

## 2022-10-24 DIAGNOSIS — M1990 Unspecified osteoarthritis, unspecified site: Secondary | ICD-10-CM | POA: Insufficient documentation

## 2022-10-24 DIAGNOSIS — S4992XA Unspecified injury of left shoulder and upper arm, initial encounter: Secondary | ICD-10-CM

## 2022-10-24 DIAGNOSIS — M24812 Other specific joint derangements of left shoulder, not elsewhere classified: Secondary | ICD-10-CM | POA: Insufficient documentation

## 2022-10-24 DIAGNOSIS — I10 Essential (primary) hypertension: Secondary | ICD-10-CM

## 2022-10-24 DIAGNOSIS — R55 Syncope and collapse: Secondary | ICD-10-CM

## 2022-10-24 DIAGNOSIS — W109XXA Fall (on) (from) unspecified stairs and steps, initial encounter: Secondary | ICD-10-CM | POA: Insufficient documentation

## 2022-10-24 DIAGNOSIS — E785 Hyperlipidemia, unspecified: Secondary | ICD-10-CM | POA: Insufficient documentation

## 2022-10-24 LAB — CBC WITH DIFF
BASOPHIL #: 0 10*3/uL (ref 0.00–0.10)
BASOPHIL %: 1 % (ref 0–1)
EOSINOPHIL #: 0.1 10*3/uL (ref 0.00–0.50)
EOSINOPHIL %: 1 %
HCT: 35.7 % — ABNORMAL LOW (ref 36.7–47.1)
HGB: 12.4 g/dL — ABNORMAL LOW (ref 12.5–16.3)
LYMPHOCYTE #: 0.5 10*3/uL — ABNORMAL LOW (ref 1.00–3.00)
LYMPHOCYTE %: 9 % — ABNORMAL LOW (ref 16–44)
MCH: 32.2 pg (ref 23.8–33.4)
MCHC: 34.8 g/dL (ref 32.5–36.3)
MCV: 92.6 fL (ref 73.0–96.2)
MONOCYTE #: 0.4 10*3/uL (ref 0.30–1.00)
MONOCYTE %: 6 % (ref 5–13)
MPV: 7.9 fL (ref 7.4–11.4)
NEUTROPHIL #: 5.3 10*3/uL (ref 1.85–7.80)
NEUTROPHIL %: 84 % — ABNORMAL HIGH (ref 43–77)
PLATELETS: 161 10*3/uL (ref 140–440)
RBC: 3.86 10*6/uL — ABNORMAL LOW (ref 4.06–5.63)
RDW: 13.8 % (ref 12.1–16.2)
WBC: 6.3 10*3/uL (ref 3.6–10.2)

## 2022-10-24 LAB — COMPREHENSIVE METABOLIC PANEL, NON-FASTING
ALBUMIN/GLOBULIN RATIO: 1.6 — ABNORMAL HIGH (ref 0.8–1.4)
ALBUMIN: 3.9 g/dL (ref 3.5–5.7)
ALKALINE PHOSPHATASE: 92 U/L (ref 34–104)
ALT (SGPT): 11 U/L (ref 7–52)
ANION GAP: 5 mmol/L (ref 4–13)
AST (SGOT): 13 U/L (ref 13–39)
BILIRUBIN TOTAL: 0.4 mg/dL (ref 0.3–1.2)
BUN/CREA RATIO: 18 (ref 6–22)
BUN: 20 mg/dL (ref 7–25)
CALCIUM, CORRECTED: 8.9 mg/dL (ref 8.9–10.8)
CALCIUM: 8.8 mg/dL (ref 8.6–10.3)
CHLORIDE: 107 mmol/L (ref 98–107)
CO2 TOTAL: 28 mmol/L (ref 21–31)
CREATININE: 1.13 mg/dL (ref 0.60–1.30)
ESTIMATED GFR: 70 mL/min/{1.73_m2} (ref >59)
GLOBULIN: 2.4 — ABNORMAL LOW (ref 2.9–5.4)
GLUCOSE: 173 mg/dL — ABNORMAL HIGH (ref 74–109)
OSMOLALITY, CALCULATED: 286 mOsm/kg (ref 270–290)
POTASSIUM: 4.2 mmol/L (ref 3.5–5.1)
PROTEIN TOTAL: 6.3 g/dL — ABNORMAL LOW (ref 6.4–8.9)
SODIUM: 140 mmol/L (ref 136–145)

## 2022-10-24 LAB — DRUG SCREEN, NO CONFIRMATION, URINE
AMPHET QL: NEGATIVE
BARB QL: NEGATIVE
BENZO QL: NEGATIVE
BUP QL: NEGATIVE
CANNAQL: NEGATIVE
COCQL: NEGATIVE
FENTANYL, RANDOM URINE: NEGATIVE
METHQL: NEGATIVE
OPIATE: NEGATIVE
OXYCODONE URINE: NEGATIVE
PCP QL: NEGATIVE

## 2022-10-24 LAB — URINALYSIS, MACROSCOPIC
BILIRUBIN: NEGATIVE mg/dL
BLOOD: NEGATIVE mg/dL
GLUCOSE: NEGATIVE mg/dL
KETONES: NEGATIVE mg/dL
LEUKOCYTES: NEGATIVE WBCs/uL
NITRITE: NEGATIVE
PH: 6.5 (ref 5.0–9.0)
PROTEIN: 10 mg/dL
SPECIFIC GRAVITY: 1.02 (ref 1.002–1.030)
UROBILINOGEN: 2 mg/dL — AB

## 2022-10-24 LAB — URINALYSIS, MICROSCOPIC: RBCS: 1 /hpf (ref <4)

## 2022-10-24 LAB — TROPONIN-I: TROPONIN I: 16 ng/L (ref <20)

## 2022-10-24 LAB — MAGNESIUM: MAGNESIUM: 1.5 mg/dL — ABNORMAL LOW (ref 1.9–2.7)

## 2022-10-24 LAB — PT/INR
INR: 1.13 — ABNORMAL HIGH (ref 0.84–1.10)
PROTHROMBIN TIME: 13.2 seconds — ABNORMAL HIGH (ref 9.8–12.7)

## 2022-10-24 LAB — GOLD TOP TUBE

## 2022-10-24 MED ORDER — MAGNESIUM SULFATE 1 GRAM/100 ML IN DEXTROSE 5 % INTRAVENOUS PIGGYBACK
INJECTION | INTRAVENOUS | Status: AC
Start: 2022-10-24 — End: 2022-10-24
  Filled 2022-10-24: qty 200

## 2022-10-24 MED ORDER — OXYCODONE-ACETAMINOPHEN 5 MG-325 MG TABLET
1.0000 | ORAL_TABLET | ORAL | Status: AC
Start: 2022-10-24 — End: 2022-10-24
  Administered 2022-10-24: 1 via ORAL

## 2022-10-24 MED ORDER — OXYCODONE-ACETAMINOPHEN 5 MG-325 MG TABLET
ORAL_TABLET | ORAL | Status: AC
Start: 2022-10-24 — End: 2022-10-24
  Filled 2022-10-24: qty 1

## 2022-10-24 MED ORDER — MAGNESIUM SULFATE 1 GRAM/100 ML IN DEXTROSE 5 % INTRAVENOUS PIGGYBACK
1.0000 g | INJECTION | INTRAVENOUS | Status: AC
Start: 2022-10-24 — End: 2022-10-24
  Administered 2022-10-24 (×2): 0 g via INTRAVENOUS
  Administered 2022-10-24 (×2): 1 g via INTRAVENOUS

## 2022-10-24 NOTE — ED Triage Notes (Signed)
Patient was being seen at [facility] for shoulder and hip pain when he got diaphoretic, clammy, and low BP. Patient denies CP. Recent L hip surgery, missed a step and landed on left side this afternoon. Left shoulder pain since the fall.      PRS: 20GRFA, monitor, vitals, BS 136, 12lead

## 2022-10-24 NOTE — ED Provider Notes (Signed)
[facility]  ED Primary Provider Note  History of Present Illness   Chief Complaint   Patient presents with    Near Syncope Episode    Shoulder Pain     Brandon Wells. is a 72 y.o. male who had concerns including Near Syncope Episode and Shoulder Pain.  Arrival: The patient arrived by Ambulance    This 72 year old male patient presents emergency department today after going to [facility] to be evaluated when he suffered a fall at home hitting his right hip and shoulder.  When you presented at [facility] when he became cold, clammy, diaphoretic and weak with a low blood pressure, and they sent him here.  He denies chest pain, pressure, heaviness, palpitations or tachycardia prior to or surrounding this event.  Patient's stay he was going down 3 steps, was at the 2nd step and fell while carrying a small 1 step ladder, hitting his left shoulder and hip.  He was complaining of pain in the shoulder, concerned for dislocation.  Says he was pain described as a soreness sensation lateral aspect of the hip but no deformity and was able to ambulate here.  History of high blood pressure, hyperlipidemia, myocardial infarction, and osteoarthritis.  Surgical history includes left heart catheterization with stent placement, left Achilles tendon repair, bilateral inguinal herniorrhaphy, left hip arthroplasty last month.  No tobacco, vaping, alcohol, marijuana or illicit drug use.  Did reviewed CD of the left shoulder and left hip over with the patient from [facility], good alignment in the hip, no dislocation, left shoulder no fracture dislocation.    He was Pfizer COVID vaccinated with no boosters.  He was had COVID once.      History Reviewed This Encounter:  Patient's past medical, surgical, social history reviewed noted.    Physical Exam   ED Triage Vitals [10/24/22 1737]   BP (Non-Invasive) 107/70   Heart Rate 58   Respiratory Rate 16   Temperature 36.7 C (98.1 F)   SpO2 96 %   Weight  79.4 kg (175 lb)   Height 1.727 m (5\' 8" )     Physical Exam  General:  No acute distress nontoxic   Head:  Normocephalic atraumatic   Ears:  TMs clear throat for traction   Nasal:  Pink moist   Oral:  Pink and moist   Pharynx:  Pink and moist   Neck:  Supple, no spinous process tenderness, crepitus, deformity or abnormal motion.  Trachea is in midline.  Chest wall:  Atraumatic  Lungs:  Clear symmetrical   Heart: Regular rate rhythm without murmur gallop   Abdomen:  Soft normal bowel sounds nontender   Extremities:  Moving symmetrically with the exception of the left shoulder.  Left shoulder:  No obvious deformities or crepitus.  No clavicular pain, deformity or crepitus no acromial crepitus deformity or pain.  Does have tenderness with palpation of the lateral aspect of the deltoid and anterior aspect of the deltoid muscle.  Good motor sensory distally.  Range of motion of the shoulder, can not abduct, flex, extend the shoulder without significant pain.  Passive range of motion you can abduct to about 80, flex to about 60, little extension.  Rotation of the shoulders also painful.  Vascular:  2/4 radial pulse, capillary refill brisk.  Left hip:  Mild tenderness to palpation, no crepitus, range of motion good.    Patient Data     Labs Ordered/Reviewed   COMPREHENSIVE METABOLIC PANEL, NON-FASTING - Abnormal;  Notable for the following components:       Result Value    GLUCOSE 173 (*)     PROTEIN TOTAL 6.3 (*)     ALBUMIN/GLOBULIN RATIO 1.6 (*)     GLOBULIN 2.4 (*)     All other components within normal limits    Narrative:     Estimated Glomerular Filtration Rate (eGFR) is calculated using the CKD-EPI (2021) equation, intended for patients 23 years of age and older. If gender is not documented or "unknown", there will be no eGFR calculation.     MAGNESIUM - Abnormal; Notable for the following components:    MAGNESIUM 1.5 (*)     All other components within normal limits   PT/INR - Abnormal; Notable for the following  components:    PROTHROMBIN TIME 13.2 (*)     INR 1.13 (*)     All other components within normal limits    Narrative:     INR OF 2.0-3.0  RECOMMENDED FOR: PROPHYLAXIS/TREATMENT OF VENEOUS THROMBOSIS, PULMONARY EMBOLISM, PREVENTION OF SYSTEMIC EMBOLISM FROM ATRIAL FIBRILATION, MYOCARDIAL INFARCTION.    INR OF 2.5-3.5  RECOMMENDED FOR MECHANICAL PROSTHETIC HEART VALVES, RECURRENT SYSTEMIC EMBOLISM, RECURRENT MYOCARDIAL INFARCTION.     CBC WITH DIFF - Abnormal; Notable for the following components:    RBC 3.86 (*)     HGB 12.4 (*)     HCT 35.7 (*)     NEUTROPHIL % 84 (*)     LYMPHOCYTE % 9 (*)     LYMPHOCYTE # 0.50 (*)     All other components within normal limits   URINALYSIS, MACROSCOPIC - Abnormal; Notable for the following components:    UROBILINOGEN 2 (*)     All other components within normal limits   URINALYSIS, MICROSCOPIC - Abnormal; Notable for the following components:    MUCOUS Rare (*)     All other components within normal limits   TROPONIN-I - Normal   DRUG SCREEN, NO CONFIRMATION, URINE - Normal    Narrative:     Any results reported as "positive" on this urine drug screen are unconfirmed screening results and should be used for medical(i.e.,treatment)purposes only. Unconfirmed screening results must not be used for non-medical purposes (e.g. employment or legal testing). Upon request, all results reported as "positive" can be sent to a reference laboratory for confirmation by GCMS.     Reporting Limits (cut-off concentrations)     Cocaine 300 ng/mL  Opiates 300 ng/mL  THC 50 ng/mL  Amphetamine 1000 ng/mL  Phencyclidine 25 ng/mL  Benzodiazepine 300 ng/mL  Barbiturates 300 ng/mL  Methadone 300 ng/mL  Oxycodone 100 ng/mL  Buprenorphine 5 ng/mL  Fentanyl 5 ng/mL     CBC/DIFF    Narrative:     The following orders were created for panel order CBC/DIFF.  Procedure                               Abnormality         Status                     ---------                               -----------         ------  CBC WITH DIFF[[id number]]                Abnormal            Final result                 Please view results for these tests on the individual orders.   URINALYSIS, MACROSCOPIC AND MICROSCOPIC W/CULTURE REFLEX    Narrative:     The following orders were created for panel order URINALYSIS, MACROSCOPIC AND MICROSCOPIC W/CULTURE REFLEX.  Procedure                               Abnormality         Status                     ---------                               -----------         ------                     URINALYSIS, MACROSCOPIC[[id number]]      Abnormal            Final result               URINALYSIS, MICROSCOPIC[[id number]]      Abnormal            Final result                 Please view results for these tests on the individual orders.   EXTRA TUBES    Narrative:     The following orders were created for panel order EXTRA TUBES.  Procedure                               Abnormality         Status                     ---------                               -----------         ------                     GOLD TOP TUBE[[id number]]                                    In process                   Please view results for these tests on the individual orders.   GOLD TOP TUBE     XR AP MOBILE CHEST   Final Result by Edi, [facility] In (05/06 1855)   NO ACUTE FINDINGS.            Radiologist location ID: [id number]           Medical Decision Making        Medical Decision Making  Moderate complexity, differential includes dislocation, fracture of the humeral head, humeral neck, glenoid, acromial process.  Radiographs showed no acute dislocations or fractures, chest x-ray with his  cardiac history and syncopal episode showed cardiomegaly, cardiac labs normal electrolytes were normal.    You have a shoulder immobilizer placed, likely has a rotator cuff disruption, to see his primary care provider in the morning and also needs to see physical therapy for an evaluation, as he likely has 20 rotator cuff and this  may help fast track him to orthopedics and intervention.    Amount and/or Complexity of Data Reviewed  Labs:      Details: Labs reviewed and noted  Radiology:      Details: Chest x-ray reviewed and noted    Left shoulder and hip x-rays from [facility] reviewed wet read showed no acute process.  ECG/medicine tests:      Details: Reviewed and noted, LVH with strain    Risk  Prescription drug management.                Medications Administered in the ED   magnesium sulfate 1 G in D5W 100 mL premix IVPB (1 g Intravenous New Bag/New Syringe 10/24/22 2200)   oxyCODONE-acetaminophen (PERCOCET) 5-325mg  per tablet (has no administration in time range)     Clinical Impression   Vagal reaction (Primary)   Hypomagnesemia   Essential hypertension   Fall on stairs, initial encounter   History of MI (myocardial infarction)   Internal derangement of shoulder, left       Disposition: Discharged       .Marland KitchenSondra Come, DO

## 2022-10-24 NOTE — ED Nurses Note (Addendum)
Patient discharged home with family.  AVS reviewed with patient/care giver.  A written copy of the AVS and discharge instructions was given to the patient/care giver. Scripts handed to patient/care giver. Questions sufficiently answered as needed.  Patient/care giver encouraged to follow up with PCP as indicated.  In the event of an emergency, patient/care giver instructed to call 911 or go to the nearest emergency room.     Shoulder immobilizer placed on left shoulder prior to d/c

## 2022-10-24 NOTE — Discharge Instructions (Addendum)
You likely had a vasovagal syndrome, cardiac: Without murmur, electrolytes were fine.  Your magnesium was found to be low at 1.5 in his being replaced.    Chest x-ray showed no acute processes.  Review of your x-ray from [facility], and the chest x-ray shows no humerus fracture or dislocation of the left shoulder.  Follow up with the primary care provider tomorrow as scheduled, physical therapy for evaluation to fast track he was injury to therapy or intervention if needed.  Shoulder mobilizer was placed this evening, which should help you rest.    Continue home medications as prescribed, call follow-up with your primary care provider.  Call your orthopedist if needed.

## 2022-10-25 ENCOUNTER — Encounter (INDEPENDENT_AMBULATORY_CARE_PROVIDER_SITE_OTHER): Payer: Self-pay | Admitting: Internal Medicine

## 2022-10-25 ENCOUNTER — Telehealth (INDEPENDENT_AMBULATORY_CARE_PROVIDER_SITE_OTHER): Payer: Self-pay | Admitting: Internal Medicine

## 2022-10-25 ENCOUNTER — Ambulatory Visit (INDEPENDENT_AMBULATORY_CARE_PROVIDER_SITE_OTHER): Payer: Medicare PPO | Admitting: Internal Medicine

## 2022-10-25 VITALS — BP 123/74 | HR 60 | Resp 16 | Ht 68.0 in | Wt 178.0 lb

## 2022-10-25 DIAGNOSIS — I480 Paroxysmal atrial fibrillation: Secondary | ICD-10-CM

## 2022-10-25 DIAGNOSIS — R55 Syncope and collapse: Secondary | ICD-10-CM

## 2022-10-25 DIAGNOSIS — Z955 Presence of coronary angioplasty implant and graft: Secondary | ICD-10-CM

## 2022-10-25 DIAGNOSIS — M25512 Pain in left shoulder: Secondary | ICD-10-CM

## 2022-10-25 DIAGNOSIS — Z0000 Encounter for general adult medical examination without abnormal findings: Secondary | ICD-10-CM

## 2022-10-25 DIAGNOSIS — I213 ST elevation (STEMI) myocardial infarction of unspecified site: Secondary | ICD-10-CM

## 2022-10-25 DIAGNOSIS — I10 Essential (primary) hypertension: Secondary | ICD-10-CM

## 2022-10-25 DIAGNOSIS — I2510 Atherosclerotic heart disease of native coronary artery without angina pectoris: Secondary | ICD-10-CM

## 2022-10-25 DIAGNOSIS — R9431 Abnormal electrocardiogram [ECG] [EKG]: Secondary | ICD-10-CM

## 2022-10-25 DIAGNOSIS — G4733 Obstructive sleep apnea (adult) (pediatric): Secondary | ICD-10-CM

## 2022-10-25 DIAGNOSIS — Z8679 Personal history of other diseases of the circulatory system: Secondary | ICD-10-CM

## 2022-10-25 DIAGNOSIS — Z0001 Encounter for general adult medical examination with abnormal findings: Secondary | ICD-10-CM

## 2022-10-25 LAB — ECG 12 LEAD
Atrial Rate: 71 {beats}/min
Calculated P Axis: 45 degrees
Calculated R Axis: -3 degrees
Calculated T Axis: 175 degrees
PR Interval: 166 ms
QRS Duration: 84 ms
QT Interval: 406 ms
QTC Calculation: 441 ms
Ventricular rate: 71 {beats}/min

## 2022-10-25 MED ORDER — METHOCARBAMOL 750 MG TABLET
750.0000 mg | ORAL_TABLET | Freq: Three times a day (TID) | ORAL | 1 refills | Status: DC | PRN
Start: 2022-10-25 — End: 2022-11-18

## 2022-10-25 NOTE — Progress Notes (Addendum)
[facility]  [address]  [place] [address]       Name: Brandon Wells. MRN:  [id number]   Date: 10/25/2022 Age: 72 y.o.       Chief Complaint:    Chief Complaint   Patient presents with    Medicare Annual    Follow Up     ER--Lt Rotator Cuff Tear.        HPI:  Brandon Pellett. is a 72 y.o. male who presents to the office today for follow-up.  Patient is actually doing relatively well and except for minor issues as described below which I have addressed in total with the patient.        Past Medical History:  Past Medical History:   Diagnosis Date    Allergic rhinitis     Arthritis     Back problem     CPAP (continuous positive airway pressure) dependence     Esophageal reflux     Hypercholesterolemia     Hyperlipidemia     Hypertension     Myocardial infarction (CMS HCC)     Peripheral edema     Personal history of venous thrombosis and embolism     Renal calculi     Sleep apnea     Stented coronary artery     Wears glasses          Past Surgical History:   Procedure Laterality Date    ACHILLES TENDON REPAIR Left     HX CORONARY STENT PLACEMENT      HX HERNIA REPAIR Bilateral     inguinal    HX HIP REPLACEMENT Left 09/28/2022      Current Outpatient Medications   Medication Sig    acetaminophen (TYLENOL ARTHRITIS PAIN) 650 mg Oral Tablet Sustained Release Take 1 Tablet (650 mg total) by mouth Every 8 hours as needed for Pain    aspirin (ECOTRIN) 81 mg Oral Tablet, Delayed Release (E.C.) Take 1 Tablet (81 mg total) by mouth Twice daily X 6 weeks    atorvastatin (LIPITOR) 80 mg Oral Tablet Take 1 Tablet (80 mg total) by mouth Every night    cholecalciferol, vitamin D3, 25 mcg (1,000 unit) Oral Tablet Take 2 Tablets (2,000 Units total) by mouth Once a day    clopidogreL (PLAVIX) 75 mg Oral Tablet Take 1 Tablet (75 mg total) by mouth Once a day    cyanocobalamin (VITAMIN B 12) 1,000 mcg Oral Tablet Take 1 Tablet (1,000 mcg total) by mouth Once a day    furosemide  (LASIX) 20 mg Oral Tablet Take 1 Tablet (20 mg total) by mouth Once a day    lisinopriL (PRINIVIL) 5 mg Oral Tablet Take 1 Tablet (5 mg total) by mouth Once a day    methocarbamoL (ROBAXIN) 750 mg Oral Tablet Take 1 Tablet (750 mg total) by mouth Three times a day as needed    metoprolol succinate (TOPROL-XL) 25 mg Oral Tablet Sustained Release 24 hr Take 1 Tablet (25 mg total) by mouth Once a day    mometasone (NASONEX) 50 mcg/actuation Nasal Spray, Non-Aerosol ADMINISTER 2 SPRAYS INTO AFFECTED NOSTRIL(S) ONCE PER DAY AS NEEDED    pantoprazole (PROTONIX) 40 mg Oral Tablet, Delayed Release (E.C.) Take 1 Tablet (40 mg total) by mouth Once a day     Allergies   Allergen Reactions    Hydrocodone Rash    Prednisone  Other Adverse Reaction (Add comment)  Insomnia       Family History:  Family Medical History:       Problem Relation (Age of Onset)    Bone cancer Father    Esophageal cancer Mother    Heart Disease Brother    Lung Cancer Mother              Social History:   Social History     Tobacco Use   Smoking Status Never   Smokeless Tobacco Never     Social History     Substance and Sexual Activity   Alcohol Use Not Currently    Comment: occasionally     Social History     Occupational History    Not on file       Review of Systems:  Review of systems as discussed in HPI    Problem List:  Patient Active Problem List   Diagnosis    STEMI (ST elevation myocardial infarction) (CMS HCC)    Coronary artery disease involving native coronary artery of native heart without angina pectoris    Volume overload    Systolic and diastolic CHF, 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   Constitutional:       General: He is not in acute distress.     Appearance: Normal appearance.   HENT:      Head: Normocephalic.      Right Ear: Tympanic membrane normal.      Left Ear: Tympanic membrane normal.      Nose: Nose normal.      Mouth/Throat:      Mouth: Mucous membranes are moist.   Eyes:      General: No scleral icterus.     Extraocular Movements: Extraocular movements intact.      Conjunctiva/sclera: Conjunctivae normal.      Pupils: Pupils are equal, round, and reactive to light.   Neck:      Vascular: No carotid bruit.   Cardiovascular:      Rate and Rhythm: Normal rate and regular rhythm.      Pulses: Normal pulses.           Dorsalis pedis pulses are 2+ on the right side and 2+ on the left side.        Posterior tibial pulses are 2+ on the right side and 2+ on the left side.      Heart sounds: Normal heart sounds.   Pulmonary:      Effort: Pulmonary effort is normal.      Breath sounds: Normal breath sounds. No wheezing, rhonchi or rales.   Abdominal:      General: Abdomen is flat. Bowel sounds are normal.  Tenderness: There is no abdominal tenderness. There is no guarding.   Musculoskeletal:         General: No swelling.      Left shoulder: Deformity, tenderness and crepitus present. Decreased range of motion. Decreased strength.      Cervical back: Normal range of motion. No tenderness.      Right lower leg: No edema.      Left lower leg: No edema.   Skin:     General: Skin is warm.      Capillary Refill: Capillary refill takes less than 2 seconds.   Neurological:      General: No focal deficit present.      Mental Status: He is alert and oriented to person, place, and time.      Cranial Nerves: No cranial nerve deficit.        Health Maintenance:  Health Maintenance    Topic Date Due    Adult Tdap-Td (1 - Tdap) 04/26/2023 (Originally 02/29/1970)    Shingles Vaccine (1 of 2) 04/26/2023 (Originally 03/01/2003)    Covid-19 Vaccine (4 - 2023-24 season) 04/26/2023 (Originally 02/18/2022)    Pneumococcal Vaccination, Age 26+ (1 of 2 - PCV) 04/26/2023 (Originally 03/01/1959)    Influenza Vaccine (Season Ended) 02/19/2023    Colonoscopy  03/01/2023    Depression Screening  10/25/2023    Medicare Annual Wellness Visit - Calendar Year Insurers  Completed    Meningococcal Vaccine  Aged Out    Hepatitis C screening  Discontinued        Assessment:    I have reviewed the most recent labs with the patient and all questions answered to patients satisfaction.       ICD-10-CM    1. Encounter for Medicare annual wellness exam  Z00.00 Exam Performed      2. Acute pain of left shoulder  M25.512 My concern is secondary to the fact that the patient is unable to move his arm any in a reduction or flexion and extension of the shoulder on the left that he truly may have either a dislocation or an occult fracture or rotator cuff tendon damage.  I have recommended that we perform an MRI shoulder and I have discussed with her his orthopedic group that is okay for him to have an MRI this soon after his hip replacement    MRI SHOULDER LEFT WO CONTRAST      3. Coronary artery disease involving native coronary artery of native heart without angina pectoris  I25.10 The patient has had improvements in their symptoms with current treatment plans as described above.  At this point after a lengthy discussion the patient feels that they are stable to a reasonable extent and no further treatments at this point are required.  We will obviously re-evaluate this on their next visit and I have informed them that if there is any worsening in their symptoms to call the office early for further treatment options.       4. Essential hypertension  I10 Blood Pressure today is stable and will continue current treatment plans        5. History of atrial fibrillation  Z86.79 The patient has had improvements in their symptoms with current treatment plans as described above.  At this point after a lengthy discussion the patient feels that they are stable to a reasonable extent and no further treatments at this point are required.  We will obviously re-evaluate this on their next visit and I have informed them  that if there is any worsening in their symptoms to call the office early for further treatment options.       6. History of placement of stent in LAD coronary artery  Z95.5 The patient has had improvements in their symptoms with current treatment plans as described above.  At this point after a lengthy discussion the patient feels that they are stable to a reasonable extent and no further treatments at this point are required.  We will obviously re-evaluate this on their next visit and I have informed them that if there is any worsening in their symptoms to call the office early for further treatment options.       7. PAF (paroxysmal atrial fibrillation) (CMS HCC)  I48.0 Condition stable will continue current therapy.        8. STEMI (ST elevation myocardial infarction) (CMS HCC)  I21.3 Condition stable will continue current therapy.        9. OSA on CPAP  G47.33 The patient continues to use their CPAP every evening and is compliant with its use.  They continue to benefit from CPAP use and has had improve lifestyle with this.          Orders Placed This Encounter    MRI SHOULDER LEFT WO CONTRAST    methocarbamoL (ROBAXIN) 750 mg Oral Tablet       Depression screening is negative. PHQ 2 Total: 0       Follow up:  Return in about 2 weeks (around 11/08/2022).    This note was partially created using voice recognition software and is inherently subject to errors including those of syntax and "sound alike " substitutions which may escape proof reading.  In such instances, original meaning may be extrapolated by contextual derivation.    Lucia Gaskins, DO  10/25/2022, 10:18      [facility]  [address]  [place] [address]    Medicare Annual Wellness Visit    Name: Brandon Radach. MRN:  [id number]   Date: 10/25/2022 Age: 72 y.o.       SUBJECTIVE:   Brandon Tainter. is a 72 y.o. male for presenting for Medicare Wellness exam.   I have reviewed and reconciled the medication list with the patient today.        10/25/2022     9:33 AM   Comprehensive Health Assessment-Adult   Do you wish to complete this form? Yes   During the past 4 weeks, how would you rate your health in general? Good   During the past 4 weeks, how much difficulty have you had doing your usual activities inside and outside your home because of medical or emotional problems? No difficulty at all   During the past 4 weeks, was someone available to help you if you needed and wanted help? Yes, as much as I wanted   In the past year, how many times have you gone to the emergency department or been admitted to a hospital for a health problem? 2-4 times   Are you generally satisfied with your sleep? Yes   Do you have enough money to buy things you need in everyday life, such as food, clothing, medicines, and housing? Yes, always   Can you get to places beyond walking distance without help?  (For example, can you drive your own car or travel alone on buses)? Yes   Do you fasten your seatbelt when you are in a car?  Yes, usually   Do you exercise 20 minutes 3 or more days per week (such as walking, dancing, biking, mowing grass, swimming)? No, I usually don't exercise this much   How often do you eat food that is healthy (fruits, vegetables, lean meats) instead of unhealthy (sweets, fast food, junk food, fatty foods)? Most of the time   How often do you have trouble taking medicines the eay you are told to take them? I always take them as prescribed   Do you need any help communicating with your doctors and nurses because of vision or hearing problems? No    During the past 12 months, have you experienced confusion or memory loss that is happening more often or is getting worse? No   Do you have one person you think of as your personal doctor (primary care provider or family doctor)? Yes   If you are seeing a Primary Care Provider (PCP) or family doctor. please list their name Dr. Lacey Jensen   Are you now also seeing any specialist physician(s) (such as eye doctor, foot doctor, skin doctor)? Yes   If you are seeing a specialist for anything such as foot, eye, skin, etc.  please list their name(s) Cardiology--Orthopedic--   How confident are you that you can control or manage most of your health problems? Very confident         I have reviewed and updated as appropriate the past medical, family and social history. 10/25/2022 as summarized below:  Past Medical History:   Diagnosis Date    Allergic rhinitis     Arthritis     Back problem     CPAP (continuous positive airway pressure) dependence     Esophageal reflux     Hypercholesterolemia     Hyperlipidemia     Hypertension     Myocardial infarction (CMS HCC)     Peripheral edema     Personal history of venous thrombosis and embolism     Renal calculi     Sleep apnea     Stented coronary artery     Wears glasses      Past Surgical History:   Procedure Laterality Date    Achilles tendon repair Left     Hx coronary stent placement      Hx hernia repair Bilateral     Hx hip replacement Left 09/28/2022     Current Outpatient Medications   Medication Sig    acetaminophen (TYLENOL ARTHRITIS PAIN) 650 mg Oral Tablet Sustained Release Take 1 Tablet (650 mg total) by mouth Every 8 hours as needed for Pain    aspirin (ECOTRIN) 81 mg Oral Tablet, Delayed Release (E.C.) Take 1 Tablet (81 mg total) by mouth Twice daily X 6 weeks    atorvastatin (LIPITOR) 80 mg Oral Tablet Take 1 Tablet (80 mg total) by mouth Every night    cholecalciferol, vitamin D3, 25 mcg (1,000 unit) Oral Tablet Take 2 Tablets (2,000 Units total) by mouth Once a  day    clopidogreL (PLAVIX) 75 mg Oral Tablet Take 1 Tablet (75 mg total) by mouth Once a day    cyanocobalamin (VITAMIN B 12) 1,000 mcg Oral Tablet Take 1 Tablet (1,000 mcg total) by mouth Once a day    furosemide (LASIX) 20 mg Oral Tablet Take 1 Tablet (20 mg total) by mouth Once a day    lisinopriL (PRINIVIL) 5 mg Oral Tablet Take 1 Tablet (5 mg total) by mouth Once a day    methocarbamoL (  ROBAXIN) 750 mg Oral Tablet Take 1 Tablet (750 mg total) by mouth Three times a day as needed    metoprolol succinate (TOPROL-XL) 25 mg Oral Tablet Sustained Release 24 hr Take 1 Tablet (25 mg total) by mouth Once a day    mometasone (NASONEX) 50 mcg/actuation Nasal Spray, Non-Aerosol ADMINISTER 2 SPRAYS INTO AFFECTED NOSTRIL(S) ONCE PER DAY AS NEEDED    pantoprazole (PROTONIX) 40 mg Oral Tablet, Delayed Release (E.C.) Take 1 Tablet (40 mg total) by mouth Once a day     Family Medical History:       Problem Relation (Age of Onset)    Bone cancer Father    Esophageal cancer Mother    Heart Disease Brother    Lung Cancer Mother            Social History     Socioeconomic History    Marital status: Married   Tobacco Use    Smoking status: Never    Smokeless tobacco: Never   Vaping Use    Vaping status: Never Used   Substance and Sexual Activity    Alcohol use: Not Currently     Comment: occasionally    Drug use: Never     Social Determinants of Health     Financial Resource Strain: Low Risk  (08/09/2022)    Financial Resource Strain     SDOH Financial: No   Transportation Needs: Low Risk  (08/09/2022)    Transportation Needs     SDOH Transportation: No   Social Connections: Low Risk  (09/28/2022)    Social Connections     SDOH Social Isolation: 5 or more times a week   Intimate Partner Violence: Low Risk  (08/09/2022)    Intimate Partner Violence     SDOH Domestic Violence: No   Housing Stability: Low Risk  (08/09/2022)    Housing Stability     SDOH Housing Situation: I have housing.     SDOH Housing Worry: No   Health Literacy: Low  Risk  (09/28/2022)    Health Literacy     SDOH Health Literacy: Never   Employment Status: Low Risk  (08/09/2022)    Employment Status     SDOH Employment: Otherwise unemployed but not seeking work (ex. Consulting civil engineer, retired, disabled, unpaid primary care giver)         List of Current Health Care Providers   Care Team       PCP       Name Type Specialty Phone Number    Lucia Gaskins, DO Physician [facility] [phone]              Care Team       No care team found                      Health Maintenance   Topic Date Due    Adult Tdap-Td (1 - Tdap) 04/26/2023 (Originally [birth year]1972)    Shingles Vaccine (1 of 2) 04/26/2023 (Originally 03/01/2003)    Covid-19 Vaccine (4 - 2023-24 season) 04/26/2023 (Originally 02/18/2022)    Pneumococcal Vaccination, Age 2+ (1 of 2 - PCV) 04/26/2023 (Originally 03/01/1959)    Influenza Vaccine (Season Ended) 02/19/2023    Colonoscopy  03/01/2023    Depression Screening  10/25/2023    Medicare Annual Wellness Visit - Calendar Year Insurers  Completed    Meningococcal Vaccine  Aged Out    Hepatitis C screening  Discontinued     Medicare Wellness Assessment  Medicare initial or wellness physical in the last year?: Yes  Advance Directives   Does patient have a living will or MPOA: No           Advance directive information given to the patient today?: Patient Declined      Activities of Daily Living   Do you need help with dressing, bathing, or walking?: No   Do you need help with shopping, housekeeping, medications, or finances?: No   Do you have rugs in hallways, broken steps, or poor lighting?: No   Do you have grab bars in your bathroom, non-slip strips in your tub, and hand rails on your stairs?: No   Cognitive Function Screen (1=Yes, 0=No)   What is you age?: Correct   What is the time to the nearest hour?: Correct   What is the year?: Correct   What is the name of this clinic?: Correct   Can the patient recognize two persons (the doctor, the nurse, home help, etc.)?: Correct   What  is the date of your birth? (day and month sufficient) : Correct   In what year did World War II end?: Correct   Who is the current president of the [place]?: Correct   Count from 20 down to 1?: Correct   What address did I give you earlier?: Correct   Total Score: 10       Fall Risk Screen   Do you feel unsteady when standing or walking?: No  Do you worry about falling?: No  Have you fallen in the past year?: Yes  How many times have you fallen?: Once  Were you ever injured from falling?: Yes  Timed up and go test (in seconds): 15   Depression Screen     Little interest or pleasure in doing things.: Not at all  Feeling down, depressed, or hopeless: Not at all  PHQ 2 Total: 0     Pain Score   Pain Score:   5    Substance Use-Abuse Screening     Tobacco Use     In Past 12 MONTHS, how often have you used any tobacco product (for example, cigarettes, e-cigarettes, cigars, pipes, or smokeless tobacco)?: Never     Alcohol use     In the PAST 12 MONTHS, how often have you had 5 (men)/4 (women) or more drinks containing alcohol in one day?: Never     Prescription Drug Use     In the PAST 12 months, how often have you used any prescription medications just for the feeling, more than prescribed, or that were not prescribed for you? Prescriptions may include: opioids, benzodiazepines, medications for ADHD: Never           Illicit Drug Use   In the PAST 12 MONTHS, how often have you used any drugs, including marijuana, cocaine or crack, heroin, methamphetamine, hallucinogens, ecstasy/MDMA?: Never        Hearing Screen   Have you noticed any hearing difficulties?: No  After whispering 9-1-6 how many numbers did the patient repeat correctly?: 3  After whispering 4-7-8 how many numbers did the patient repeat correctly?: 3       Vision Screen             Urine Incontinence Screen   Urinary Incontinence Screen  Do you ever leak urine when you don't want to?: No       OBJECTIVE:   BP 123/74 (Site: Right, Patient Position:  Sitting, Cuff Size: Adult)  Pulse 60   Resp 16   Ht 1.727 m (5\' 8" )   Wt 80.7 kg (178 lb)   SpO2 95%   BMI 27.06 kg/m        Other appropriate exam:    There are no preventive care reminders to display for this patient.   ASSESSMENT & PLAN:  Problem List Items Addressed This Visit          Cardiovascular System    STEMI (ST elevation myocardial infarction) (CMS HCC) (Chronic)    Coronary artery disease involving native coronary artery of native heart without angina pectoris (Chronic)    History of placement of stent in LAD coronary artery (Chronic)    History of atrial fibrillation (Chronic)    Essential hypertension (Chronic)    PAF (paroxysmal atrial fibrillation) (CMS HCC)       Respiratory    OSA on CPAP (Chronic)     Other Visit Diagnoses       Encounter for Medicare annual wellness exam    -  Primary    Acute pain of left shoulder  (Chronic)       Relevant Orders    MRI SHOULDER LEFT WO CONTRAST             Identified Risk Factors/ Recommended Actions     Fall Risk Follow up plan of care: Discussed optimizing home safety  The PHQ 2 Total: 0 depression screen is interpreted as negative.        Patient declined Advanced Directives information.        Orders Placed This Encounter    MRI SHOULDER LEFT WO CONTRAST    methocarbamoL (ROBAXIN) 750 mg Oral Tablet          The patient has been educated about risk factors and recommended preventive care. Written Prevention Plan completed/ updated and given to patient (see After Visit Summary).    Return in about 2 weeks (around 11/08/2022).    Lucia Gaskins, DO  10/25/2022 10:38

## 2022-10-25 NOTE — Patient Instructions (Signed)
Medicare Preventive Services  Medicare coverage information Recommendation for YOU   Heart Disease and Diabetes   Lipid profile every 5 years or more often if at risk for cardiovascular disease  Lab Results   Component Value Date    CHOLESTEROL 107 07/20/2022    HDLCHOL 40 07/20/2022    LDLCHOL 45 07/20/2022    LDLCHOLDIR 12 08/24/2021    TRIG 110 07/20/2022       Diabetes Screening with Blood Glucose test or Glucose Tolerance Test Yearly for those at risk for diabetes, up to two tests per year for those with prediabetes  Last Glucose: 173     Diabetes Self-Management Training   Initial training ten hours per year, and follow-up training two hours per subsequent year. Optional for those with diabetes    Medical Nutrition Therapy   Three hours of one-on-one counseling in first year, two hours in subsequent years. Optional for those with diabetes, kidney disease   Intensive Behavioral Therapy for Obesity  Face-to-face counseling, first month every week, month 2-6 every other week, month 7-12 every month if continued progress is documented Optional for those with Body Mass Index 30 or higher  Your Body mass index is 27.06 kg/m.   Tobacco Cessation (Quitting) Counseling   Two attempts per year, max 4 sessions per attempt, up to 8 sessions per year Optional for those who use tobacco    Cancer Screening Last Completion Date   Colorectal screening   For anyone age 67 to 59 or any age if high risk:  Screening Colonoscopy every 10 yrs if low risk,  more frequent if higher risk  OR  Cologuard Stool DNA test once every 3 years OR  Fecal Occult Blood Testing yearly OR  Flexible  Sigmoidoscopy  every 5 yr OR  CT Colonography every 5 yrs    See below for due date if applicable.   Prostate Cancer Screening  Prostate Specific Antigen blood test based on joint decision making with your provider for ages 68-69  A joint decision between you and your primary care provider   Lung Cancer Screening  Annual low dose computed tomography  (LDCT scan) is recommended for those age 75-80 who smoked 20 pack-years and are current smokers or quit smoking within past 15 years, after counseling by your doctor or nurse clinician about the possible benefits or harms.   See below for due date if applicable.   Vaccinations   Respiratory syncytial virus (RSV)  Age 86 years or older: Based on shared clinical decision-making with your provider.  Pneumococcal Vaccine  Recommended routinely age 68+ with one or two separate vaccines based on your risk. Recommended before age 83 if medical conditions with increased risk  Seasonal Influenza Vaccine  Once every flu season   Hepatitis B Vaccine  3 doses if risk (including anyone with diabetes or liver disease)  Shingles Vaccine  Two doses at age 27 or older  Diphtheria Tetanus Pertussis Vaccine  ONCE as adult, booster every 10 years     Immunization History   Administered Date(s) Administered    Covid-19 Vaccine,Pfizer-BioNTech,Purple Top,64yrs+ 07/26/2019, 08/16/2019, 09/23/2020     Shingles vaccine and Diphtheria Tetanus Pertussis vaccines are available at pharmacies or local health department without a prescription.   Other Preventative Screening  Last Completion Date   Glaucoma Screening   Yearly if in high risk group such as diabetes, family history, African American age 31+ or Hispanic American age 68+ See your Eye Care Provider   Hepatitis  C Screening   Recommended  for those born between ages 18-79 years.   See below for due date if applicable.     HIV Testing  Recommended routinely at least ONCE, covered every year for age 25 to 41 regardless of risk, and every year for age over 50 who ask for the test or higher risk. Yearly or up to 3 times in pregnancy    See below for due date if applicable.  Bone Densitometry   Screening is recommended for Men ages 67 and above with one or more risk factor: androgen deprivation therapy for prostate cancer, hypogonadism, frailty, primary hyperparathyroidism,  hyperthyroidism  For men diagnosed with osteoporosis, follow up is recommended every years or a frequency recommended by your provider     See below for due date if applicable.   Abdominal Aortic Aneurysm Screening Ultrasound   Once with a family history of abdominal aortic aneurysms OR a male between 53-75 and have smoked at least 100 cigarettes in your lifetime.     See below for due date if applicable.       Your Personalized Schedule for Preventive Tests     Health Maintenance: Pending and Last Completed         Date Due Completion Date    Adult Tdap-Td (1 - Tdap) 04/26/2023 (Originally 02/29/1972) ---    Shingles Vaccine (1 of 2) 04/26/2023 (Originally 03/01/2003) ---    Covid-19 Vaccine (4 - 2023-24 season) 04/26/2023 (Originally 02/18/2022) 09/23/2020    Pneumococcal Vaccination, Age 35+ (1 of 2 - PCV) 04/26/2023 (Originally 03/01/1959) ---    Influenza Vaccine (Season Ended) 02/19/2023 ---    Colonoscopy 03/01/2023 ---    Depression Screening 10/25/2023 10/25/2022                For Information on Advanced Directives for Health Care:  [place]:  [email or web address]  [place], [place], [place], [place] General Information: [email or web address]

## 2022-11-07 ENCOUNTER — Encounter (INDEPENDENT_AMBULATORY_CARE_PROVIDER_SITE_OTHER): Payer: Self-pay | Admitting: Family

## 2022-11-07 ENCOUNTER — Other Ambulatory Visit: Payer: Self-pay

## 2022-11-07 ENCOUNTER — Ambulatory Visit (INDEPENDENT_AMBULATORY_CARE_PROVIDER_SITE_OTHER): Payer: Medicare PPO | Admitting: Family

## 2022-11-07 VITALS — BP 159/82 | HR 63 | Ht 68.0 in | Wt 178.0 lb

## 2022-11-07 DIAGNOSIS — M25512 Pain in left shoulder: Secondary | ICD-10-CM

## 2022-11-07 DIAGNOSIS — I10 Essential (primary) hypertension: Secondary | ICD-10-CM

## 2022-11-07 NOTE — Progress Notes (Signed)
[facility]NAL [facility], [facility]  [address]  [place] [address]       Name: Brandon Wells. MRN:  [id number]   Date: 11/07/2022 Age: 72 y.o.       Chief Complaint:    Chief Complaint   Patient presents with    Shoulder Injury     [place] Spaniel he has been doing some muscle relaxer and said it is some better.         HPI:  Brandon Wells. is a 72 y.o. male who presents with complaints of left shoulder pain.  Shoulder is some better with the muscle relaxers.  Insurance has denied his mri.          Past Medical History:  Past Medical History:   Diagnosis Date    Allergic rhinitis     Arthritis     Back problem     CPAP (continuous positive airway pressure) dependence     Esophageal reflux     Hypercholesterolemia     Hyperlipidemia     Hypertension     Myocardial infarction (CMS HCC)     Peripheral edema     Personal history of venous thrombosis and embolism     Renal calculi     Sleep apnea     Stented coronary artery     Wears glasses          Past Surgical History:   Procedure Laterality Date    ACHILLES TENDON REPAIR Left     HX CORONARY STENT PLACEMENT      HX HERNIA REPAIR Bilateral     inguinal    HX HIP REPLACEMENT Left 09/28/2022      Current Outpatient Medications   Medication Sig    acetaminophen (TYLENOL ARTHRITIS PAIN) 650 mg Oral Tablet Sustained Release Take 1 Tablet (650 mg total) by mouth Every 8 hours as needed for Pain    aspirin (ECOTRIN) 81 mg Oral Tablet, Delayed Release (E.C.) Take 1 Tablet (81 mg total) by mouth Twice daily X 6 weeks    atorvastatin (LIPITOR) 80 mg Oral Tablet Take 1 Tablet (80 mg total) by mouth Every night    cholecalciferol, vitamin D3, 25 mcg (1,000 unit) Oral Tablet Take 2 Tablets (2,000 Units total) by mouth Once a day    clopidogreL (PLAVIX) 75 mg Oral Tablet Take 1 Tablet (75 mg total) by mouth Once a day    cyanocobalamin (VITAMIN B 12) 1,000 mcg Oral Tablet Take 1 Tablet (1,000 mcg total) by mouth Once a day    furosemide (LASIX) 20 mg Oral  Tablet Take 1 Tablet (20 mg total) by mouth Once a day    lisinopriL (PRINIVIL) 5 mg Oral Tablet Take 1 Tablet (5 mg total) by mouth Once a day    methocarbamoL (ROBAXIN) 750 mg Oral Tablet Take 1 Tablet (750 mg total) by mouth Three times a day as needed    metoprolol succinate (TOPROL-XL) 25 mg Oral Tablet Sustained Release 24 hr Take 1 Tablet (25 mg total) by mouth Once a day    mometasone (NASONEX) 50 mcg/actuation Nasal Spray, Non-Aerosol ADMINISTER 2 SPRAYS INTO AFFECTED NOSTRIL(S) ONCE PER DAY AS NEEDED    pantoprazole (PROTONIX) 40 mg Oral Tablet, Delayed Release (E.C.) Take 1 Tablet (40 mg total) by mouth Once a day     Allergies   Allergen Reactions    Hydrocodone Rash    Prednisone  Other Adverse Reaction (Add comment)     Insomnia  Family History:  Family Medical History:       Problem Relation (Age of Onset)    Bone cancer Father    Esophageal cancer Mother    Heart Disease Brother    Lung Cancer Mother              Social History:   Social History     Tobacco Use   Smoking Status Never   Smokeless Tobacco Never     Social History     Substance and Sexual Activity   Alcohol Use Not Currently    Comment: occasionally     Social History     Occupational History    Not on file       Review of Systems:  Review of systems as discussed in HPI    Problem List:  Patient Active Problem List   Diagnosis    STEMI (ST elevation myocardial infarction) (CMS HCC)    Coronary artery disease involving native coronary artery of native heart without angina pectoris    Volume overload    Systolic and diastolic CHF, 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     Appearance: Normal appearance. He is normal weight. He is not diaphoretic.   HENT:      Head: Normocephalic.      Right Ear: Tympanic membrane normal.      Left Ear: Tympanic membrane normal.      Nose: Nose normal.      Mouth/Throat:      Mouth: Mucous membranes are moist.      Pharynx: Oropharynx is clear.   Eyes:      Pupils: Pupils are equal, round, and reactive to light.   Cardiovascular:      Rate and Rhythm: Normal rate and regular rhythm.   Pulmonary:      Breath sounds: Normal breath sounds.   Abdominal:      General: Bowel sounds are normal.      Palpations: Abdomen is soft.   Musculoskeletal:         General: Normal range of motion.      Cervical back: Normal range of motion and neck supple.   Skin:     General: Skin is warm and dry.   Neurological:      Mental Status: He is alert.   Psychiatric:         Mood and Affect: Mood normal.         Behavior: Behavior normal.         Thought Content: Thought content normal.         Judgment: Judgment normal.      Health Maintenance:  Health Maintenance   Topic Date Due  Adult Tdap-Td (1 - Tdap) 04/26/2023 (Originally 02/29/1972)    Shingles Vaccine (1 of 2) 04/26/2023 (Originally 03/01/2003)    Covid-19 Vaccine (4 - 2023-24 season) 04/26/2023 (Originally 02/18/2022)    Pneumococcal Vaccination, Age 15+ (1 of 2 - PCV) 04/26/2023 (Originally 03/01/1959)    Influenza Vaccine (Season Ended) 02/19/2023    Colonoscopy  03/01/2023    Depression Screening  10/25/2023    Medicare Annual Wellness Visit - Calendar Year Insurers  Completed    Meningococcal Vaccine  Aged Out    Hepatitis C screening  Discontinued        Assessment:    ICD-10-CM    1. Acute pain of left shoulder  M25.512       2. Essential hypertension  I10       3. Coronary artery disease involving native coronary artery of native heart without angina  pectoris  I25.10       4. OSA on CPAP  G47.33            Plan:  Will set him up for physical therapy to his left shoulder.  Repeat b/p 142/80.  Will continue his muscle relaxers prn.                       Follow up:  As schedule.      This note was partially created using voice recognition software and is inherently subject to errors including those of syntax and "sound alike " substitutions which may escape proof reading.  In such instances, original meaning may be extrapolated by contextual derivation.    [address] [place], FNP-BC  11/07/2022, 13:46

## 2022-11-08 ENCOUNTER — Encounter (INDEPENDENT_AMBULATORY_CARE_PROVIDER_SITE_OTHER): Payer: Self-pay | Admitting: Family

## 2022-11-16 ENCOUNTER — Encounter (HOSPITAL_COMMUNITY): Payer: Self-pay

## 2022-11-16 ENCOUNTER — Ambulatory Visit (HOSPITAL_COMMUNITY)
Admission: RE | Admit: 2022-11-16 | Discharge: 2022-11-16 | Disposition: A | Discharge status: Still In Hospital | Payer: Medicare PPO | Source: Ambulatory Visit

## 2022-11-16 DIAGNOSIS — M25512 Pain in left shoulder: Secondary | ICD-10-CM

## 2022-11-16 NOTE — PT Evaluation (Signed)
[facility]  Outpatient Physical Therapy  [address]  [place], [address]  [phone]  (Fax) [phone]       Physical Therapy Upper Extremity Evaluation    Date: 11/16/2022  Patient's Name: Brandon Wells.  Date of Birth: 1950-12-22  Physical Therapy Evaluation      PT diagnosis/Reason for Referral: Acute pain of L shoulder           SUBJECTIVE  Date of onset: 09/24/2022    Mechanism of injury: Fall    Previous episodes/treatments: No prior treatment for this issue    PLOF: 1 prior fall onto that same shoulder with similar injury, but with a faster recovery no lasting impact beyond 6-8 weeks from the first fall. Still did not have full ROM of the L shoulder following the first injury.    Medications for this problem: On file    Diagnostic tests: XR of the L shoulder taken at [facility] per patient report.    Patient goals: REDUCE PAIN and NORMALIZE FUNCTION    Occupation: Retired    Next MD visit: Follow up to be scheduled following PT completion.    Pain location: Anterior L shoulder                    Pain description: SHARP    Pain frequency:  INTERMITTENT    Pain rating: Now 0   Best 0   Worst 7    Radiculopathy: Denies    Pain increases with: ADLs, ACTIVITY, EXERCISE, and LIFTING           decreases with : POSITION CHANGE and REST    Sensation: Denies    Weakness: Present in the L shoulder    Sleep affected: Only affected while sleeping on R side.    Subjective Functional Reports:    Sitting: WFL    Standing: WFL    Walking: WFL    Lifting: LIMITED      Patient-Specific Functional Score:    Problem Score   1. Donning /doffing shirts 4   Total 4     OBJECTIVE    Shoulder AROM   right left   Flexion 154 132   Extension 73 69   Abduction 158 83   ER 64 42   IR 35 9     Elbow AROM: Grossly equal for both    Strength (defined as __/5 based on 0/5 - 5/5 grading system)     right left   Shoulder flexion 5 4+   Shoulder abduction  5 4(pain)   Shoulder IR 5 5   Shoulder  ER 4- 3+   Elbow flexion 5 4+   Elbow extension 5 5   Wrist extension  5 5   Wrist flexion  5 5     Palpation: Popeye Sign on the L(from prior injury)    Special tests:   Shoulder special tests:    KENNEDY HAWKINS TEST: NEGATIVE LEFT    SPEEDS TEST: POSITIVE LEFT    Painful Arc: Pos L    Full/Empty Can: Pos L       Treatment provided:REVIEW OF POC AND GOALS WITH PATIENT, ALL QUESTIONS ANSWERED and PATIENT EDUCATION  Access Code: [email or web address]  URL: [email or web address]  Date: 11/16/2022  Prepared by: Joselyn Arrow    Exercises  - Seated Shoulder Flexion AAROM with Pulley Behind  - 2-3 x daily - 7 x weekly - 15 reps - 2s hold  - Seated  Shoulder Scaption AAROM with Pulley at Side  - 2-3 x daily - 7 x weekly - 15 reps - 2s hold  - Standing Isometric Shoulder External Rotation with Doorway  - 1-2 x daily - 7 x weekly - 2 sets - 6 reps - 5s hold  - Standing Isometric Shoulder Abduction with Doorway - Arm Bent  - 1-2 x daily - 7 x weekly - 2 sets - 6 reps - 5s hold  - Standing Bilateral Low Shoulder Row with Anchored Resistance  - 1-2 x daily - 7 x weekly - 3 sets - 12 reps  - Seated Shoulder Shrug Circles AROM Backward  - 3-4 x daily - 7 x weekly - 12 reps        ASSESSMENT    Impression: Patient presents with L shoulder pain following a fall onto that same shoulder which does have a prior history of multiple injuries. 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 Based on clinical testing, there is likely at least a partial tear of supraspinatus and a partial tear of infraspinatus and/or teres minor on the L side. Patient may benefit from skilled physical therapy services in order to address deficits in L shoulder strength, L shoulder overhead function, and L shoulder pain for improved completion of ADLs and community involvement.     Rehab potential: FAIR and POOR      Short term goals (3 weeks):  -Patient  will achieve a L shoulder abduction AROM of at least 110 degrees.  -Patient will achieve a L shoulder flexion AROM of at least 142 degrees.  -Patient will achieve a L shoulder IR AROM of at least 20 degrees.   -Patient will achieve a L shoulder ER AROM of at least 50 degrees.   -Patient will achieve a L shoulder ER MMT of at least 4-.  -Patient will report a peak pain level in the L shoulder of no greater than 5/10.  -Patient will achieve a PSFS rating of at least 6.    Long term goals (6 weeks):  -Patient will achieve a L shoulder abduction AROM of at least 130 degrees.  -Patient will achieve a L shoulder flexion AROM of at least 150 degrees.  -Patient will achieve a L shoulder IR AROM of at least 30 degrees.   -Patient will achieve a L shoulder ER AROM of at least 58 degrees.   -Patient will achieve a L shoulder ER MMT of at least 4.  -Patient will report a peak pain level in the L shoulder of no greater than 3/10.  -Patient will achieve a PSFS rating of at least 8.  -Patient will be independent with his HEP.            PLAN  Patient will attend 2 times per week x 6 weeks. Therapy may include, but is not limited to THERAPEUTIC EXERCISES, MYOFASCIAL/JOINT MOBILIZATION, POSTURE/BODY MECHANICS, ERGONOMIC TRAINING, HOME INSTRUCTIONS, HEAT/COLD, and KINESIOTAPE    Plan for next visit: Continue with isometrics for L shoulder abduction and ER movements and standard TE for all other shoulder musculature per patient tolerance.        Evaluation complexity:   Personal factors impacting POC: FREQUENT OR CHRONIC PAIN and PRE-EXISTING FUNCTIONAL LIMITATIONS   Co-morbidities impacting POC:  None  Complexity of physical exam: INCLUDING MUSCULOSKELETAL SYSTEM (POSTURE, ROM, STRENGTH, HEIGHT/WEIGHT) and REFERRAL IS FOR A CHRONIC PROBLEM  Clinical Presentation: STABLE   Evaluation Complexity: LOW-HISTORY 0, EXAMINATION 1-2, STABLE PRESENTATION        Total Session Time 32        Intervention minutes: EVALUATION 31 minutes and  THERAPEUTIC EXERCISE 1 minutes    Creig Hines, PT  11/16/2022, 08:15    Start of Service: _________          Certification:    From:______  Through:_________    I certify the need for these services furnished under this plan of treatment and while under my care.    Referring Provider Signature: _______________     Date : _____________________    Printed Name of Referring Provider: __________________________________________

## 2022-11-18 ENCOUNTER — Ambulatory Visit (HOSPITAL_COMMUNITY)
Admission: RE | Admit: 2022-11-18 | Discharge: 2022-11-18 | Disposition: A | Discharge status: Still In Hospital | Payer: Medicare PPO | Source: Ambulatory Visit

## 2022-11-18 ENCOUNTER — Other Ambulatory Visit: Payer: Self-pay

## 2022-11-18 ENCOUNTER — Other Ambulatory Visit (INDEPENDENT_AMBULATORY_CARE_PROVIDER_SITE_OTHER): Payer: Self-pay | Admitting: Internal Medicine

## 2022-11-18 DIAGNOSIS — M25512 Pain in left shoulder: Secondary | ICD-10-CM

## 2022-11-18 MED ORDER — METHOCARBAMOL 750 MG TABLET
750.0000 mg | ORAL_TABLET | Freq: Three times a day (TID) | ORAL | 1 refills | Status: DC | PRN
Start: 2022-11-18 — End: 2023-01-19

## 2022-11-18 NOTE — PT Treatment (Signed)
[facility]  Outpatient Physical Therapy  [address]  [place], [address]  [phone]  (Fax) [phone]    Physical Therapy Treatment Note    Date: 11/18/2022  Patient's Name: Brandon Wells.  Date of Birth: March 19, 1951  Physical Therapy Visit            Visit #/POC: 2 of up to 12 planned  Authorization:   POC Signed?: yes  POC Ends: 01/04/23  Next Progress Note Due: by 10th visit or before 12/17/22      Evaluating Physical Therapist: Creig Hines  PT diagnosis/Reason for Referral: Acute pain of left shoulder   Next Scheduled Physician Appointment: August 2024  Allergies/Contraindications: none stated          Subjective: Patient relates shoulder feels "tight" this morning. He feels shoulder is improving but notes "it was improving before I started". He has been working with HEP.    Objective: Treatment delivered as outlined below:    Measured ROM: NT today  EXERCISE/ACTIVITY NAME REPETITIONS RESISTANCE COMPLETED THIS DOS   PROM left shoulder         Yes   Pec stretch      manual Yes    Supine IR/ER isometric against manual resistance   10 rep x 5 sec each   Yes    Sidelying ER   10 rep   Yes    Sidelying abduction    10 rep   Yes    UBE   4 minutes   Yes    Standing retraction w/ band   10 rep Green  Yes    Standing low retraction w/ band   10 rep  Green  Yes                Assessment: Tolerated progression of therex well with no reported increase in pain. Needed cues for proper form with scapular retraction w/ band.     Short term goals (3 weeks):  -Patient will achieve a L shoulder abduction AROM of at least 110 degrees.  -Patient will achieve a L shoulder flexion AROM of at least 142 degrees.  -Patient will achieve a L shoulder IR AROM of at least 20 degrees.   -Patient will achieve a L shoulder ER AROM of at least 50 degrees.   -Patient will achieve a L shoulder ER MMT of at least 4-.  -Patient will report a peak pain level in the L shoulder of no greater than  5/10.  -Patient will achieve a PSFS rating of at least 6.     Long term goals (6 weeks):  -Patient will achieve a L shoulder abduction AROM of at least 130 degrees.  -Patient will achieve a L shoulder flexion AROM of at least 150 degrees.  -Patient will achieve a L shoulder IR AROM of at least 30 degrees.   -Patient will achieve a L shoulder ER AROM of at least 58 degrees.   -Patient will achieve a L shoulder ER MMT of at least 4.  -Patient will report a peak pain level in the L shoulder of no greater than 3/10.  -Patient will achieve a PSFS rating of at least 8.  -Patient will be independent with his HEP.    Plan: Reassess tolerance to today's session and progress as able.     Total Session Time 40 and Timed code minutes 40  THERAPEUTIC EXERCISE 40 minutes      [employer], PTA  11/18/2022, 08:49

## 2022-11-22 ENCOUNTER — Ambulatory Visit (HOSPITAL_COMMUNITY)
Admission: RE | Admit: 2022-11-22 | Discharge: 2022-11-22 | Disposition: A | Discharge status: Still In Hospital | Payer: Medicare PPO | Source: Ambulatory Visit

## 2022-11-22 ENCOUNTER — Encounter (INDEPENDENT_AMBULATORY_CARE_PROVIDER_SITE_OTHER): Payer: Self-pay | Admitting: Family

## 2022-11-22 DIAGNOSIS — M25512 Pain in left shoulder: Secondary | ICD-10-CM

## 2022-11-22 NOTE — PT Treatment (Signed)
[facility] Medicine [facility]  Outpatient Physical Therapy  [address]  [place], [address]  [phone]  (Fax) [phone]    Physical Therapy Treatment Note    Date: 11/22/2022  Patient's Name: Brandon Wells.  Date of Birth: 03-17-1951  Physical Therapy Visit    Visit #/POC: 3 of up to 12 planned  Authorization:   POC Signed?: yes  POC Ends: 01/04/23  Next Progress Note Due: by 10th visit or before 12/17/22     Evaluating Physical Therapist: Creig Hines  PT diagnosis/Reason for Referral: Acute pain of left shoulder   Next Scheduled Physician Appointment: August 2024  Allergies/Contraindications: none stated     Subjective: Patient reports that he is continuing to improve and only a certain range will cause discomfort. He notices increase ease donning/doffing shirts at home.     Objective: Treatment delivered as outlined below:     Measured ROM: NT today  EXERCISE/ACTIVITY NAME REPETITIONS RESISTANCE COMPLETED THIS DOS   PROM left shoulder          No   Pec stretch       manual No   Supine IR/ER isometric against manual resistance    10 rep x 5 sec each   No    Sidelying ER with end ROM assist    3x8   Yes   Sidelying abduction     10 rep   No    UBE    4 minutes   No    Standing retraction w/ band    10 rep Green  No    Standing low retraction w/ band    10 rep  Green  No     Pulley Shoulder Flexion AAROM x83min   Yes   Finger Ladder Abduction 1x6, 1x6 NW, 1lbs Yes   Seated Chicken Wing 1x12, 1x12, 1x12 NW,  1lbs, 2lbs Yes         Assessment: Patient tolerated treatment well during today's session and exercise progressions specific to the L supraspinatus and infraspinatus/teres minor were added. Incomplete ER of the L shoulder was noted during the sidelying exercise so end ROM assistance was added in order to begin re-training the lost motion eccentrically. No additions to HEP made during this session and the patient was instructed to monitor his response at home in order to give a  report on level of tolerance.     Short term goals (3 weeks):  -Patient will achieve a L shoulder abduction AROM of at least 110 degrees.  -Patient will achieve a L shoulder flexion AROM of at least 142 degrees.  -Patient will achieve a L shoulder IR AROM of at least 20 degrees.   -Patient will achieve a L shoulder ER AROM of at least 50 degrees.   -Patient will achieve a L shoulder ER MMT of at least 4-.  -Patient will report a peak pain level in the L shoulder of no greater than 5/10.  -Patient will achieve a PSFS rating of at least 6.     Long term goals (6 weeks):  -Patient will achieve a L shoulder abduction AROM of at least 130 degrees.  -Patient will achieve a L shoulder flexion AROM of at least 150 degrees.  -Patient will achieve a L shoulder IR AROM of at least 30 degrees.   -Patient will achieve a L shoulder ER AROM of at least 58 degrees.   -Patient will achieve a L shoulder ER MMT of at least 4.  -  Patient will report a peak pain level in the L shoulder of no greater than 3/10.  -Patient will achieve a PSFS rating of at least 8.  -Patient will be independent with his HEP.     Plan: Reassess tolerance to today's session and progress as able.     Total Session Time 39  THERAPEUTIC EXERCISE 39 minutes      Creig Hines, PT  11/22/2022, 14:01

## 2022-11-23 ENCOUNTER — Ambulatory Visit (HOSPITAL_COMMUNITY): Payer: Self-pay

## 2022-11-24 ENCOUNTER — Ambulatory Visit
Admission: RE | Admit: 2022-11-24 | Discharge: 2022-11-24 | Disposition: A | Discharge status: Still In Hospital | Payer: Medicare PPO | Source: Ambulatory Visit

## 2022-11-24 DIAGNOSIS — M25512 Pain in left shoulder: Secondary | ICD-10-CM

## 2022-11-24 NOTE — PT Treatment (Signed)
[facility] Medicine [facility]  Outpatient Physical Therapy  [address]  [place], [address]  [phone]  (Fax) [phone]    Physical Therapy Treatment Note    Date: 11/24/2022  Patient's Name: Brandon Wells.  Date of Birth: [birth year]  Physical Therapy Visit    Visit #/POC: 4 of up to 12 planned  Authorization:   POC Signed?: yes  POC Ends: 01/04/23  Next Progress Note Due: by 10th visit or before 12/17/22     Evaluating Physical Therapist: Creig Hines  PT diagnosis/Reason for Referral: Acute pain of left shoulder   Next Scheduled Physician Appointment: August 2024  Allergies/Contraindications: none stated     Subjective: Patient reports his peak pain level was a 6/10 and this occurred while putting his shirt on.      Objective: Treatment delivered as outlined below:     Measured ROM: NT today  EXERCISE/ACTIVITY NAME REPETITIONS RESISTANCE COMPLETED THIS DOS   PROM left shoulder          No   Pec stretch       manual No   Supine IR/ER isometric against manual resistance    10 rep x 5 sec each   No    Sidelying ER with end ROM assist    3x8   No   Sidelying abduction     10 rep   No    UBE    X86min each   Yes    Standing retraction w/ band    10 rep Green  No    Standing low retraction w/ band    10 rep  Green  No     Pulley Shoulder Flexion AAROM   Pulley Shoulder Abduction AAROM 2x8  2x8 50%  75% Yes  Yes   Finger Ladder Abduction 1x6, 1x6 NW, 1lbs No   Seated Chicken Wing 1x12, 2x10 NW, 2lbs Yes   Supine Shoulder ER/IR AROM w/ bolster 3x8 NW Yes      Assessment: Patient tolerated today's treatment session well and had no significant increase in discomfort level. New ER/IR based exercise was well performed anda moderate degree of verbal and tactile cuing was needed in order to perform with proper form.     Short term goals (3 weeks):  -Patient will achieve a L shoulder abduction AROM of at least 110 degrees.  -Patient will achieve a L shoulder flexion AROM of at least 142  degrees.  -Patient will achieve a L shoulder IR AROM of at least 20 degrees.   -Patient will achieve a L shoulder ER AROM of at least 50 degrees.   -Patient will achieve a L shoulder ER MMT of at least 4-.  -Patient will report a peak pain level in the L shoulder of no greater than 5/10.  -Patient will achieve a PSFS rating of at least 6.     Long term goals (6 weeks):  -Patient will achieve a L shoulder abduction AROM of at least 130 degrees.  -Patient will achieve a L shoulder flexion AROM of at least 150 degrees.  -Patient will achieve a L shoulder IR AROM of at least 30 degrees.   -Patient will achieve a L shoulder ER AROM of at least 58 degrees.   -Patient will achieve a L shoulder ER MMT of at least 4.  -Patient will report a peak pain level in the L shoulder of no greater than 3/10.  -Patient will achieve a PSFS rating of at least 8.  -  Patient will be independent with his HEP.     Plan: Continue to progress strengthening exercises per patient tolerance and based on caution with L abduction and ER movements.    Total Session Time 43  THERAPEUTIC EXERCISE 43 minutes      Creig Hines, PT  11/24/2022, 14:01

## 2022-11-29 ENCOUNTER — Ambulatory Visit (HOSPITAL_COMMUNITY): Payer: Self-pay

## 2022-12-01 ENCOUNTER — Ambulatory Visit (HOSPITAL_COMMUNITY)
Admission: RE | Admit: 2022-12-01 | Discharge: 2022-12-01 | Disposition: A | Discharge status: Still In Hospital | Payer: Medicare PPO | Source: Ambulatory Visit

## 2022-12-01 DIAGNOSIS — M25512 Pain in left shoulder: Secondary | ICD-10-CM

## 2022-12-01 NOTE — PT Treatment (Signed)
[facility] Medicine [facility]  Outpatient Physical Therapy  [address]  [place], [address]  [phone]  (Fax) [phone]    Physical Therapy Treatment Note    Date: 12/01/2022  Patient's Name: Brandon Wells.  Date of Birth: 1951-05-18  Physical Therapy Visit    Visit #/POC: 5 of up to 12 planned  Authorization:   POC Signed?: yes  POC Ends: 01/04/23  Next Progress Note Due: by 10th visit or before 12/17/22     Evaluating Physical Therapist: Creig Hines  PT diagnosis/Reason for Referral: Acute pain of left shoulder   Next Scheduled Physician Appointment: August 2024  Allergies/Contraindications: none stated     Subjective: Patient reports his peak pain level was a 2/10. 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     Objective:     Measured ROM:  Shoulder AROM    right left   Flexion 154 142   Extension 73 64   Abduction 158 137   ER 64 64   IR 35 74        EXERCISE/ACTIVITY NAME REPETITIONS RESISTANCE COMPLETED THIS DOS   PROM left shoulder          No   Pec stretch       manual No   Supine IR/ER isometric against manual resistance    10 rep x 5 sec each   No    Sidelying ER with end ROM assist    3x8   No   Sidelying abduction     10 rep   No    UBE    X9min each   Yes    Standing retraction w/ band    10 rep Green  No    Standing low retraction w/ band    10 rep  Green  No     Pulley Shoulder Flexion AAROM   Pulley Shoulder Abduction AAROM 2x12  2x12 50%  75% Yes  Yes    Finger Ladder Abduction 1x6, 1x6 NW, 1lbs No   Seated Chicken Wing 1x12, 2x10 NW, 2lbs No    Supine Shoulder ER/IR AROM w/ bolster 1x10, 3x8 NW, 1lbs Yes       Assessment: Patient tolerated today's treatment session well and had no significant increase in discomfort level. He demonstrated a significant improvement in almost all motions for AROM of the L shoulder during this session.     Short term goals (3 weeks):  -Patient will  achieve a L shoulder abduction AROM of at least 110 degrees.  -Patient will achieve a L shoulder flexion AROM of at least 142 degrees.  -Patient will achieve a L shoulder IR AROM of at least 20 degrees.   -Patient will achieve a L shoulder ER AROM of at least 50 degrees.   -Patient will achieve a L shoulder ER MMT of at least 4-.  -Patient will report a peak pain level in the L shoulder of no greater than 5/10.  -Patient will achieve a PSFS rating of at least 6.     Long term goals (6 weeks):  -Patient will achieve a L shoulder abduction AROM of at least 130 degrees.  -Patient will achieve a L shoulder flexion AROM of at least 150 degrees.  -Patient will achieve a L shoulder IR AROM of at least 30 degrees.   -Patient will achieve a L shoulder ER AROM of at least  58 degrees.   -Patient will achieve a L shoulder ER MMT of at least 4.  -Patient will report a peak pain level in the L shoulder of no greater than 3/10.  -Patient will achieve a PSFS rating of at least 8.  -Patient will be independent with his HEP.     Plan: Continue to progress strengthening exercises per patient tolerance and based on caution with L abduction and ER movements.    Total Session Time 43  THERAPEUTIC EXERCISE 43 minutes      Creig Hines, PT  12/01/2022, 13:56

## 2022-12-06 ENCOUNTER — Ambulatory Visit (HOSPITAL_COMMUNITY)
Admission: RE | Admit: 2022-12-06 | Discharge: 2022-12-06 | Disposition: A | Discharge status: Still In Hospital | Payer: Medicare PPO | Source: Ambulatory Visit

## 2022-12-06 DIAGNOSIS — M25512 Pain in left shoulder: Secondary | ICD-10-CM

## 2022-12-06 NOTE — PT Treatment (Signed)
[facility] Medicine [facility]  Outpatient Physical Therapy  [address]  [place], [address]  [phone]  (Fax) [phone]    Physical Therapy Treatment Note    Date: 12/06/2022  Patient's Name: Brandon Wells.  Date of Birth: Aug 16, 1950  Physical Therapy Visit    Visit #/POC: 6 of up to 12 planned  Authorization:   POC Signed?: yes  POC Ends: 01/04/23  Next Progress Note Due: by 10th visit or before 12/17/22     Evaluating Physical Therapist: Creig Hines  PT diagnosis/Reason for Referral: Acute pain of left shoulder   Next Scheduled Physician Appointment: August 2024  Allergies/Contraindications: none stated     Subjective: Patient reports his resting pain level is a 0/10. He is still having an issue with overhead motions, but even with those motions specifically he is seeing an improvement.     Objective: Incomplete L shoulder ER AROM relative to L side.    Measurements: No measurements taken during this session  EXERCISE/ACTIVITY NAME REPETITIONS RESISTANCE COMPLETED THIS DOS   PROM left shoulder          No   Pec stretch       manual No   Supine IR/ER isometric against manual resistance    10 rep x 5 sec each   No    Sidelying ER with end ROM assist    3x8   Yes   Sidelying abduction     10 rep   No    UBE    X2min each   Yes    Standing retraction w/ band    10 rep Green  No    Standing low retraction w/ band    10 rep  Green  No     Pulley Shoulder Flexion AAROM   Pulley Shoulder Abduction AAROM 2x12  2x12 50%  75% No   No    Finger Ladder Abduction 1x8, 1x8 NW, 1lbs Yes   Seated Chicken Wing 1x12, 2x10 NW, 2lbs No    Supine Shoulder ER/IR AROM w/ bolster 1x10, 3x8 NW, 1lbs No    Seated Row 4x10 #30 Yes      Assessment: Patient tolerated today's treatment session well and had no significant increase in discomfort level. Ease with overhead motion continues to improve as does tolerance of resistance.     Short term goals (3 weeks):  -Patient will achieve a L shoulder abduction AROM  of at least 110 degrees.  -Patient will achieve a L shoulder flexion AROM of at least 142 degrees.  -Patient will achieve a L shoulder IR AROM of at least 20 degrees.   -Patient will achieve a L shoulder ER AROM of at least 50 degrees.   -Patient will achieve a L shoulder ER MMT of at least 4-.  -Patient will report a peak pain level in the L shoulder of no greater than 5/10.  -Patient will achieve a PSFS rating of at least 6.     Long term goals (6 weeks):  -Patient will achieve a L shoulder abduction AROM of at least 130 degrees.  -Patient will achieve a L shoulder flexion AROM of at least 150 degrees.  -Patient will achieve a L shoulder IR AROM of at least 30 degrees.   -Patient will achieve a L shoulder ER AROM of at least 58 degrees.   -Patient will achieve a L shoulder ER MMT of at least 4.  -Patient will report a peak pain level in the L  shoulder of no greater than 3/10.  -Patient will achieve a PSFS rating of at least 8.  -Patient will be independent with his HEP.     Plan: Continue to progress strengthening exercises per patient tolerance and based on caution with L abduction and ER movements.    Total Session Time 37  THERAPEUTIC EXERCISE 37 minutes      Creig Hines, PT  12/06/2022, 12:00

## 2022-12-08 ENCOUNTER — Ambulatory Visit (HOSPITAL_COMMUNITY)
Admission: RE | Admit: 2022-12-08 | Discharge: 2022-12-08 | Disposition: A | Discharge status: Still In Hospital | Payer: Medicare PPO | Source: Ambulatory Visit

## 2022-12-08 DIAGNOSIS — M25512 Pain in left shoulder: Secondary | ICD-10-CM

## 2022-12-08 NOTE — PT Treatment (Signed)
[facility] Medicine [facility]  Outpatient Physical Therapy  [address]  [place], [address]  [phone]  (Fax) [phone]    Physical Therapy Treatment Note    Date: 12/08/2022  Patient's Name: Brandon Wells.  Date of Birth: 1950/08/08  Physical Therapy Visit        Visit #/POC: 7 of up to 12 planned  Authorization:   POC Signed?: yes  POC Ends: 01/04/23  Next Progress Note Due: by 10th visit or before 12/17/22     Evaluating Physical Therapist: Creig Hines  PT diagnosis/Reason for Referral: Acute pain of left shoulder   Next Scheduled Physician Appointment: August 2024  Allergies/Contraindications: none stated     Subjective: Patient reports his resting pain level is a 0/10 and pain rating with activity is 2-3/10. States usually the only movement that causes pain is over head. Patient states that he does have some neck pain after hurting his shoulder(pointing to upper traps). Patient states his insurance denied him twice for a MRI and states they required him to come to PT before approval.     Objective: Incomplete L shoulder ER AROM relative to L side.     Measurements: No measurements taken during this session  EXERCISE/ACTIVITY NAME REPETITIONS RESISTANCE COMPLETED THIS DOS   PROM left shoulder          No   Pec stretch       manual No   Supine IR/ER isometric against manual resistance    10 rep x 5 sec each   No    Sidelying ER with end ROM assist    2x10  1x8    Yes   Sidelying abduction     10 rep   No    UBE    X6min each   Yes    Standing retraction w/ band    10 rep Green  No    Standing low retraction w/ band    10 rep  Green  No     Pulley Shoulder Flexion AAROM   Pulley Shoulder Abduction AAROM 2x12  2x12 50%  75% No   No    Finger Ladder Abduction 1x8, 1x8 NW, 1lbs Yes   Seated Chicken Wing 1x12, 2x10 NW, 2lbs No    Supine Shoulder ER/IR AROM w/ bolster 1x10, 3x8 NW, 1lbs No    Seated Row 4x10 #30 Yes   Seated multiangular ER/IR  YTB with IR  No resistance ER yes       Assessment: Initiated seated multiangular ER/IR with no increase in pain. Noted ER weakness. No adverse effects noted from treatment.      Short term goals (3 weeks):  -Patient will achieve a L shoulder abduction AROM of at least 110 degrees.  -Patient will achieve a L shoulder flexion AROM of at least 142 degrees.  -Patient will achieve a L shoulder IR AROM of at least 20 degrees.   -Patient will achieve a L shoulder ER AROM of at least 50 degrees.   -Patient will achieve a L shoulder ER MMT of at least 4-.  -Patient will report a peak pain level in the L shoulder of no greater than 5/10.  -Patient will achieve a PSFS rating of at least 6.     Long term goals (6 weeks):  -Patient will achieve a L shoulder abduction AROM of at least 130 degrees.  -Patient will achieve a L shoulder flexion AROM of at least 150 degrees.  -Patient will achieve  a L shoulder IR AROM of at least 30 degrees.   -Patient will achieve a L shoulder ER AROM of at least 58 degrees.   -Patient will achieve a L shoulder ER MMT of at least 4.  -Patient will report a peak pain level in the L shoulder of no greater than 3/10.  -Patient will achieve a PSFS rating of at least 8.  -Patient will be independent with his HEP.     Plan: Continue to progress strengthening exercises per patient tolerance and based on caution with L abduction and ER movements.         Total Session Time 32 and Timed code minutes 32  THERAPEUTIC EXERCISE 32 minutes      Trilby Leaver, PTA  12/08/2022, 13:48

## 2022-12-13 ENCOUNTER — Ambulatory Visit (HOSPITAL_COMMUNITY)
Admission: RE | Admit: 2022-12-13 | Discharge: 2022-12-13 | Disposition: A | Discharge status: Still In Hospital | Payer: Medicare PPO | Source: Ambulatory Visit

## 2022-12-13 DIAGNOSIS — M25512 Pain in left shoulder: Secondary | ICD-10-CM

## 2022-12-13 NOTE — PT Treatment (Signed)
[facility] Medicine [facility]  Outpatient Physical Therapy  [address]  [place], [address]  [phone]  (Fax) [phone]    Physical Therapy Treatment Note    Date: 12/13/2022  Patient's Name: Brandon Wells.  Date of Birth: 04-13-1951  Physical Therapy Visit    Visit #/POC: 8 of up to 12 planned  Authorization:   POC Signed?: yes  POC Ends: 01/04/23  Next Progress Note Due: by 10th visit or before 12/17/22     Evaluating Physical Therapist: Creig Hines  PT diagnosis/Reason for Referral: Acute pain of left shoulder   Next Scheduled Physician Appointment: August 2024  Allergies/Contraindications: none stated     Subjective: Patient reports his starting pain level in the L shoulder to be a 0/10 and with activity during this session a 3/10. He continues to note small improvements in his capabilities at home.     Objective: Incomplete L shoulder ER AROM relative to L side.     Measurements: No measurements taken during this session  EXERCISE/ACTIVITY NAME REPETITIONS RESISTANCE COMPLETED THIS DOS   PROM left shoulder          No   Pec stretch       manual No   Supine IR/ER isometric against manual resistance    10 rep x 5 sec each   No    Sidelying ER with end ROM assist    2x10  1x8    No    Sidelying abduction on slant table    3x10  40 degrees (2lbs-NW) Yes    UBE    X78min each   Yes    Standing retraction w/ band    10 rep Green  No    Standing low retraction w/ band    10 rep  Green  No     Pulley Shoulder Flexion AAROM   Pulley Shoulder Abduction AAROM 2x12  2x12 50%  75% No   No    Finger Ladder Abduction  Finger Ladder Flexion 1x6, 1x8  2x8 NW, 1lbs  1lbs Yes  Yes   Seated Chicken Wing 1x12, 2x10 NW, 2lbs No    Supine Shoulder ER/IR AROM w/ bolster 1x10, 3x8 NW, 1lbs No    Seated Row 4x10 #30 No    Seated multiangular ER/IR   YTB with IR  No resistance ER No   Supine Dual Shoulder Flex w/ Cane on Slant table 2x12, 1x12 45 degree and 40 degree  2lbs AW on L Yes      Assessment:  Patient tolerated treatment well during today's session, but progression of resistance was enough to cause fatigue of the L shoulder to the point that complete AROM for abduction was not available in partial sidelying.      Short term goals (3 weeks):  -Patient will achieve a L shoulder abduction AROM of at least 110 degrees.  -Patient will achieve a L shoulder flexion AROM of at least 142 degrees.  -Patient will achieve a L shoulder IR AROM of at least 20 degrees.   -Patient will achieve a L shoulder ER AROM of at least 50 degrees.   -Patient will achieve a L shoulder ER MMT of at least 4-.  -Patient will report a peak pain level in the L shoulder of no greater than 5/10.  -Patient will achieve a PSFS rating of at least 6.     Long term goals (6 weeks):  -Patient will achieve a L shoulder abduction AROM of at  least 130 degrees.  -Patient will achieve a L shoulder flexion AROM of at least 150 degrees.  -Patient will achieve a L shoulder IR AROM of at least 30 degrees.   -Patient will achieve a L shoulder ER AROM of at least 58 degrees.   -Patient will achieve a L shoulder ER MMT of at least 4.  -Patient will report a peak pain level in the L shoulder of no greater than 3/10.  -Patient will achieve a PSFS rating of at least 8.  -Patient will be independent with his HEP.     Plan: Perform progress note during the next session.    Total Session Time 41  THERAPEUTIC EXERCISE 41 minutes      Creig Hines, PT  12/13/2022, 14:08

## 2022-12-15 ENCOUNTER — Ambulatory Visit
Admission: RE | Admit: 2022-12-15 | Discharge: 2022-12-15 | Disposition: A | Discharge status: Still In Hospital | Payer: Medicare PPO | Source: Ambulatory Visit | Attending: Family | Admitting: Family

## 2022-12-15 DIAGNOSIS — M25512 Pain in left shoulder: Secondary | ICD-10-CM

## 2022-12-15 NOTE — PT Treatment (Signed)
[facility]  Outpatient Physical Therapy  [address]  [place], [address]  [phone]  (Fax) [phone]    Physical Therapy Discharge Note    Date: 12/15/2022  Patient's Name: Brandon Wells.  Date of Birth: May 15, 1951  Physical Therapy Progress Note     Visit #/POC: 9 of up to 12 planned  Authorization:   POC Signed?: yes  POC Ends: 01/04/23  Next Progress Note Due: by 10th visit or before 12/17/22     Evaluating Physical Therapist: Creig Hines  PT diagnosis/Reason for Referral: Acute pain of left shoulder   Next Scheduled Physician Appointment: August 2024  Allergies/Contraindications: none stated     Subjective: Patient reports that he is comfortable with discharge being today based on his level of function. He does have a previous injury to the R shoulder, for which he never had an MRI, to which he is now equating the L shoulder.      Patient-Specific Functional Score:     Problem Score   1. Donning /doffing shirts 9   Total 9      OBJECTIVE     Shoulder AROM    right left   Flexion 154 151   Extension 73 70   Abduction 158 142   ER 64 56   IR 81 88      Elbow AROM: Grossly equal for both     Strength (defined as __/5 based on 0/5 - 5/5 grading system)       right left   Shoulder flexion 5 5(tension)   Shoulder abduction  5 5(slight discomfort)   Shoulder IR 5 5   Shoulder ER 4- 4(slight discomfort)   Elbow flexion 5 4+   Elbow extension 5 5   Wrist extension  5 5   Wrist flexion  5 5      EXERCISE/ACTIVITY NAME REPETITIONS RESISTANCE COMPLETED THIS DOS   PROM left shoulder          No   Pec stretch       manual No   Supine IR/ER isometric against manual resistance    10 rep x 5 sec each   No    Sidelying ER with end ROM assist    2x10  1x8    No    Sidelying abduction on slant table    3x10  40 degrees (2lbs-NW) No    UBE    X61min each   Yes    Standing retraction w/ band    10 rep Green  No    Standing low retraction w/ band    10 rep  Green  No     Pulley  Shoulder Flexion AAROM   Pulley Shoulder Abduction AAROM 2x12  2x12 50%  75% No   No    Finger Ladder Abduction  Finger Ladder Flexion 1x6, 1x8  2x8 NW, 1lbs  1lbs No   No    Seated Chicken Wing 1x12, 2x10 NW, 2lbs No    Supine Shoulder ER/IR AROM w/ bolster 1x10, 3x8 NW, 1lbs No    Seated Row 4x10 #30 No    Seated multiangular ER/IR   YTB with IR  No resistance ER No   Supine Dual Shoulder Flex w/ Cane on Slant table 2x12, 1x12 45 degree and 40 degree  2lbs AW on L No       Assessment: Patient was discharged during today's session as both his AROM and strength levels have improved significantly.  The main remaining deficit for the L shoulder appears to be ER strength, but this is even a half grade stronger than the R, previously injured, side. I do believe a partial tear of the infraspinatus/teres minor muscle(s) is likely however the significant regain in function does not seem to indicate the need for an MRI at this time. Patient was instructed to continue with his HEP and he did give verbal acknowledgement of this.     Short term goals (3 weeks):  -Patient will achieve a L shoulder abduction AROM of at least 110 degrees. (Met 12/15/2022)  -Patient will achieve a L shoulder flexion AROM of at least 142 degrees. (Met 12/15/2022)  -Patient will achieve a L shoulder IR AROM of at least 20 degrees.  (Met 12/15/2022)  -Patient will achieve a L shoulder ER AROM of at least 50 degrees.  (Met 12/15/2022)  -Patient will achieve a L shoulder ER MMT of at least 4-. (Met 12/15/2022)  -Patient will report a peak pain level in the L shoulder of no greater than 5/10. (Met 12/15/2022)  -Patient will achieve a PSFS rating of at least 6. (Met 12/15/2022)     Long term goals (6 weeks):  -Patient will achieve a L shoulder abduction AROM of at least 130 degrees. (Met 12/15/2022)  -Patient will achieve a L shoulder flexion AROM of at least 150 degrees. (Met 12/15/2022)  -Patient will achieve a L shoulder IR AROM of at least 30 degrees.  (Met  12/15/2022)  -Patient will achieve a L shoulder ER AROM of at least 58 degrees.  (Partially met 12/15/2022)  -Patient will achieve a L shoulder ER MMT of at least 4. (Met 12/15/2022)  -Patient will report a peak pain level in the L shoulder of no greater than 3/10. (Met 12/15/2022)  -Patient will achieve a PSFS rating of at least 8. (Met 12/15/2022)  -Patient will be independent with his HEP. (Met 12/15/2022)     Plan: Patient discharged during today's session.    Total Session Time 27  THERAPEUTIC EXERCISE 27 minutes      Creig Hines, PT  12/15/2022, 14:05

## 2022-12-19 ENCOUNTER — Ambulatory Visit (HOSPITAL_COMMUNITY): Payer: Self-pay

## 2022-12-20 ENCOUNTER — Ambulatory Visit (HOSPITAL_COMMUNITY): Payer: Self-pay

## 2022-12-21 ENCOUNTER — Ambulatory Visit (HOSPITAL_COMMUNITY): Payer: Self-pay

## 2023-01-19 ENCOUNTER — Ambulatory Visit (INDEPENDENT_AMBULATORY_CARE_PROVIDER_SITE_OTHER): Payer: Medicare PPO | Admitting: Internal Medicine

## 2023-01-19 ENCOUNTER — Other Ambulatory Visit: Payer: Self-pay

## 2023-01-19 ENCOUNTER — Encounter (INDEPENDENT_AMBULATORY_CARE_PROVIDER_SITE_OTHER): Payer: Self-pay | Admitting: Internal Medicine

## 2023-01-19 ENCOUNTER — Other Ambulatory Visit: Payer: Medicare PPO | Attending: Internal Medicine | Admitting: Internal Medicine

## 2023-01-19 VITALS — BP 127/68 | HR 54 | Resp 16 | Ht 68.0 in | Wt 173.0 lb

## 2023-01-19 DIAGNOSIS — Z955 Presence of coronary angioplasty implant and graft: Secondary | ICD-10-CM

## 2023-01-19 DIAGNOSIS — I10 Essential (primary) hypertension: Secondary | ICD-10-CM

## 2023-01-19 DIAGNOSIS — I5043 Acute on chronic combined systolic (congestive) and diastolic (congestive) heart failure: Secondary | ICD-10-CM

## 2023-01-19 DIAGNOSIS — R5382 Chronic fatigue, unspecified: Secondary | ICD-10-CM | POA: Insufficient documentation

## 2023-01-19 DIAGNOSIS — G8929 Other chronic pain: Secondary | ICD-10-CM

## 2023-01-19 DIAGNOSIS — I480 Paroxysmal atrial fibrillation: Secondary | ICD-10-CM

## 2023-01-19 DIAGNOSIS — M25512 Pain in left shoulder: Secondary | ICD-10-CM

## 2023-01-19 DIAGNOSIS — G4733 Obstructive sleep apnea (adult) (pediatric): Secondary | ICD-10-CM

## 2023-01-19 DIAGNOSIS — Z9989 Dependence on other enabling machines and devices: Secondary | ICD-10-CM

## 2023-01-19 DIAGNOSIS — I2102 ST elevation (STEMI) myocardial infarction involving left anterior descending coronary artery: Secondary | ICD-10-CM

## 2023-01-19 DIAGNOSIS — R002 Palpitations: Secondary | ICD-10-CM

## 2023-01-19 DIAGNOSIS — I493 Ventricular premature depolarization: Secondary | ICD-10-CM

## 2023-01-19 DIAGNOSIS — E7800 Pure hypercholesterolemia, unspecified: Secondary | ICD-10-CM

## 2023-01-19 LAB — POCT URINE DIPSTICK
BILIRUBIN: NEGATIVE
BLOOD: NEGATIVE
GLUCOSE: NEGATIVE
KETONE: NEGATIVE
LEUKOCYTES: NEGATIVE
NITRITE: NEGATIVE
PH: 6.5
PROTEIN: NEGATIVE
SPECIFIC GRAVITY: 1.015
UROBILINOGEN: 0.2

## 2023-01-19 LAB — COMPREHENSIVE METABOLIC PANEL, NON-FASTING
ALBUMIN/GLOBULIN RATIO: 1.6 — ABNORMAL HIGH (ref 0.8–1.4)
ALBUMIN: 4.6 g/dL (ref 3.5–5.7)
ALKALINE PHOSPHATASE: 88 U/L (ref 34–104)
ALT (SGPT): 20 U/L (ref 7–52)
ANION GAP: 7 mmol/L (ref 4–13)
AST (SGOT): 19 U/L (ref 13–39)
BILIRUBIN TOTAL: 0.6 mg/dL (ref 0.3–1.0)
BUN/CREA RATIO: 19 (ref 6–22)
BUN: 24 mg/dL (ref 7–25)
CALCIUM, CORRECTED: 8.9 mg/dL (ref 8.9–10.8)
CALCIUM: 9.4 mg/dL (ref 8.6–10.3)
CHLORIDE: 104 mmol/L (ref 98–107)
CO2 TOTAL: 30 mmol/L (ref 21–31)
CREATININE: 1.26 mg/dL (ref 0.60–1.30)
ESTIMATED GFR: 62 mL/min/{1.73_m2} (ref >59)
GLOBULIN: 2.9 (ref 2.9–5.4)
GLUCOSE: 119 mg/dL — ABNORMAL HIGH (ref 74–109)
OSMOLALITY, CALCULATED: 287 mOsm/kg (ref 270–290)
POTASSIUM: 4.6 mmol/L (ref 3.5–5.1)
PROTEIN TOTAL: 7.5 g/dL (ref 6.4–8.9)
SODIUM: 141 mmol/L (ref 136–145)

## 2023-01-19 LAB — CBC
HCT: 43.9 % (ref 36.7–47.1)
HGB: 14.8 g/dL (ref 12.5–16.3)
MCH: 31.3 pg (ref 23.8–33.4)
MCHC: 33.7 g/dL (ref 32.5–36.3)
MCV: 92.9 fL (ref 73.0–96.2)
MPV: 9 fL (ref 7.4–11.4)
PLATELETS: 141 10*3/uL (ref 140–440)
RBC: 4.72 10*6/uL (ref 4.06–5.63)
RDW: 14.2 % (ref 12.1–16.2)
WBC: 6.3 10*3/uL (ref 3.6–10.2)

## 2023-01-19 LAB — MAGNESIUM: MAGNESIUM: 1.8 mg/dL — ABNORMAL LOW (ref 1.9–2.7)

## 2023-01-19 LAB — THYROID STIMULATING HORMONE (SENSITIVE TSH): TSH: 1.147 u[IU]/mL (ref 0.450–5.330)

## 2023-01-19 NOTE — Addendum Note (Signed)
Addended by: Magdalene Molly on: 01/19/2023 03:54 PM     Modules accepted: Orders

## 2023-01-19 NOTE — Procedures (Signed)
I have review the EKG, my interpretation is as follows: PVC

## 2023-01-19 NOTE — Progress Notes (Signed)
INTERNAL MEDICINE, [facility]  [address]  [place] [address]       Name: Brandon Wells. MRN:  [id number]   Date: 01/19/2023 Age: 72 y.o.       Chief Complaint:    Chief Complaint   Patient presents with    Follow Up     4 week check. For chronic disease management.    Bruising        HPI:  Brandon Wells. is a 72 y.o. male who presents to the office today for follow-up.  Patient is actually doing relatively well and except for minor issues as described below which I have addressed in total with the patient.        Past Medical History:  Past Medical History:   Diagnosis Date    Allergic rhinitis     Arthritis     Back problem     CPAP (continuous positive airway pressure) dependence     Esophageal reflux     Hypercholesterolemia     Hyperlipidemia     Hypertension     Myocardial infarction (CMS HCC)     Peripheral edema     Personal history of venous thrombosis and embolism     Renal calculi     Sleep apnea     Stented coronary artery     Wears glasses          Past Surgical History:   Procedure Laterality Date    ACHILLES TENDON REPAIR Left     HX CORONARY STENT PLACEMENT      HX HERNIA REPAIR Bilateral     inguinal    HX HIP REPLACEMENT Left 09/28/2022      Current Outpatient Medications   Medication Sig    acetaminophen (TYLENOL ARTHRITIS PAIN) 650 mg Oral Tablet Sustained Release Take 1 Tablet (650 mg total) by mouth Every 8 hours as needed for Pain    atorvastatin (LIPITOR) 80 mg Oral Tablet Take 1 Tablet (80 mg total) by mouth Every night    clopidogreL (PLAVIX) 75 mg Oral Tablet Take 1 Tablet (75 mg total) by mouth Once a day    furosemide (LASIX) 20 mg Oral Tablet Take 1 Tablet (20 mg total) by mouth Once a day    lisinopriL (PRINIVIL) 5 mg Oral Tablet Take 1 Tablet (5 mg total) by mouth Once a day    metoprolol succinate (TOPROL-XL) 25 mg Oral Tablet Sustained Release 24 hr Take 1 Tablet (25 mg total) by mouth Once a day    mometasone (NASONEX) 50 mcg/actuation Nasal  Spray, Non-Aerosol ADMINISTER 2 SPRAYS INTO AFFECTED NOSTRIL(S) ONCE PER DAY AS NEEDED    pantoprazole (PROTONIX) 40 mg Oral Tablet, Delayed Release (E.C.) Take 1 Tablet (40 mg total) by mouth Once a day     Allergies   Allergen Reactions    Hydrocodone Rash    Prednisone  Other Adverse Reaction (Add comment)     Insomnia       Family History:  Family Medical History:       Problem Relation (Age of Onset)    Bone cancer Father    Esophageal cancer Mother    Heart Disease Brother    Lung Cancer Mother              Social History:   Social History     Tobacco Use   Smoking Status Never   Smokeless Tobacco Never     Social History  Substance and Sexual Activity   Alcohol Use Not Currently    Comment: occasionally     Social History     Occupational History    Not on file       Review of Systems:  Review of systems as discussed in HPI    Problem List:  Patient Active Problem List   Diagnosis    STEMI (ST elevation myocardial infarction) (CMS HCC)    Coronary artery disease involving native coronary artery of native heart without angina pectoris    Volume overload    Systolic and diastolic CHF, 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)    OSA on CPAP    Hypoxia    History of placement of stent in LAD coronary artery    History of atrial fibrillation    High cholesterol    Essential hypertension    Elevated troponin    PAF (paroxysmal atrial fibrillation) (CMS HCC)    Knee pain, right anterior    Breast pain, left    Status post total hip replacement, right    Acute pain of left shoulder    Palpitations       Physical Examination:  BP 127/68 (Site: Right Arm, Patient Position: Sitting, Cuff Size: Adult)   Pulse 54   Resp 16   Ht 1.727 m (5\' 8" )   Wt 78.5 kg (173 lb)   SpO2 93%   BMI 26.30 kg/m       Physical Exam  Vitals and nursing note reviewed.   Constitutional:       General: He is not in acute distress.     Appearance: Normal appearance.   HENT:      Head: Normocephalic.      Right Ear: Tympanic membrane normal.      Left  Ear: Tympanic membrane normal.      Nose: Nose normal.      Mouth/Throat:      Mouth: Mucous membranes are moist.   Eyes:      General: No scleral icterus.     Extraocular Movements: Extraocular movements intact.      Conjunctiva/sclera: Conjunctivae normal.      Pupils: Pupils are equal, round, and reactive to light.   Neck:      Vascular: No carotid bruit.   Cardiovascular:      Rate and Rhythm: Normal rate and regular rhythm.      Pulses: Normal pulses.           Dorsalis pedis pulses are 2+ on the right side and 2+ on the left side.        Posterior tibial pulses are 2+ on the right side and 2+ on the left side.      Heart sounds: Normal heart sounds.   Pulmonary:      Effort: Pulmonary effort is normal.      Breath sounds: Normal breath sounds. No wheezing, rhonchi or rales.   Abdominal:      General: Abdomen is flat. Bowel sounds are normal.      Tenderness: There is no abdominal tenderness. There is no guarding.   Musculoskeletal:         General: No swelling.      Left shoulder: Deformity, tenderness and crepitus present. Decreased range of motion. Decreased strength.      Cervical back: Normal range of motion. No tenderness.      Right lower leg: No edema.      Left lower leg: No edema.   Skin:  General: Skin is warm.      Capillary Refill: Capillary refill takes less than 2 seconds.   Neurological:      General: No focal deficit present.      Mental Status: He is alert and oriented to person, place, and time.      Cranial Nerves: No cranial nerve deficit.              Health Maintenance:  Health Maintenance   Topic Date Due    Adult Tdap-Td (1 - Tdap) 04/26/2023 (Originally 02/29/1972)    Shingles Vaccine (1 of 2) 04/26/2023 (Originally 03/01/2003)    Covid-19 Vaccine (4 - 2023-24 season) 04/26/2023 (Originally 02/18/2022)    Pneumococcal Vaccination, Age 15+ (1 of 2 - PCV) 04/26/2023 (Originally 03/01/1959)    Influenza Vaccine (1) 02/19/2023    Colonoscopy  03/01/2023    Depression Screening  10/25/2023     Medicare Annual Wellness Visit - Calendar Year Insurers  Completed    Meningococcal Vaccine  Aged Out    Hepatitis C screening  Discontinued        Assessment:      ICD-10-CM    1. Chronic left shoulder pain  M25.512 Labs ordered here and on this visit will be be addressed by me. I will contact patient and address therapy options once completed.     MRI SHOULDER LEFT WO CONTRAST    G89.29       2. History of placement of stent in LAD coronary artery  Z95.5 Condition stable will continue current therapy.        3. PAF (paroxysmal atrial fibrillation) (CMS HCC)  I48.0 The patient's condition is unchanged from prior.  We have discussed whether there is need for any therapeutic changes and the patient states that they are tolerating current treatments and will just continue with current lifestyle.       4. ST elevation myocardial infarction involving left anterior descending (LAD) coronary artery (CMS HCC)  I21.02 Condition stable will continue current therapy.        5. 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        6. Essential hypertension  I10 Blood Pressure today is stable and will continue current treatment plans     CBC     COMPREHENSIVE METABOLIC PANEL, NON-FASTING     URINALYSIS WITH REFLEX MICROSCOPIC AND CULTURE IF POSITIVE      7. High cholesterol  E78.00 Labs ordered here and on this visit will be be addressed by me. I will contact patient and address therapy options once completed.       8. OSA on CPAP  G47.33 The patient continues to use their CPAP every evening and is compliant with its use.  They continue to benefit from CPAP use and has had improve lifestyle with this.       9. Palpitations  R00.2 EKG (93000)- Performed and Read in Office    EKG shows frequent PVCs      10. 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 Chronic fatigue  R53.82 THYROID STIMULATING HORMONE (SENSITIVE TSH)         Orders Placed This Encounter    MRI  SHOULDER LEFT WO CONTRAST    CBC    COMPREHENSIVE METABOLIC PANEL, NON-FASTING    URINALYSIS WITH REFLEX MICROSCOPIC AND CULTURE IF POSITIVE    MAGNESIUM    THYROID STIMULATING HORMONE (SENSITIVE TSH)  EKG (93000)- Performed and Read in Office         Follow up:  Return in about 3 months (around 04/21/2023).    This note was partially created using voice recognition software and is inherently subject to errors including those of syntax and "sound alike " substitutions which may escape proof reading.  In such instances, original meaning may be extrapolated by contextual derivation.    Lucia Gaskins, DO  01/19/2023, 10:38

## 2023-01-20 ENCOUNTER — Other Ambulatory Visit (INDEPENDENT_AMBULATORY_CARE_PROVIDER_SITE_OTHER): Payer: Self-pay | Admitting: Internal Medicine

## 2023-01-20 DIAGNOSIS — R790 Abnormal level of blood mineral: Secondary | ICD-10-CM

## 2023-01-25 ENCOUNTER — Telehealth (INDEPENDENT_AMBULATORY_CARE_PROVIDER_SITE_OTHER): Payer: Self-pay | Admitting: Internal Medicine

## 2023-01-25 NOTE — Telephone Encounter (Signed)
[facility] AT [facility] FOR MRI L SHOULDER 02-12-23 @4 :15 PM  LMOM/MAILED APPT TO PT RE 01-25-23

## 2023-02-12 ENCOUNTER — Ambulatory Visit (HOSPITAL_COMMUNITY): Payer: Self-pay

## 2023-02-12 ENCOUNTER — Encounter (HOSPITAL_COMMUNITY): Payer: Self-pay

## 2023-02-17 ENCOUNTER — Encounter (INDEPENDENT_AMBULATORY_CARE_PROVIDER_SITE_OTHER): Payer: Self-pay | Admitting: Internal Medicine

## 2023-02-17 DIAGNOSIS — M25512 Pain in left shoulder: Secondary | ICD-10-CM

## 2023-02-17 IMAGING — MR MRI SHOULDER LT W/O CONTRAST
6 series · 38 of 40 positions shown · IV contrast (gadolinium)
Comparison: None available.

﻿EXAM:  20880   MRI SHOULDER LT W/O CONTRAST
INDICATION: Left shoulder pain. Diminished range of motion.  No history of previous shoulder surgery.
TECHNIQUE: Multiplanar, multisequential MRI of the left shoulder was performed without gadolinium contrast.

[Series 7: STIR · oblique · left · 3.5mm · 0.47mm/px · 7 of 18 slices shown]
[im 1/18]
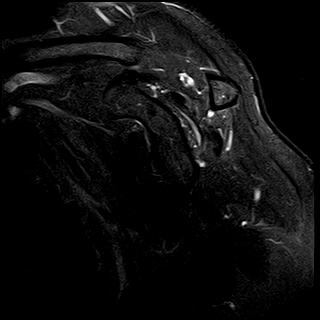
[im 3/18]
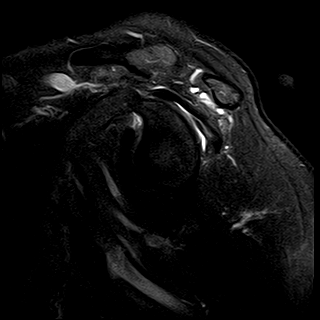
[im 6/18]
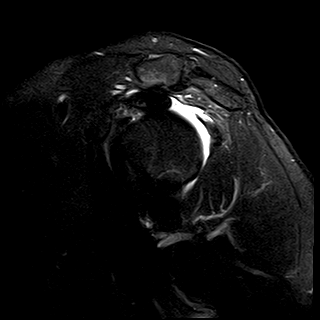
[im 9/18]
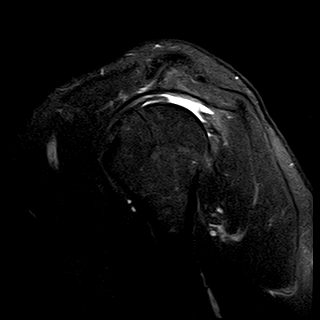
[im 12/18]
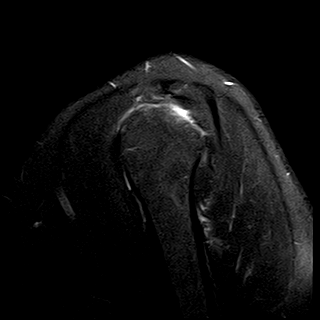
[im 15/18]
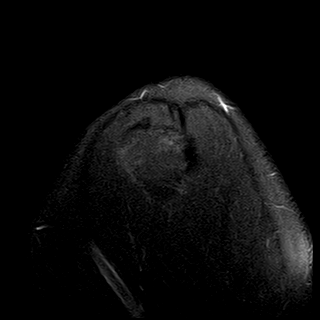
[im 18/18]
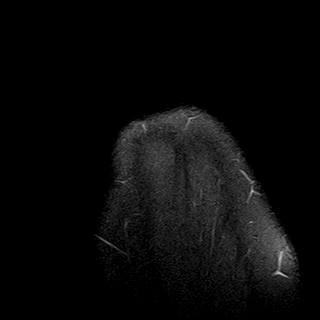

[Series 8: PD fat-sat · axial · left · 4.5mm · 0.50mm/px · z∈[-20,+65]mm · 6 of 18 slices shown]
[im 1/18]
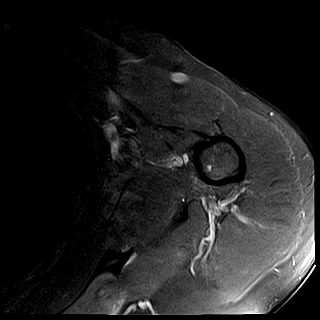
[im 4/18]
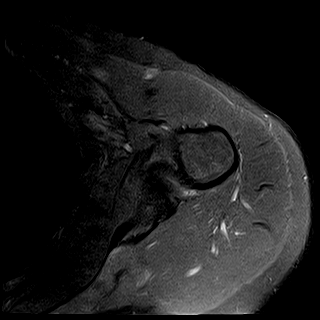
[im 7/18]
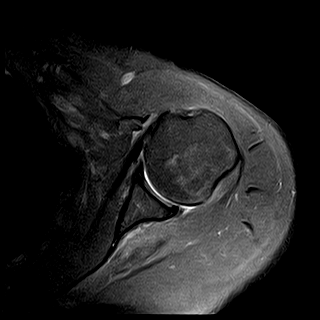
[im 11/18]
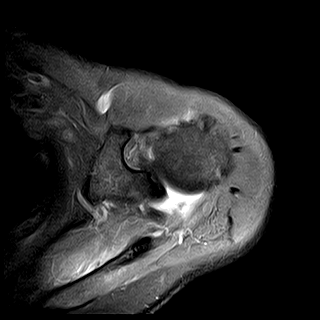
[im 14/18]
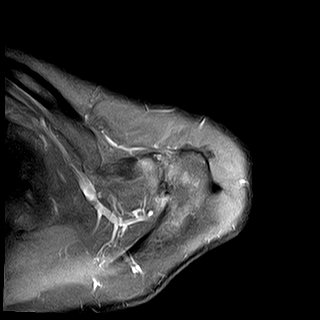
[im 18/18]
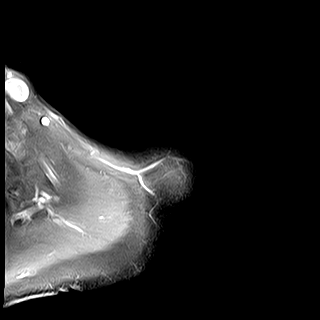

[Series 9: T2 fat-sat · axial · left · 4.0mm · 0.42mm/px · z∈[-29,+74]mm · 8 of 24 slices shown]
[im 1/24]
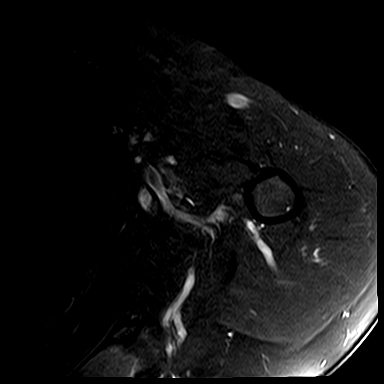
[im 4/24]
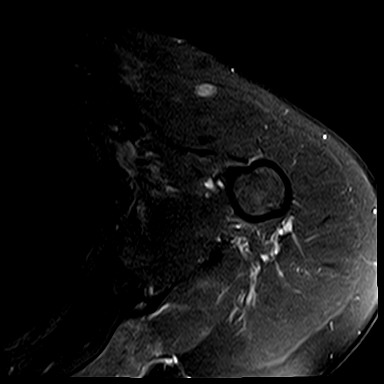
[im 7/24]
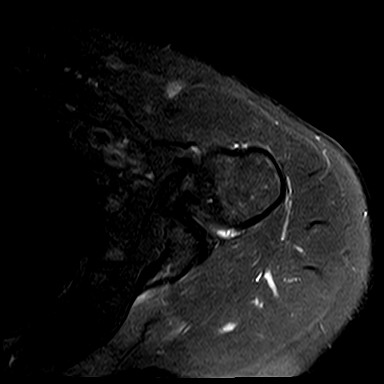
[im 10/24]
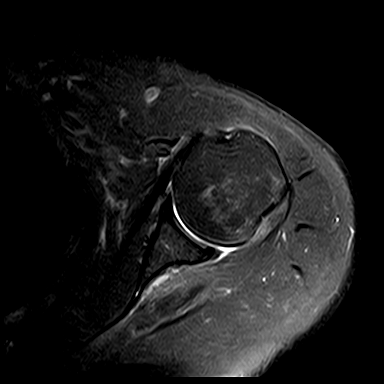
[im 14/24]
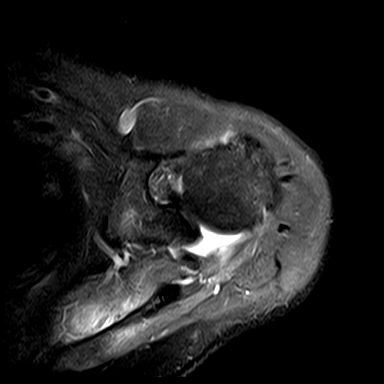
[im 17/24]
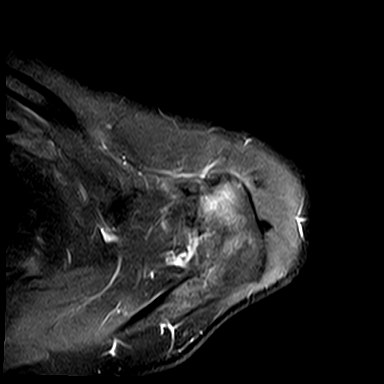
[im 20/24]
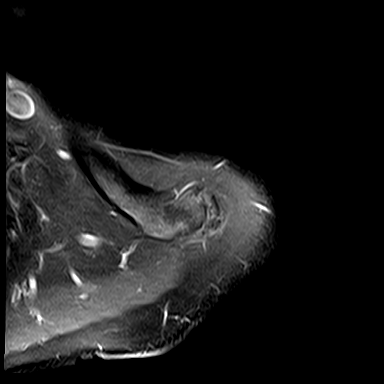
[im 24/24]
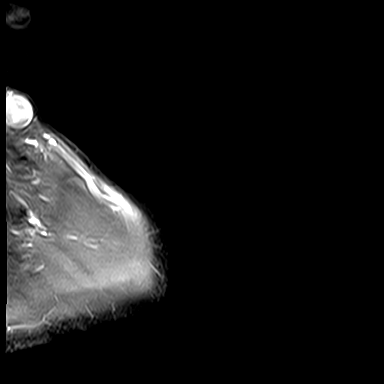

[Series 12: T1 · oblique · left · 4.0mm · 0.33mm/px · 7 of 20 slices shown]
[im 1/20]
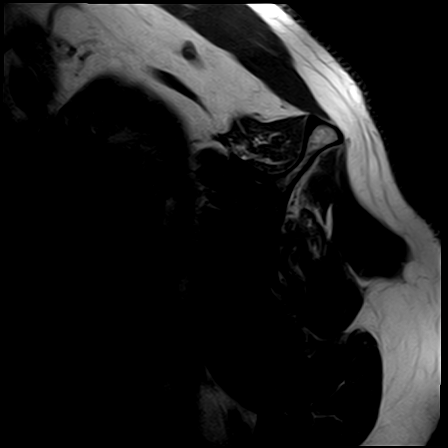
[im 4/20]
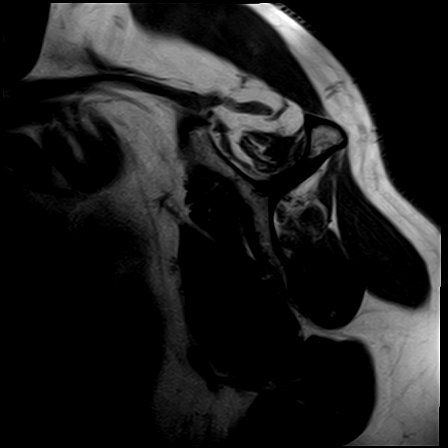
[im 7/20]
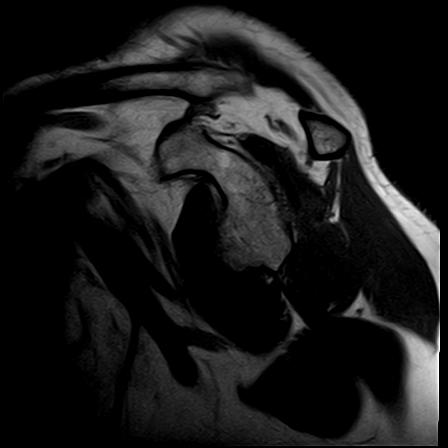
[im 10/20]
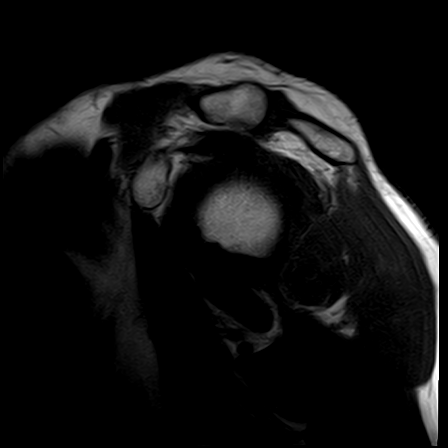
[im 13/20]
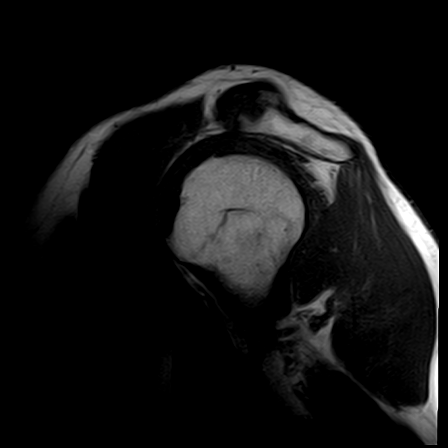
[im 16/20]
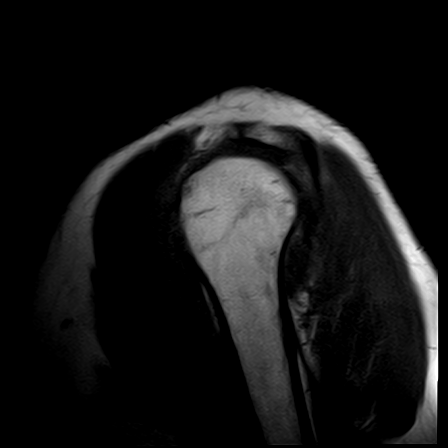
[im 20/20]
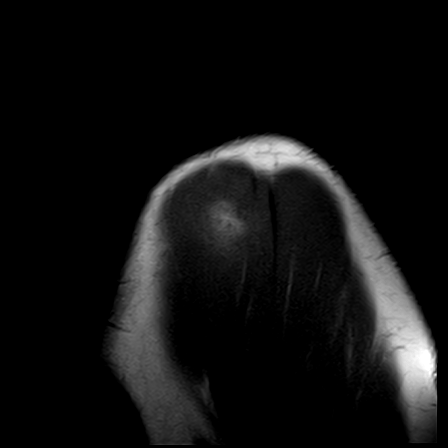

[Series 13: PD · oblique · left · 3.5mm · 0.50mm/px · 6 of 18 slices shown]
[im 1/18]
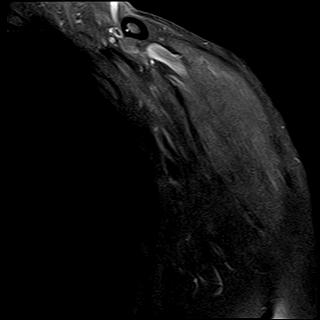
[im 4/18]
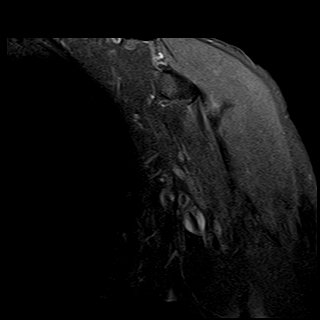
[im 7/18]
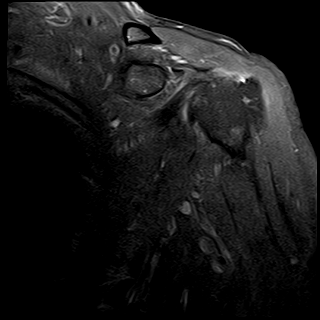
[im 11/18]
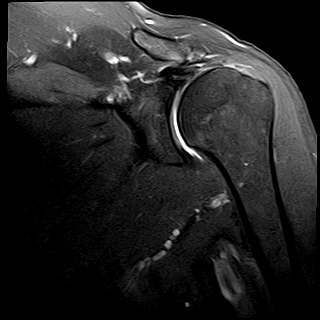
[im 14/18]
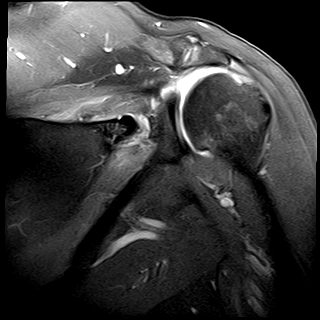
[im 18/18]
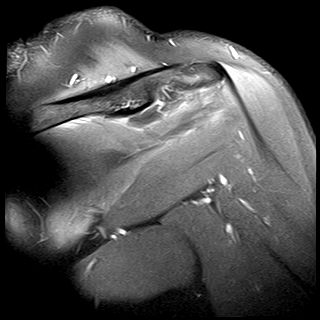

[Series 14: cor (id) · oblique · left · 3.5mm · 0.50mm/px · 4 of 18 slices shown]
[im 1/18]
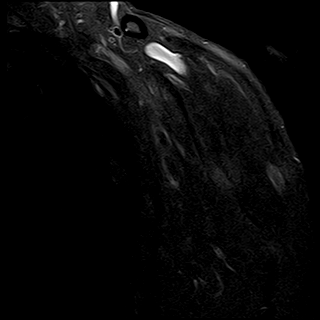
[im 4/18]
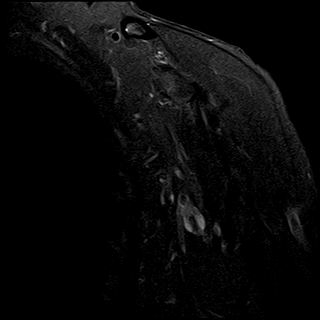
[im 7/18]
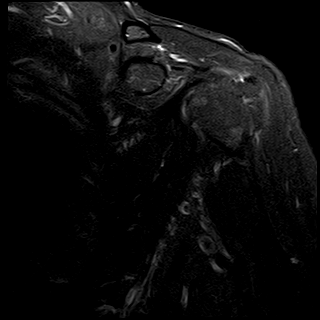
[im 11/18]
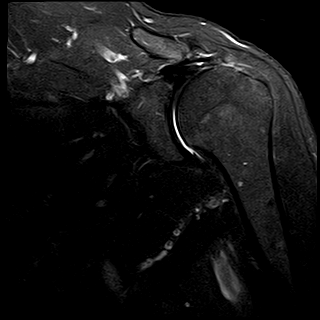

[38 of 40 positions shown; findings below may reference images not displayed]

FINDINGS: No acute bony lesions are seen at the left shoulder.

Irregular, full-thickness, full-width tear of distal supraspinatus tendon is noted measuring approximately 2.5 cm in length.  Irregular, full-thickness tear of the superior aspect of the infraspinatus tendon is also noted.

Degenerative changes of AC joint are impinging on the subacromion space.  Mild superior subluxation of the head of the humerus due to the full-thickness tear of supraspinatus.  Mild supraspinatus muscle atrophy is noted.

No evidence of glenoid labral tear is seen. Long head of biceps tendon is intact.
IMPRESSION: 1. Irregular, full-thickness, full-width tear of distal supraspinatus tendon.  Full-thickness tear of the superior aspect of the infraspinatus tendon. Mild atrophy of supraspinatus muscle.

2. Degenerative changes of AC joint impinging on the subacromion space. Mild superior subluxation of head of the humerus at the glenohumeral joint due to the rotator cuff tear.

## 2023-03-17 ENCOUNTER — Other Ambulatory Visit: Payer: Medicare PPO | Attending: Internal Medicine | Admitting: Internal Medicine

## 2023-03-17 ENCOUNTER — Encounter (INDEPENDENT_AMBULATORY_CARE_PROVIDER_SITE_OTHER): Payer: Self-pay | Admitting: Internal Medicine

## 2023-03-17 ENCOUNTER — Other Ambulatory Visit: Payer: Self-pay

## 2023-03-17 ENCOUNTER — Ambulatory Visit (INDEPENDENT_AMBULATORY_CARE_PROVIDER_SITE_OTHER): Payer: Medicare PPO | Admitting: Internal Medicine

## 2023-03-17 VITALS — BP 131/79 | HR 53 | Resp 18 | Ht 68.0 in | Wt 173.0 lb

## 2023-03-17 DIAGNOSIS — M6789 Other specified disorders of synovium and tendon, multiple sites: Secondary | ICD-10-CM

## 2023-03-17 DIAGNOSIS — M7139 Other bursal cyst, multiple sites: Secondary | ICD-10-CM

## 2023-03-17 DIAGNOSIS — Z1211 Encounter for screening for malignant neoplasm of colon: Secondary | ICD-10-CM

## 2023-03-17 DIAGNOSIS — I2510 Atherosclerotic heart disease of native coronary artery without angina pectoris: Secondary | ICD-10-CM

## 2023-03-17 DIAGNOSIS — Z9989 Dependence on other enabling machines and devices: Secondary | ICD-10-CM

## 2023-03-17 DIAGNOSIS — E7800 Pure hypercholesterolemia, unspecified: Secondary | ICD-10-CM

## 2023-03-17 DIAGNOSIS — M6749 Ganglion, multiple sites: Secondary | ICD-10-CM | POA: Insufficient documentation

## 2023-03-17 DIAGNOSIS — G4733 Obstructive sleep apnea (adult) (pediatric): Secondary | ICD-10-CM

## 2023-03-17 DIAGNOSIS — I10 Essential (primary) hypertension: Secondary | ICD-10-CM | POA: Insufficient documentation

## 2023-03-17 DIAGNOSIS — Z8679 Personal history of other diseases of the circulatory system: Secondary | ICD-10-CM

## 2023-03-17 LAB — BASIC METABOLIC PANEL
ANION GAP: 3 mmol/L — ABNORMAL LOW (ref 4–13)
BUN/CREA RATIO: 22 (ref 6–22)
BUN: 23 mg/dL (ref 7–25)
CALCIUM: 9.2 mg/dL (ref 8.6–10.3)
CHLORIDE: 106 mmol/L (ref 98–107)
CO2 TOTAL: 30 mmol/L (ref 21–31)
CREATININE: 1.05 mg/dL (ref 0.60–1.30)
ESTIMATED GFR: 76 mL/min/{1.73_m2} (ref >59)
GLUCOSE: 116 mg/dL — ABNORMAL HIGH (ref 74–109)
OSMOLALITY, CALCULATED: 282 mosm/kg (ref 270–290)
POTASSIUM: 4.9 mmol/L (ref 3.5–5.1)
SODIUM: 139 mmol/L (ref 136–145)

## 2023-03-17 LAB — MAGNESIUM: MAGNESIUM: 1.9 mg/dL (ref 1.9–2.7)

## 2023-03-17 NOTE — Progress Notes (Signed)
INTERNAL MEDICINE, [facility]  [address]  [place] [address]       Name: Brandon Wells. MRN:  [id number]   Date: 03/17/2023 Age: 72 y.o.       Chief Complaint:    Chief Complaint   Patient presents with    Follow Up     For chronic disease management. 6 week check.    Heel Pain     Lt        HPI:  Brandon Pett. is a 72 y.o. male who presents to the office today for follow-up.  Patient is actually doing relatively well and except for minor issues as described below which I have addressed in total with the patient.        Past Medical History:  Past Medical History:   Diagnosis Date    Allergic rhinitis     Arthritis     Back problem     CPAP (continuous positive airway pressure) dependence     Esophageal reflux     Hypercholesterolemia     Hyperlipidemia     Hypertension     Myocardial infarction (CMS HCC)     Peripheral edema     Personal history of venous thrombosis and embolism     Renal calculi     Sleep apnea     Stented coronary artery     Wears glasses          Past Surgical History:   Procedure Laterality Date    ACHILLES TENDON REPAIR Left     HX CORONARY STENT PLACEMENT      HX HERNIA REPAIR Bilateral     inguinal    HX HIP REPLACEMENT Left 09/28/2022      Current Outpatient Medications   Medication Sig    acetaminophen (TYLENOL ARTHRITIS PAIN) 650 mg Oral Tablet Sustained Release Take 1 Tablet (650 mg total) by mouth Every 8 hours as needed for Pain    atorvastatin (LIPITOR) 80 mg Oral Tablet Take 1 Tablet (80 mg total) by mouth Every night    clopidogreL (PLAVIX) 75 mg Oral Tablet Take 1 Tablet (75 mg total) by mouth Once a day    furosemide (LASIX) 20 mg Oral Tablet Take 1 Tablet (20 mg total) by mouth Once a day    lisinopriL (PRINIVIL) 5 mg Oral Tablet Take 1 Tablet (5 mg total) by mouth Once a day    metoprolol succinate (TOPROL-XL) 25 mg Oral Tablet Sustained Release 24 hr Take 1 Tablet (25 mg total) by mouth Once a day    mometasone (NASONEX) 50  mcg/actuation Nasal Spray, Non-Aerosol ADMINISTER 2 SPRAYS INTO AFFECTED NOSTRIL(S) ONCE PER DAY AS NEEDED    pantoprazole (PROTONIX) 40 mg Oral Tablet, Delayed Release (E.C.) Take 1 Tablet (40 mg total) by mouth Once a day     Allergies   Allergen Reactions    Hydrocodone Rash    Prednisone  Other Adverse Reaction (Add comment)     Insomnia       Family History:  Family Medical History:       Problem Relation (Age of Onset)    Bone cancer Father    Esophageal cancer Mother    Heart Disease Brother    Lung Cancer Mother              Social History:   Social History     Tobacco Use   Smoking Status Never   Smokeless Tobacco Never  Social History     Substance and Sexual Activity   Alcohol Use Not Currently    Comment: occasionally     Social History     Occupational History    Not on file       Review of Systems:  Review of systems as discussed in HPI    Problem List:  Patient Active Problem List   Diagnosis    STEMI (ST elevation myocardial infarction) (CMS HCC)    Coronary artery disease involving native coronary artery of native heart without angina pectoris    Volume overload    Systolic and diastolic CHF, 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)    OSA on CPAP    Hypoxia    History of placement of stent in LAD coronary artery    History of atrial fibrillation    High cholesterol    Essential hypertension    Elevated troponin    PAF (paroxysmal atrial fibrillation) (CMS HCC)    Knee pain, right anterior    Breast pain, left    Status post total hip replacement, right    Acute pain of left shoulder    Palpitations    Ganglion and cyst of synovium, tendon and bursa       Physical Examination:  BP 131/79   Pulse 53   Resp 18   Ht 1.727 m (5\' 8" )   Wt 78.5 kg (173 lb)   SpO2 96%   BMI 26.30 kg/m       Physical Exam  Vitals and nursing note reviewed.   Constitutional:       General: He is not in acute distress.     Appearance: Normal appearance.   HENT:      Head: Normocephalic.      Right Ear: Tympanic membrane normal.       Left Ear: Tympanic membrane normal.      Nose: Nose normal.      Mouth/Throat:      Mouth: Mucous membranes are moist.   Eyes:      General: No scleral icterus.     Extraocular Movements: Extraocular movements intact.      Conjunctiva/sclera: Conjunctivae normal.      Pupils: Pupils are equal, round, and reactive to light.   Neck:      Vascular: No carotid bruit.   Cardiovascular:      Rate and Rhythm: Normal rate and regular rhythm.      Pulses: Normal pulses.           Dorsalis pedis pulses are 2+ on the right side and 2+ on the left side.        Posterior tibial pulses are 2+ on the right side and 2+ on the left side.      Heart sounds: Normal heart sounds.   Pulmonary:      Effort: Pulmonary effort is normal.      Breath sounds: Normal breath sounds. No wheezing, rhonchi or rales.   Abdominal:      General: Abdomen is flat. Bowel sounds are normal.      Tenderness: There is no abdominal tenderness. There is no guarding.   Musculoskeletal:         General: No swelling.      Left shoulder: Deformity, tenderness and crepitus present. Decreased range of motion. Decreased strength.      Cervical back: Normal range of motion. No tenderness.      Right lower leg: No edema.      Left  lower leg: No edema.   Skin:     General: Skin is warm.      Capillary Refill: Capillary refill takes less than 2 seconds.   Neurological:      General: No focal deficit present.      Mental Status: He is alert and oriented to person, place, and time.      Cranial Nerves: No cranial nerve deficit.              Health Maintenance:  Health Maintenance   Topic Date Due    Colonoscopy  Never done    Influenza Vaccine (1) 04/16/2023 (Originally 02/19/2023)    Adult Tdap-Td (1 - Tdap) 04/26/2023 (Originally 02/29/1972)    Shingles Vaccine (1 of 2) 04/26/2023 (Originally 03/01/2003)    Pneumococcal Vaccination, Age 53+ (1 of 2 - PCV) 04/26/2023 (Originally 03/01/1959)    Depression Screening  10/25/2023    Medicare Annual Wellness Visit - Calendar  Year Insurers  Completed    Hepatitis C screening  Discontinued    Covid-19 Vaccine  Discontinued        Assessment:      ICD-10-CM    1. Ganglion and cyst of synovium, tendon and bursa  M67.49 Conservative treatment. Possible ortho excision.    M71.39     M67.89       2. Colon cancer screening  Z12.11 Cologuard colon cancer screening      3. Coronary artery disease involving native coronary artery of native heart without angina pectoris  I25.10 Clinically without symptoms or evidence of recurrent disease. Continue current treatment plan as stable.       4. History of atrial fibrillation  Z86.79 Clinically without symptoms or evidence of recurrent disease. Continue current treatment plan as stable.       5. Essential hypertension  I10 Blood Pressure today is stable and will continue current treatment plans       6. High cholesterol  E78.00 Labs ordered here and on this visit will be be addressed by me. I will contact patient and address therapy options once completed.       7. OSA on CPAP  G47.33 The patient continues to use their CPAP every evening and is compliant with its use.  They continue to benefit from CPAP use and has had improve lifestyle with this.          Orders Placed This Encounter    Cologuard colon cancer screening     There are no discontinued medications.       Follow up:  Return in about 3 months (around 06/16/2023).    This note was partially created using voice recognition software and is inherently subject to errors including those of syntax and "sound alike " substitutions which may escape proof reading.  In such instances, original meaning may be extrapolated by contextual derivation.    Lucia Gaskins, DO  03/17/2023, 10:35

## 2023-03-17 NOTE — Addendum Note (Signed)
Addended byLacey Jensen on: 03/17/2023 10:38 AM     Modules accepted: Orders

## 2023-03-22 ENCOUNTER — Other Ambulatory Visit: Payer: Medicare PPO | Attending: Family Medicine

## 2023-03-22 ENCOUNTER — Other Ambulatory Visit (HOSPITAL_COMMUNITY): Payer: Self-pay | Admitting: Orthopaedic Surgery

## 2023-03-22 ENCOUNTER — Other Ambulatory Visit: Payer: Self-pay

## 2023-03-22 ENCOUNTER — Inpatient Hospital Stay (HOSPITAL_BASED_OUTPATIENT_CLINIC_OR_DEPARTMENT_OTHER)
Admission: RE | Admit: 2023-03-22 | Discharge: 2023-03-22 | Disposition: A | Discharge status: Home / Self Care | Payer: Medicare PPO | Source: Ambulatory Visit | Attending: Orthopaedic Surgery | Admitting: Orthopaedic Surgery

## 2023-03-22 DIAGNOSIS — I252 Old myocardial infarction: Secondary | ICD-10-CM

## 2023-03-22 DIAGNOSIS — M170 Bilateral primary osteoarthritis of knee: Secondary | ICD-10-CM

## 2023-03-22 DIAGNOSIS — Z01818 Encounter for other preprocedural examination: Secondary | ICD-10-CM

## 2023-03-22 DIAGNOSIS — R001 Bradycardia, unspecified: Secondary | ICD-10-CM

## 2023-03-22 DIAGNOSIS — R9431 Abnormal electrocardiogram [ECG] [EKG]: Secondary | ICD-10-CM

## 2023-03-22 LAB — COMPREHENSIVE METABOLIC PANEL, NON-FASTING
ALBUMIN/GLOBULIN RATIO: 1.9 — ABNORMAL HIGH (ref 0.8–1.4)
ALBUMIN: 4.6 g/dL (ref 3.5–5.7)
ALKALINE PHOSPHATASE: 81 U/L (ref 34–104)
ALT (SGPT): 22 U/L (ref 7–52)
ANION GAP: 4 mmol/L (ref 4–13)
AST (SGOT): 19 U/L (ref 13–39)
BILIRUBIN TOTAL: 0.6 mg/dL (ref 0.3–1.0)
BUN/CREA RATIO: 21 (ref 6–22)
BUN: 23 mg/dL (ref 7–25)
CALCIUM, CORRECTED: 8.7 mg/dL — ABNORMAL LOW (ref 8.9–10.8)
CALCIUM: 9.2 mg/dL (ref 8.6–10.3)
CHLORIDE: 106 mmol/L (ref 98–107)
CO2 TOTAL: 31 mmol/L (ref 21–31)
CREATININE: 1.1 mg/dL (ref 0.60–1.30)
ESTIMATED GFR: 72 mL/min/{1.73_m2} (ref >59)
GLOBULIN: 2.4 — ABNORMAL LOW (ref 2.9–5.4)
GLUCOSE: 119 mg/dL — ABNORMAL HIGH (ref 74–109)
OSMOLALITY, CALCULATED: 286 mosm/kg (ref 270–290)
POTASSIUM: 4.4 mmol/L (ref 3.5–5.1)
PROTEIN TOTAL: 7 g/dL (ref 6.4–8.9)
SODIUM: 141 mmol/L (ref 136–145)

## 2023-03-22 LAB — CBC WITH DIFF
BASOPHIL #: 0 10*3/uL (ref 0.00–0.10)
BASOPHIL %: 1 % (ref 0–1)
EOSINOPHIL #: 0.1 10*3/uL (ref 0.00–0.50)
EOSINOPHIL %: 1 % (ref 1–8)
HCT: 42 % (ref 36.7–47.1)
HGB: 14.1 g/dL (ref 12.5–16.3)
LYMPHOCYTE #: 0.9 10*3/uL — ABNORMAL LOW (ref 1.00–3.00)
LYMPHOCYTE %: 21 % (ref 16–44)
MCH: 31.6 pg (ref 23.8–33.4)
MCHC: 33.6 g/dL (ref 32.5–36.3)
MCV: 94.3 fL (ref 73.0–96.2)
MONOCYTE #: 0.4 10*3/uL (ref 0.30–1.00)
MONOCYTE %: 10 % (ref 5–13)
MPV: 7.9 fL (ref 7.4–11.4)
NEUTROPHIL #: 3.1 10*3/uL (ref 1.85–7.80)
NEUTROPHIL %: 68 % (ref 43–77)
PLATELETS: 153 10*3/uL (ref 140–440)
RBC: 4.45 10*6/uL (ref 4.06–5.63)
RDW: 14.8 % (ref 12.1–16.2)
WBC: 4.6 10*3/uL (ref 3.6–10.2)

## 2023-03-22 LAB — URINALYSIS, MACROSCOPIC
BILIRUBIN: NEGATIVE mg/dL
BLOOD: NEGATIVE mg/dL
GLUCOSE: NEGATIVE mg/dL
KETONES: NEGATIVE mg/dL
LEUKOCYTES: NEGATIVE WBCs/uL
NITRITE: NEGATIVE
PH: 5.5 (ref 5.0–9.0)
PROTEIN: NEGATIVE mg/dL
SPECIFIC GRAVITY: 1.01 (ref 1.002–1.030)
UROBILINOGEN: NORMAL mg/dL

## 2023-03-22 LAB — ECG 12 LEAD
Atrial Rate: 52 {beats}/min
Calculated P Axis: 41 degrees
Calculated R Axis: 64 degrees
Calculated T Axis: 158 degrees
PR Interval: 166 ms
QRS Duration: 82 ms
QT Interval: 430 ms
QTC Calculation: 399 ms
Ventricular rate: 52 {beats}/min

## 2023-03-22 LAB — HGA1C (HEMOGLOBIN A1C WITH EST AVG GLUCOSE): HEMOGLOBIN A1C: 6.1 % — ABNORMAL HIGH (ref 4.0–6.0)

## 2023-03-31 ENCOUNTER — Other Ambulatory Visit: Payer: Self-pay

## 2023-03-31 ENCOUNTER — Ambulatory Visit: Payer: Medicare PPO | Attending: Orthopaedic Surgery

## 2023-03-31 DIAGNOSIS — Z01818 Encounter for other preprocedural examination: Secondary | ICD-10-CM | POA: Insufficient documentation

## 2023-03-31 LAB — URINALYSIS, MACROSCOPIC
BILIRUBIN: NEGATIVE mg/dL
BLOOD: NEGATIVE mg/dL
GLUCOSE: NEGATIVE mg/dL
KETONES: NEGATIVE mg/dL
LEUKOCYTES: NEGATIVE WBCs/uL
NITRITE: NEGATIVE
PH: 5 (ref 5.0–9.0)
PROTEIN: NEGATIVE mg/dL
SPECIFIC GRAVITY: 1.011 (ref 1.002–1.030)
UROBILINOGEN: NORMAL mg/dL

## 2023-03-31 LAB — URINALYSIS, MICROSCOPIC
HYALINE CASTS: 3 /[LPF] — ABNORMAL HIGH (ref <0)
RBCS: <1 /[HPF] (ref <4)
WBCS: <1 /[HPF] (ref <6)

## 2023-04-03 LAB — COLOGUARD® COLON CANCER SCREEN: COLOGUARD RESULT: NEGATIVE

## 2023-04-12 ENCOUNTER — Encounter (HOSPITAL_COMMUNITY): Admission: RE | Payer: Self-pay | Source: Ambulatory Visit

## 2023-04-12 ENCOUNTER — Inpatient Hospital Stay: Admission: RE | Admit: 2023-04-12 | Payer: Medicare PPO | Source: Ambulatory Visit | Admitting: Orthopaedic Surgery

## 2023-04-12 SURGERY — ARTHROPLASTY KNEE TOTAL BILATERAL
Site: Knee | Laterality: Bilateral

## 2023-04-17 IMAGING — MR MRI LUMBAR SPINE WITHOUT CONTRAST
9 series · 48 of 48 positions shown · IV contrast (gadolinium)
Comparison: MRI lumbar spine dated 09/18/2017.

﻿EXAM:  [id number]   MRI LUMBAR SPINE WITHOUT CONTRAST
INDICATION: 72-year-old male with pain radiating to right lower extremity.  No history of back surgery. No history of malignancy.
TECHNIQUE: Multiplanar, multisequential MRI of the lumbosacral spine was performed without gadolinium contrast.

[Series 4: sca (id) · sagittal · 10.0mm · 1.76mm/px · 1 of 5 slices shown (1 of 3)]
[im 1/5]
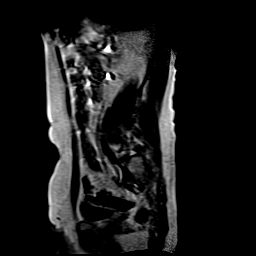

[Series 5: sca (id) · coronal · 10.0mm · 1.76mm/px · 2 of 5 slices shown (2 of 3)]
[im 1/5]
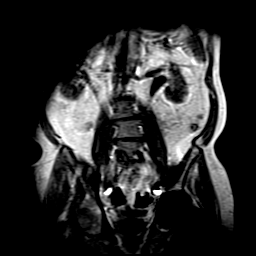
[im 5/5]
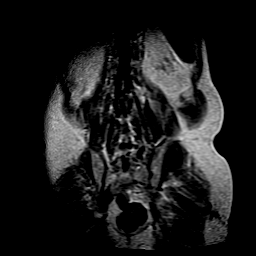

[Series 6: sca (id) · axial · 10.0mm · 1.76mm/px · z∈[+9,+69]mm · 2 of 5 slices shown (3 of 3)]
[im 1/5]
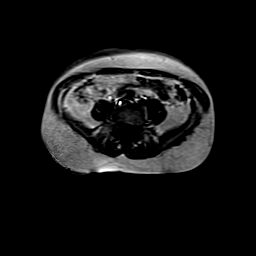
[im 5/5]
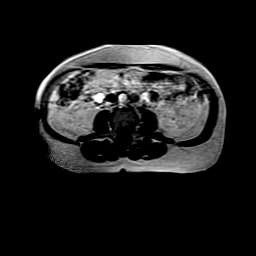

[Series 7: T2 · sagittal · 6.0mm · 1.00mm/px · 5 of 13 slices shown (1 of 3)]
[im 1/13]
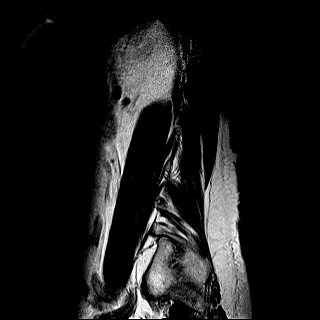
[im 4/13]
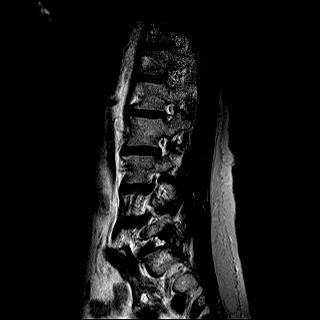
[im 7/13]
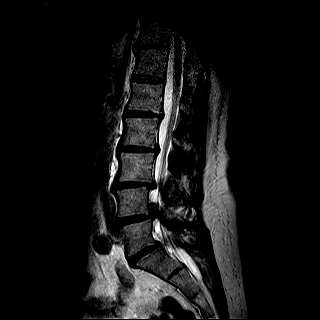
[im 10/13]
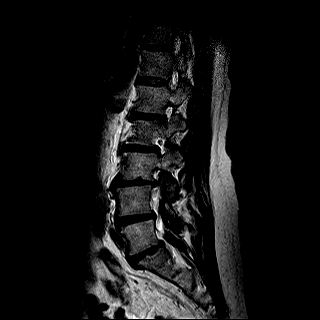
[im 13/13]
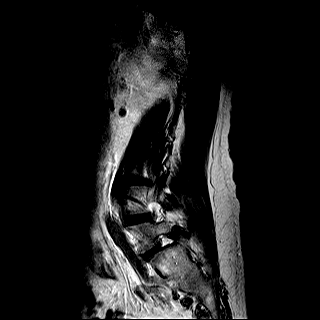

[Series 8: T1 · sagittal · 6.0mm · 1.00mm/px · 5 of 13 slices shown (1 of 2)]
[im 1/13]
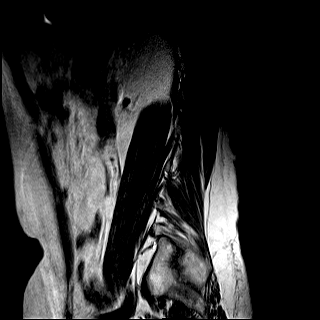
[im 4/13]
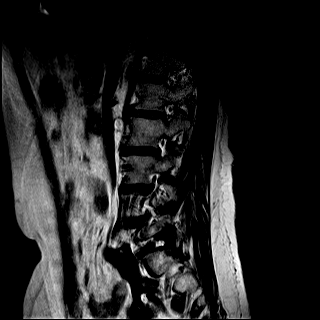
[im 7/13]
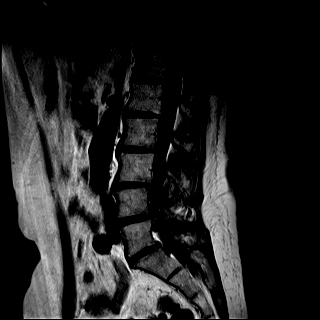
[im 10/13]
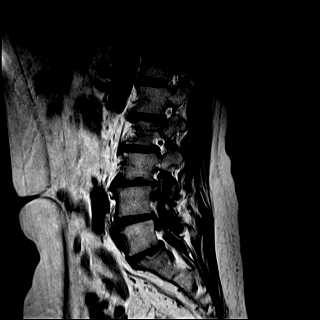
[im 13/13]
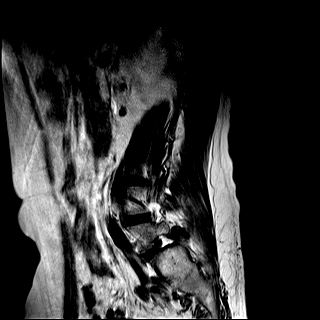

[Series 9: STIR · sagittal · 6.0mm · 1.25mm/px · 5 of 13 slices shown]
[im 1/13]
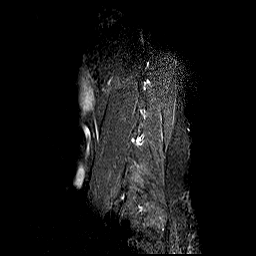
[im 4/13]
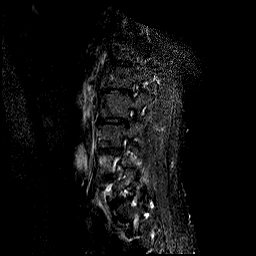
[im 7/13]
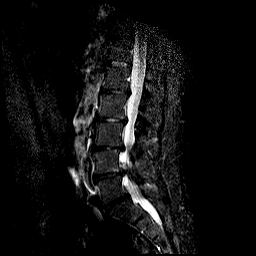
[im 10/13]
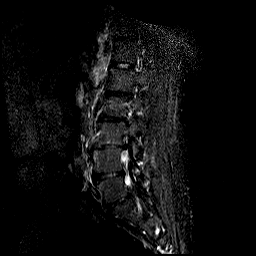
[im 13/13]
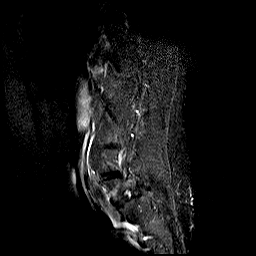

[Series 10: T2 · axial · 5.0mm · 0.89mm/px · z∈[-58,+141]mm · 10 of 25 slices shown (2 of 3)]
[im 1/25]
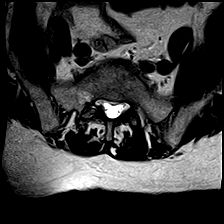
[im 3/25]
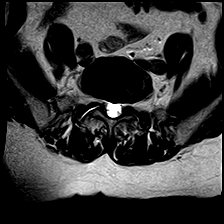
[im 6/25]
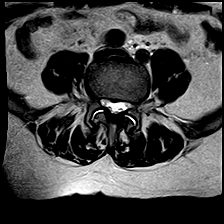
[im 9/25]
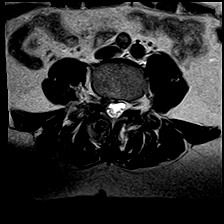
[im 11/25]
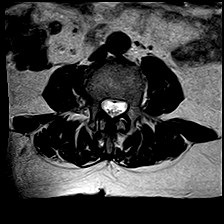
[im 14/25]
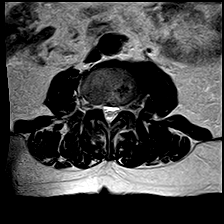
[im 17/25]
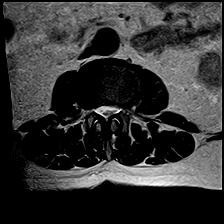
[im 19/25]
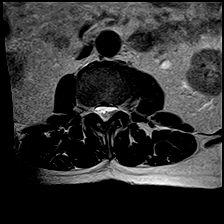
[im 22/25]
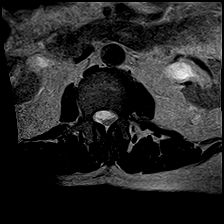
[im 25/25]
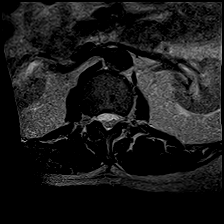

[Series 11: T1 · axial · 5.0mm · 0.89mm/px · z∈[-58,+141]mm · 10 of 25 slices shown (2 of 2)]
[im 1/25]
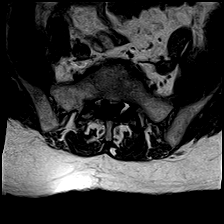
[im 3/25]
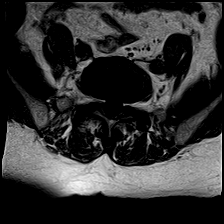
[im 6/25]
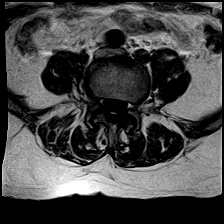
[im 9/25]
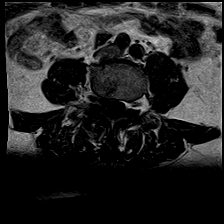
[im 11/25]
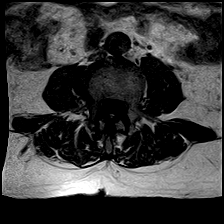
[im 14/25]
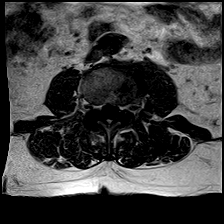
[im 17/25]
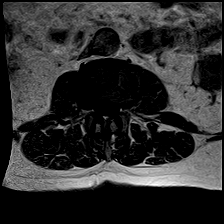
[im 19/25]
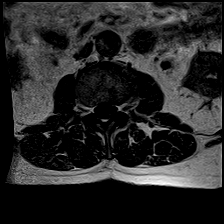
[im 22/25]
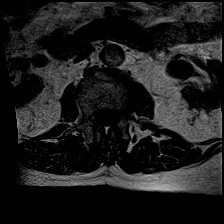
[im 25/25]
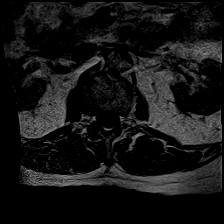

[Series 12: T2 · coronal · 5.0mm · 1.34mm/px · 8 of 20 slices shown (3 of 3)]
[im 1/20]
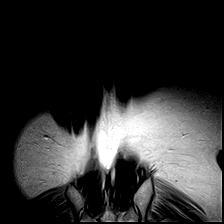
[im 3/20]
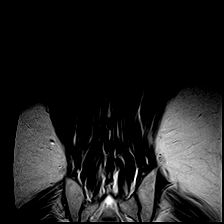
[im 6/20]
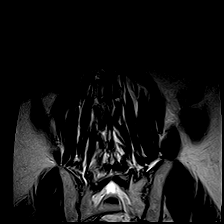
[im 9/20]
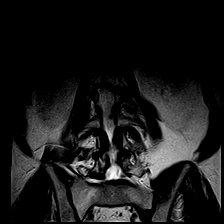
[im 11/20]
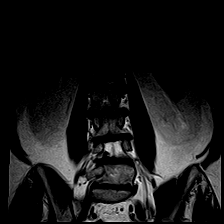
[im 14/20]
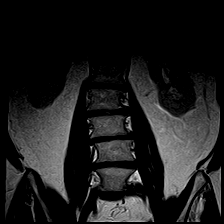
[im 17/20]
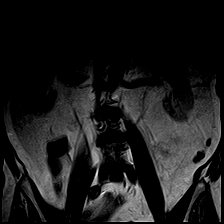
[im 20/20]
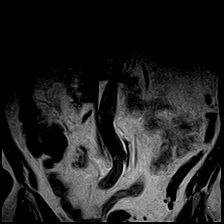

[48 of 48 positions shown; findings below may reference images not displayed]

FINDINGS: No acute or focal bone changes of lumbar vertebrae.  Conus terminates at T12-L1 level. Cauda structures are normal. 

At L1-2 level, degenerative disc disease and facet arthropathy are noted causing moderate compromise of lateral recess, right more than the left.  The changes are slightly more prominent compared with 09/18/2017.

At L2-3 level, significant bilateral facet arthropathy is noted along with degenerative disc disease causing moderate compromise of both lateral recess and mild compromise of thecal sac with AP diameter in the midline measuring 9 mm. Findings at this level are more prominent compared with previous examination. 

At L3-4 level, significant facet arthropathy is noted with minimal retrolisthesis along with degenerative disc disease with bulging annulus.  Significant compromise of both lateral recess and neural foramina are noted, right worse than the left along with significant compromise of thecal sac with AP diameter in the midline measuring 4.8 mm. Findings at this level are more prominent compared with 09/18/2017. 

At L4-5 level, significant degenerative disc disease with bulging annulus and severe facet arthropathy are noted with hypertrophy of dorsal ligaments.  Facet changes are more prominent on the right side.  Severe compromise of right lateral recess and right neural foramen are noted with moderate compromise of thecal sac and left neural foramen.  Findings at this level are significantly more prominent compared with 09/18/2017.

At L5-S1 level, degenerative disc disease and significant facet arthropathy are noted causing significant biforaminal stenosis, more prominent compared with 09/18/2017.
IMPRESSION: 1. No acute or focal bone changes of lumbar vertebrae. 

2. At L4-5 level, significant degenerative disc disease with bulging annulus and severe facet arthropathy are noted with hypertrophy of dorsal ligaments.  Facet changes are more prominent on the right side.  Severe compromise of right lateral recess and right neural foramen are noted with moderate compromise of thecal sac and left neural foramen.  Findings at this level are significantly more prominent compared with 09/18/2017.

3. Findings at other levels are described above in detail at each level.

4. Paravertebral soft tissues are unremarkable.

Electronically Signed by ERXLEBEN, FERIENHAUS at 28-Act-NUNL [DATE]

## 2023-06-28 ENCOUNTER — Ambulatory Visit (HOSPITAL_COMMUNITY): Payer: Medicare PPO

## 2023-06-28 ENCOUNTER — Ambulatory Visit
Admission: RE | Admit: 2023-06-28 | Discharge: 2023-06-28 | Disposition: A | Discharge status: Home / Self Care | Payer: Medicare PPO | Source: Ambulatory Visit | Attending: Orthopaedic Surgery | Admitting: Orthopaedic Surgery

## 2023-06-28 ENCOUNTER — Other Ambulatory Visit (HOSPITAL_COMMUNITY): Payer: Self-pay | Admitting: Orthopaedic Surgery

## 2023-06-28 ENCOUNTER — Other Ambulatory Visit: Payer: Self-pay

## 2023-06-28 DIAGNOSIS — Z01818 Encounter for other preprocedural examination: Secondary | ICD-10-CM

## 2023-06-28 DIAGNOSIS — Z131 Encounter for screening for diabetes mellitus: Secondary | ICD-10-CM

## 2023-06-28 DIAGNOSIS — Z01810 Encounter for preprocedural cardiovascular examination: Secondary | ICD-10-CM

## 2023-06-28 DIAGNOSIS — I252 Old myocardial infarction: Secondary | ICD-10-CM

## 2023-06-28 DIAGNOSIS — R001 Bradycardia, unspecified: Secondary | ICD-10-CM

## 2023-06-28 LAB — BASIC METABOLIC PANEL
ANION GAP: 6 mmol/L (ref 4–13)
BUN/CREA RATIO: 25 — ABNORMAL HIGH (ref 6–22)
BUN: 30 mg/dL — ABNORMAL HIGH (ref 7–25)
CALCIUM: 9.5 mg/dL (ref 8.6–10.3)
CHLORIDE: 107 mmol/L (ref 98–107)
CO2 TOTAL: 28 mmol/L (ref 21–31)
CREATININE: 1.22 mg/dL (ref 0.60–1.30)
ESTIMATED GFR: 64 mL/min/{1.73_m2} (ref >59)
GLUCOSE: 139 mg/dL — ABNORMAL HIGH (ref 74–109)
OSMOLALITY, CALCULATED: 290 mosm/kg (ref 270–290)
POTASSIUM: 4.5 mmol/L (ref 3.5–5.1)
SODIUM: 141 mmol/L (ref 136–145)

## 2023-06-28 LAB — URINALYSIS, MACROSCOPIC
BILIRUBIN: NEGATIVE mg/dL
BLOOD: NEGATIVE mg/dL
GLUCOSE: NEGATIVE mg/dL
KETONES: NEGATIVE mg/dL
LEUKOCYTES: NEGATIVE WBCs/uL
NITRITE: NEGATIVE
PH: 5.5 (ref 5.0–9.0)
PROTEIN: NEGATIVE mg/dL
SPECIFIC GRAVITY: 1.013 (ref 1.002–1.030)
UROBILINOGEN: NORMAL mg/dL

## 2023-06-28 LAB — COMPREHENSIVE METABOLIC PANEL, NON-FASTING
ALBUMIN/GLOBULIN RATIO: 1.5 — ABNORMAL HIGH (ref 0.8–1.4)
ALBUMIN: 4.5 g/dL (ref 3.5–5.7)
ALKALINE PHOSPHATASE: 85 U/L (ref 34–104)
ALT (SGPT): 21 U/L (ref 7–52)
ANION GAP: 6 mmol/L (ref 4–13)
AST (SGOT): 18 U/L (ref 13–39)
BILIRUBIN TOTAL: 0.7 mg/dL (ref 0.3–1.0)
BUN/CREA RATIO: 24 — ABNORMAL HIGH (ref 6–22)
BUN: 30 mg/dL — ABNORMAL HIGH (ref 7–25)
CALCIUM, CORRECTED: 9.2 mg/dL (ref 8.9–10.8)
CALCIUM: 9.6 mg/dL (ref 8.6–10.3)
CHLORIDE: 106 mmol/L (ref 98–107)
CO2 TOTAL: 30 mmol/L (ref 21–31)
CREATININE: 1.24 mg/dL (ref 0.60–1.30)
ESTIMATED GFR: 63 mL/min/{1.73_m2} (ref >59)
GLOBULIN: 3.1 (ref 2.0–3.5)
GLUCOSE: 138 mg/dL — ABNORMAL HIGH (ref 74–109)
OSMOLALITY, CALCULATED: 292 mosm/kg — ABNORMAL HIGH (ref 270–290)
POTASSIUM: 4.4 mmol/L (ref 3.5–5.1)
PROTEIN TOTAL: 7.6 g/dL (ref 6.4–8.9)
SODIUM: 142 mmol/L (ref 136–145)

## 2023-06-28 LAB — CBC
HCT: 46.4 % (ref 36.7–47.1)
HGB: 16.1 g/dL (ref 12.5–16.3)
MCH: 32.4 pg (ref 23.8–33.4)
MCHC: 34.8 g/dL (ref 32.5–36.3)
MCV: 93.2 fL (ref 73.0–96.2)
MPV: 8.1 fL (ref 7.4–11.4)
PLATELETS: 151 10*3/uL (ref 140–440)
RBC: 4.98 10*6/uL (ref 4.06–5.63)
RDW: 13.1 % (ref 12.1–16.2)
WBC: 6 10*3/uL (ref 3.6–10.2)

## 2023-06-28 LAB — URINALYSIS, MICROSCOPIC
HYALINE CASTS: 1 /[LPF] — ABNORMAL HIGH (ref <0)
WBCS: <1 /[HPF] (ref <6)

## 2023-06-28 LAB — ECG 12 LEAD
Atrial Rate: 59 {beats}/min
Calculated P Axis: 65 degrees
Calculated R Axis: 105 degrees
Calculated T Axis: -52 degrees
PR Interval: 170 ms
QRS Duration: 80 ms
QT Interval: 402 ms
QTC Calculation: 397 ms
Ventricular rate: 59 {beats}/min

## 2023-06-28 LAB — HGA1C (HEMOGLOBIN A1C WITH EST AVG GLUCOSE): HEMOGLOBIN A1C: 6.3 % — ABNORMAL HIGH (ref 4.0–6.0)

## 2023-07-17 ENCOUNTER — Encounter (INDEPENDENT_AMBULATORY_CARE_PROVIDER_SITE_OTHER): Payer: Self-pay | Admitting: Internal Medicine

## 2023-07-17 ENCOUNTER — Other Ambulatory Visit: Payer: Self-pay

## 2023-07-17 ENCOUNTER — Ambulatory Visit (INDEPENDENT_AMBULATORY_CARE_PROVIDER_SITE_OTHER): Payer: Medicare PPO | Admitting: Internal Medicine

## 2023-07-17 VITALS — BP 100/64 | HR 56 | Ht 68.0 in | Wt 175.2 lb

## 2023-07-17 DIAGNOSIS — I2510 Atherosclerotic heart disease of native coronary artery without angina pectoris: Secondary | ICD-10-CM

## 2023-07-17 DIAGNOSIS — I110 Hypertensive heart disease with heart failure: Secondary | ICD-10-CM

## 2023-07-17 DIAGNOSIS — I480 Paroxysmal atrial fibrillation: Secondary | ICD-10-CM

## 2023-07-17 DIAGNOSIS — R0902 Hypoxemia: Secondary | ICD-10-CM

## 2023-07-17 DIAGNOSIS — G4733 Obstructive sleep apnea (adult) (pediatric): Secondary | ICD-10-CM

## 2023-07-17 DIAGNOSIS — I10 Essential (primary) hypertension: Secondary | ICD-10-CM

## 2023-07-17 DIAGNOSIS — I5043 Acute on chronic combined systolic (congestive) and diastolic (congestive) heart failure: Secondary | ICD-10-CM

## 2023-07-17 NOTE — Progress Notes (Signed)
[facility], [facility]  [address]  [place] [address]       Name: Brandon Wells. MRN:  [id number]   Date: 07/17/2023 Age: 73 y.o.       Chief Complaint:    Chief Complaint   Patient presents with    Follow Up 3 Months     He is have cataract surgery on Wednesday. And bilateral knee replacement on 2/5.     N/c for today.         HPI:  Brandon Wells. is a 73 y.o. male who presents to the office today for follow-up.  Patient is having some minor issues as described below which I have addressed in total with the patient.        Past Medical History:  Past Medical History:   Diagnosis Date    Allergic rhinitis     Arthritis     Back problem     CPAP (continuous positive airway pressure) dependence     Esophageal reflux     Hypercholesterolemia     Hyperlipidemia     Hypertension     Myocardial infarction (CMS HCC)     Peripheral edema     Personal history of venous thrombosis and embolism     Renal calculi     Sleep apnea     Stented coronary artery     Wears glasses          Past Surgical History:   Procedure Laterality Date    ACHILLES TENDON REPAIR Left     HX CORONARY STENT PLACEMENT      HX HERNIA REPAIR Bilateral     inguinal    HX HIP REPLACEMENT Left 09/28/2022      Current Outpatient Medications   Medication Sig    acetaminophen (TYLENOL ARTHRITIS PAIN) 650 mg Oral Tablet Sustained Release Take 1 Tablet (650 mg total) by mouth Every 8 hours as needed for Pain    atorvastatin (LIPITOR) 80 mg Oral Tablet Take 1 Tablet (80 mg total) by mouth Every night    clopidogreL (PLAVIX) 75 mg Oral Tablet Take 1 Tablet (75 mg total) by mouth Once a day    furosemide (LASIX) 20 mg Oral Tablet Take 1 Tablet (20 mg total) by mouth Once a day    lisinopriL (PRINIVIL) 5 mg Oral Tablet Take 1 Tablet (5 mg total) by mouth Once a day    metoprolol succinate (TOPROL-XL) 25 mg Oral Tablet Sustained Release 24 hr Take 1 Tablet (25 mg total) by mouth Once a day    mometasone (NASONEX) 50  mcg/actuation Nasal Spray, Non-Aerosol ADMINISTER 2 SPRAYS INTO AFFECTED NOSTRIL(S) ONCE PER DAY AS NEEDED     Allergies   Allergen Reactions    Hydrocodone Rash    Prednisone  Other Adverse Reaction (Add comment)     Insomnia       Family History:  Family Medical History:       Problem Relation (Age of Onset)    Bone cancer Father    Esophageal cancer Mother    Heart Disease Brother    Lung Cancer Mother              Social History:   Social History     Tobacco Use   Smoking Status Never   Smokeless Tobacco Never     Social History     Substance and Sexual Activity   Alcohol Use Not Currently  Comment: occasionally     Social History     Occupational History    Not on file       Review of Systems:  Review of systems as discussed in HPI    Problem List:  Patient Active Problem List   Diagnosis    STEMI (ST elevation myocardial infarction) (CMS HCC)    Coronary artery disease involving native coronary artery of native heart without angina pectoris    Volume overload    Systolic and diastolic CHF, 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)    OSA on CPAP    Hypoxia    History of placement of stent in LAD coronary artery    History of atrial fibrillation    High cholesterol    Essential hypertension    PAF (paroxysmal atrial fibrillation) (CMS HCC)    Knee pain, right anterior    Breast pain, left    Status post total hip replacement, right    Acute pain of left shoulder    Palpitations    Ganglion and cyst of synovium, tendon and bursa       Physical Examination:  BP 100/64 (Site: Right Arm, Patient Position: Sitting, Cuff Size: Adult)   Pulse 56   Ht 1.727 m (5\' 8" )   Wt 79.5 kg (175 lb 3.2 oz)   SpO2 96%   BMI 26.64 kg/m       Physical Exam  Vitals and nursing note reviewed.   Constitutional:       General: He is not in acute distress.     Appearance: Normal appearance.   HENT:      Head: Normocephalic.      Right Ear: Tympanic membrane normal.      Left Ear: Tympanic membrane normal.      Nose: Nose normal.       Mouth/Throat:      Mouth: Mucous membranes are moist.   Eyes:      General: No scleral icterus.     Extraocular Movements: Extraocular movements intact.      Conjunctiva/sclera: Conjunctivae normal.      Pupils: Pupils are equal, round, and reactive to light.   Neck:      Vascular: No carotid bruit.   Cardiovascular:      Rate and Rhythm: Normal rate and regular rhythm.      Pulses: Normal pulses.           Dorsalis pedis pulses are 2+ on the right side and 2+ on the left side.        Posterior tibial pulses are 2+ on the right side and 2+ on the left side.      Heart sounds: Normal heart sounds.   Pulmonary:      Effort: Pulmonary effort is normal.      Breath sounds: Normal breath sounds. No wheezing, rhonchi or rales.   Abdominal:      General: Abdomen is flat. Bowel sounds are normal.      Tenderness: There is no abdominal tenderness. There is no guarding.   Musculoskeletal:         General: No swelling.      Left shoulder: Deformity, tenderness and crepitus present. Decreased range of motion. Decreased strength.      Cervical back: Normal range of motion. No tenderness.      Right lower leg: No edema.      Left lower leg: No edema.   Skin:     General: Skin is warm.  Capillary Refill: Capillary refill takes less than 2 seconds.   Neurological:      General: No focal deficit present.      Mental Status: He is alert and oriented to person, place, and time.      Cranial Nerves: No cranial nerve deficit.              Health Maintenance:  Health Maintenance   Topic Date Due    Influenza Vaccine (1) 08/16/2023 (Originally 02/19/2023)    Medicare Annual Wellness Visit - Calendar Year Insurers  06/19/2024 (Originally 06/21/2023)    Depression Screening  10/25/2023    Fecal DNA Test (Cologuard)  03/28/2026    Adult Tdap-Td  Discontinued    Hepatitis C screening  Discontinued    Shingles Vaccine  Discontinued    Covid-19 Vaccine  Discontinued    RSV Adult 60+ or Pregnancy  Discontinued    Pneumococcal Vaccination, Age  29+  Discontinued        Assessment:    I have reviewed the most recent labs with the patient and all questions answered to patients satisfaction.       ICD-10-CM Blood Pressure today is stable and will continue current treatment plans    1. Essential hypertension  I10       2. 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 Continue current treatment plan as stable.       3. PAF (paroxysmal atrial fibrillation) (CMS HCC)  I48.0 Clinically without symptoms or evidence of recurrent disease. Continue current treatment plan as stable.       4. Coronary artery disease involving native coronary artery of native heart without angina pectoris  I25.10 Condition stable will continue current therapy.        5. OSA on CPAP  G47.33 The patient continues to use their CPAP every evening and is compliant with its use.  They continue to benefit from CPAP use and has had improve lifestyle with this.       6. Hypoxia  R09.02 Resolved         No orders of the defined types were placed in this encounter.    Medications Discontinued During This Encounter   Medication Reason    pantoprazole (PROTONIX) 40 mg Oral Tablet, Delayed Release (E.C.) Medication Reconciliation          Follow up:  Return in about 3 months (around 10/15/2023).    This note was partially created using voice recognition software and is inherently subject to errors including those of syntax and "sound alike " substitutions which may escape proof reading.  In such instances, original meaning may be extrapolated by contextual derivation.    Lucia Gaskins, DO  07/17/2023, 12:00

## 2023-07-20 ENCOUNTER — Encounter (HOSPITAL_COMMUNITY): Payer: Self-pay | Admitting: Orthopaedic Surgery

## 2023-07-26 ENCOUNTER — Encounter (HOSPITAL_COMMUNITY)
Admission: RE | Disposition: A | Discharge status: Home / Self Care | Payer: Self-pay | Source: Ambulatory Visit | Attending: Orthopaedic Surgery

## 2023-07-26 ENCOUNTER — Inpatient Hospital Stay (HOSPITAL_COMMUNITY): Payer: Medicare PPO | Admitting: Orthopaedic Surgery

## 2023-07-26 ENCOUNTER — Encounter (HOSPITAL_COMMUNITY): Payer: Self-pay | Admitting: Orthopaedic Surgery

## 2023-07-26 ENCOUNTER — Inpatient Hospital Stay (HOSPITAL_COMMUNITY): Payer: Medicare PPO

## 2023-07-26 ENCOUNTER — Inpatient Hospital Stay
Admission: RE | Admit: 2023-07-26 | Discharge: 2023-07-27 | DRG: 462 | Disposition: A | Discharge status: Home / Self Care | Payer: Medicare PPO | Source: Ambulatory Visit | Attending: Orthopaedic Surgery | Admitting: Orthopaedic Surgery

## 2023-07-26 ENCOUNTER — Other Ambulatory Visit: Payer: Self-pay

## 2023-07-26 DIAGNOSIS — G4730 Sleep apnea, unspecified: Secondary | ICD-10-CM | POA: Diagnosis present

## 2023-07-26 DIAGNOSIS — Z556 Problems related to health literacy: Secondary | ICD-10-CM

## 2023-07-26 DIAGNOSIS — K219 Gastro-esophageal reflux disease without esophagitis: Secondary | ICD-10-CM | POA: Diagnosis present

## 2023-07-26 DIAGNOSIS — Z955 Presence of coronary angioplasty implant and graft: Secondary | ICD-10-CM

## 2023-07-26 DIAGNOSIS — I252 Old myocardial infarction: Secondary | ICD-10-CM

## 2023-07-26 DIAGNOSIS — M170 Bilateral primary osteoarthritis of knee: Principal | ICD-10-CM | POA: Diagnosis present

## 2023-07-26 DIAGNOSIS — Z86718 Personal history of other venous thrombosis and embolism: Secondary | ICD-10-CM

## 2023-07-26 DIAGNOSIS — Z8249 Family history of ischemic heart disease and other diseases of the circulatory system: Secondary | ICD-10-CM

## 2023-07-26 DIAGNOSIS — Z96653 Presence of artificial knee joint, bilateral: Principal | ICD-10-CM

## 2023-07-26 DIAGNOSIS — E782 Mixed hyperlipidemia: Secondary | ICD-10-CM | POA: Diagnosis present

## 2023-07-26 DIAGNOSIS — M1990 Unspecified osteoarthritis, unspecified site: Secondary | ICD-10-CM | POA: Diagnosis present

## 2023-07-26 DIAGNOSIS — I10 Essential (primary) hypertension: Secondary | ICD-10-CM | POA: Diagnosis present

## 2023-07-26 DIAGNOSIS — I2510 Atherosclerotic heart disease of native coronary artery without angina pectoris: Secondary | ICD-10-CM | POA: Diagnosis present

## 2023-07-26 SURGERY — ARTHROPLASTY KNEE TOTAL BILATERAL
Anesthesia: General | Site: Knee | Laterality: Bilateral | Wound class: Clean Wound: Uninfected operative wounds in which no inflammation occurred

## 2023-07-26 MED ORDER — CHOLECALCIFEROL (VITAMIN D3) 25 MCG (1,000 UNIT) TABLET
1000.0000 [IU] | ORAL_TABLET | Freq: Every day | ORAL | Status: DC
Start: 2023-07-26 — End: 2023-07-27
  Administered 2023-07-26: 0 [IU] via ORAL
  Administered 2023-07-27: 1000 [IU] via ORAL
  Filled 2023-07-26: qty 1

## 2023-07-26 MED ORDER — ACETAMINOPHEN 325 MG TABLET
975.0000 mg | ORAL_TABLET | Freq: Four times a day (QID) | ORAL | Status: DC
Start: 2023-07-26 — End: 2023-07-26
  Administered 2023-07-26: 0 mg via ORAL

## 2023-07-26 MED ORDER — IPRATROPIUM 0.5 MG-ALBUTEROL 3 MG (2.5 MG BASE)/3 ML NEBULIZATION SOLN
3.0000 mL | INHALATION_SOLUTION | Freq: Once | RESPIRATORY_TRACT | Status: DC | PRN
Start: 2023-07-26 — End: 2023-07-26

## 2023-07-26 MED ORDER — METOPROLOL SUCCINATE ER 50 MG TABLET,EXTENDED RELEASE 24 HR
25.0000 mg | ORAL_TABLET | Freq: Every day | ORAL | Status: DC
Start: 2023-07-26 — End: 2023-07-27
  Administered 2023-07-26: 0 mg via ORAL
  Administered 2023-07-27: 25 mg via ORAL
  Filled 2023-07-26 (×2): qty 1

## 2023-07-26 MED ORDER — FENTANYL (PF) 50 MCG/ML INJECTION WRAPPER
50.0000 ug | INJECTION | INTRAMUSCULAR | Status: DC | PRN
Start: 2023-07-26 — End: 2023-07-26
  Administered 2023-07-26 (×3): 50 ug via INTRAVENOUS

## 2023-07-26 MED ORDER — SODIUM CHLORIDE 0.9 % (FLUSH) INJECTION SYRINGE
3.0000 mL | INJECTION | INTRAMUSCULAR | Status: DC | PRN
Start: 2023-07-26 — End: 2023-07-27

## 2023-07-26 MED ORDER — ROCURONIUM 10 MG/ML INTRAVENOUS SOLUTION
Freq: Once | INTRAVENOUS | Status: DC | PRN
Start: 2023-07-26 — End: 2023-07-26
  Administered 2023-07-26: 50 mg via INTRAVENOUS

## 2023-07-26 MED ORDER — HYDROMORPHONE (PF) 2 MG/ML INJECTION SOLUTION
0.2000 mg | INTRAMUSCULAR | Status: DC | PRN
Start: 2023-07-26 — End: 2023-07-27
  Administered 2023-07-26: 0.2 mg via INTRAVENOUS
  Filled 2023-07-26: qty 1

## 2023-07-26 MED ORDER — ETHYL ALCOHOL 62 % TOPICAL SWAB
1.0000 | Freq: Two times a day (BID) | CUTANEOUS | Status: DC
Start: 2023-07-26 — End: 2023-07-27
  Administered 2023-07-26: 1 via NASAL
  Administered 2023-07-26: 0 via NASAL
  Administered 2023-07-27: 1 via NASAL

## 2023-07-26 MED ORDER — SODIUM CHLORIDE 0.9 % INTRAVENOUS SOLUTION
INTRAVENOUS | Status: AC
Start: 2023-07-26 — End: 2023-07-27
  Administered 2023-07-26: 0 mL via INTRAVENOUS

## 2023-07-26 MED ORDER — ROPIVACAINE (PF) 2 MG/ML (0.2 %) INJECTION SOLUTION
Freq: Once | INTRAMUSCULAR | Status: DC | PRN
Start: 2023-07-26 — End: 2023-07-26
  Administered 2023-07-26: 40 mL via INTRA_ARTICULAR

## 2023-07-26 MED ORDER — DEXAMETHASONE SODIUM PHOSPHATE (PF) 10 MG/ML INJECTION SOLUTION
INTRAMUSCULAR | Status: AC
Start: 2023-07-26 — End: 2023-07-26
  Filled 2023-07-26: qty 1

## 2023-07-26 MED ORDER — LIDOCAINE (PF) 100 MG/5 ML (2 %) INTRAVENOUS SYRINGE
INJECTION | Freq: Once | INTRAVENOUS | Status: DC | PRN
Start: 2023-07-26 — End: 2023-07-26
  Administered 2023-07-26: 80 mg via INTRAVENOUS

## 2023-07-26 MED ORDER — ONDANSETRON HCL (PF) 4 MG/2 ML INJECTION SOLUTION
4.0000 mg | INTRAMUSCULAR | Status: DC | PRN
Start: 2023-07-26 — End: 2023-07-27

## 2023-07-26 MED ORDER — ACETAMINOPHEN 325 MG TABLET
650.0000 mg | ORAL_TABLET | ORAL | Status: DC | PRN
Start: 2023-07-26 — End: 2023-07-27

## 2023-07-26 MED ORDER — FAMOTIDINE (PF) 20 MG/2 ML INTRAVENOUS SOLUTION
20.0000 mg | Freq: Once | INTRAVENOUS | Status: AC
Start: 2023-07-26 — End: 2023-07-26
  Administered 2023-07-26: 20 mg via INTRAVENOUS

## 2023-07-26 MED ORDER — ONDANSETRON HCL (PF) 4 MG/2 ML INJECTION SOLUTION
INTRAMUSCULAR | Status: AC
Start: 2023-07-26 — End: 2023-07-26
  Filled 2023-07-26: qty 4

## 2023-07-26 MED ORDER — KETOROLAC 30 MG/ML (1 ML) INJECTION SOLUTION
INTRAMUSCULAR | Status: AC
Start: 2023-07-26 — End: 2023-07-26
  Filled 2023-07-26: qty 1

## 2023-07-26 MED ORDER — TRAMADOL 50 MG TABLET
50.0000 mg | ORAL_TABLET | Freq: Four times a day (QID) | ORAL | Status: DC
Start: 2023-07-26 — End: 2023-07-28
  Administered 2023-07-26 – 2023-07-27 (×4): 50 mg via ORAL
  Filled 2023-07-26 (×4): qty 1

## 2023-07-26 MED ORDER — FENTANYL (PF) 50 MCG/ML INJECTION SOLUTION
INTRAMUSCULAR | Status: AC
Start: 2023-07-26 — End: 2023-07-26
  Filled 2023-07-26: qty 2

## 2023-07-26 MED ORDER — ACETAMINOPHEN 325 MG TABLET
ORAL_TABLET | ORAL | Status: AC
Start: 2023-07-26 — End: 2023-07-26
  Filled 2023-07-26: qty 3

## 2023-07-26 MED ORDER — APREPITANT 40 MG CAPSULE
ORAL_CAPSULE | ORAL | Status: AC
Start: 2023-07-26 — End: 2023-07-26
  Filled 2023-07-26: qty 1

## 2023-07-26 MED ORDER — BISACODYL 10 MG RECTAL SUPPOSITORY
10.0000 mg | Freq: Every day | RECTAL | Status: DC | PRN
Start: 2023-07-26 — End: 2023-07-27

## 2023-07-26 MED ORDER — GABAPENTIN 300 MG CAPSULE
ORAL_CAPSULE | ORAL | Status: AC
Start: 2023-07-26 — End: 2023-07-26
  Filled 2023-07-26: qty 1

## 2023-07-26 MED ORDER — TRANEXAMIC ACID 1,000 MG/10 ML (100 MG/ML) INTRAVENOUS SOLUTION
1000.0000 mg | Freq: Once | INTRAVENOUS | Status: AC
Start: 2023-07-26 — End: 2023-07-26
  Administered 2023-07-26: 1000 mg via INTRAVENOUS

## 2023-07-26 MED ORDER — SUGAMMADEX 100 MG/ML INTRAVENOUS SOLUTION
Freq: Once | INTRAVENOUS | Status: DC | PRN
Start: 2023-07-26 — End: 2023-07-26
  Administered 2023-07-26: 300 mg via INTRAVENOUS

## 2023-07-26 MED ORDER — LISINOPRIL 5 MG TABLET
5.0000 mg | ORAL_TABLET | Freq: Every day | ORAL | Status: DC
Start: 2023-07-26 — End: 2023-07-27
  Administered 2023-07-26: 0 mg via ORAL
  Administered 2023-07-27: 5 mg via ORAL
  Filled 2023-07-26 (×2): qty 1

## 2023-07-26 MED ORDER — DEXAMETHASONE SODIUM PHOSPHATE (PF) 10 MG/ML INJECTION SOLUTION
8.0000 mg | Freq: Two times a day (BID) | INTRAMUSCULAR | Status: AC
Start: 2023-07-26 — End: 2023-07-27
  Administered 2023-07-26 – 2023-07-27 (×2): 8 mg via INTRAVENOUS
  Filled 2023-07-26 (×2): qty 1

## 2023-07-26 MED ORDER — OXYCODONE-ACETAMINOPHEN 5 MG-325 MG TABLET
1.0000 | ORAL_TABLET | ORAL | Status: DC | PRN
Start: 2023-07-26 — End: 2023-07-27

## 2023-07-26 MED ORDER — CLOPIDOGREL 75 MG TABLET
75.0000 mg | ORAL_TABLET | Freq: Every day | ORAL | Status: DC
Start: 2023-07-26 — End: 2023-07-27
  Administered 2023-07-26: 0 mg via ORAL
  Administered 2023-07-27: 75 mg via ORAL
  Filled 2023-07-26: qty 1

## 2023-07-26 MED ORDER — PROPOFOL 10 MG/ML IV BOLUS
INJECTION | Freq: Once | INTRAVENOUS | Status: DC | PRN
Start: 2023-07-26 — End: 2023-07-26
  Administered 2023-07-26: 150 mg via INTRAVENOUS

## 2023-07-26 MED ORDER — GABAPENTIN 300 MG CAPSULE
300.0000 mg | ORAL_CAPSULE | Freq: Once | ORAL | Status: AC
Start: 2023-07-26 — End: 2023-07-26
  Administered 2023-07-26: 300 mg via ORAL

## 2023-07-26 MED ORDER — FAMOTIDINE (PF) 20 MG/2 ML INTRAVENOUS SOLUTION
INTRAVENOUS | Status: AC
Start: 2023-07-26 — End: 2023-07-26
  Filled 2023-07-26: qty 2

## 2023-07-26 MED ORDER — NALOXONE 0.4 MG/ML INJECTION SOLUTION
0.4000 mg | INTRAMUSCULAR | Status: DC | PRN
Start: 2023-07-26 — End: 2023-07-27

## 2023-07-26 MED ORDER — ROPIVACAINE (PF) 2 MG/ML (0.2 %) INJECTION SOLUTION
INTRAMUSCULAR | Status: AC
Start: 2023-07-26 — End: 2023-07-26
  Filled 2023-07-26: qty 40

## 2023-07-26 MED ORDER — EPHEDRINE SULFATE 50 MG/ML INTRAVENOUS SOLUTION
Freq: Once | INTRAVENOUS | Status: DC | PRN
Start: 2023-07-26 — End: 2023-07-26
  Administered 2023-07-26: 10 mg via INTRAVENOUS
  Administered 2023-07-26 (×6): 5 mg via INTRAVENOUS

## 2023-07-26 MED ORDER — MORPHINE 2 MG/ML INJECTION WRAPPER
2.0000 mg | INJECTION | INTRAMUSCULAR | Status: DC | PRN
Start: 2023-07-26 — End: 2023-07-27

## 2023-07-26 MED ORDER — ONDANSETRON HCL (PF) 4 MG/2 ML INJECTION SOLUTION
8.0000 mg | Freq: Once | INTRAMUSCULAR | Status: AC
Start: 2023-07-26 — End: 2023-07-26
  Administered 2023-07-26: 8 mg via INTRAVENOUS

## 2023-07-26 MED ORDER — CELECOXIB 100 MG CAPSULE
200.0000 mg | ORAL_CAPSULE | Freq: Once | ORAL | Status: AC
Start: 2023-07-26 — End: 2023-07-26
  Administered 2023-07-26: 200 mg via ORAL

## 2023-07-26 MED ORDER — FENTANYL (PF) 50 MCG/ML INJECTION WRAPPER
INJECTION | Freq: Once | INTRAMUSCULAR | Status: DC | PRN
Start: 2023-07-26 — End: 2023-07-26
  Administered 2023-07-26 (×2): 25 ug via INTRAVENOUS
  Administered 2023-07-26: 50 ug via INTRAVENOUS

## 2023-07-26 MED ORDER — OXYCODONE-ACETAMINOPHEN 5 MG-325 MG TABLET
2.0000 | ORAL_TABLET | ORAL | Status: DC | PRN
Start: 2023-07-26 — End: 2023-07-27

## 2023-07-26 MED ORDER — OXYCODONE ER 10 MG TABLET,CRUSH RESISTANT,EXTENDED RELEASE 12 HR
EXTENDED_RELEASE_ORAL_TABLET | ORAL | Status: AC
Start: 2023-07-26 — End: 2023-07-26
  Filled 2023-07-26: qty 1

## 2023-07-26 MED ORDER — MULTIVITAMIN WITH FOLIC ACID 400 MCG TABLET
1.0000 | ORAL_TABLET | Freq: Every day | ORAL | Status: DC
Start: 2023-07-26 — End: 2023-07-27
  Administered 2023-07-26: 0 via ORAL
  Administered 2023-07-27: 1 via ORAL
  Filled 2023-07-26: qty 1

## 2023-07-26 MED ORDER — FUROSEMIDE 20 MG TABLET
20.0000 mg | ORAL_TABLET | Freq: Every day | ORAL | Status: DC
Start: 2023-07-26 — End: 2023-07-27
  Administered 2023-07-26: 0 mg via ORAL
  Administered 2023-07-27: 20 mg via ORAL
  Filled 2023-07-26: qty 1

## 2023-07-26 MED ORDER — TRANEXAMIC ACID 1,000 MG/10 ML (100 MG/ML) INTRAVENOUS SOLUTION
INTRAVENOUS | Status: AC
Start: 2023-07-26 — End: 2023-07-26
  Filled 2023-07-26: qty 10

## 2023-07-26 MED ORDER — DOCUSATE SODIUM 100 MG CAPSULE
100.0000 mg | ORAL_CAPSULE | Freq: Two times a day (BID) | ORAL | Status: DC
Start: 2023-07-26 — End: 2023-07-27
  Administered 2023-07-26 – 2023-07-27 (×3): 100 mg via ORAL
  Filled 2023-07-26 (×3): qty 1

## 2023-07-26 MED ORDER — PROCHLORPERAZINE EDISYLATE 10 MG/2 ML (5 MG/ML) INJECTION SOLUTION
5.0000 mg | Freq: Once | INTRAMUSCULAR | Status: DC | PRN
Start: 2023-07-26 — End: 2023-07-26

## 2023-07-26 MED ORDER — MIDAZOLAM 5 MG/ML INJECTION WRAPPER
Freq: Once | INTRAMUSCULAR | Status: DC | PRN
Start: 2023-07-26 — End: 2023-07-26
  Administered 2023-07-26 (×2): 1 mg via INTRAVENOUS

## 2023-07-26 MED ORDER — ONDANSETRON HCL (PF) 4 MG/2 ML INJECTION SOLUTION
4.0000 mg | Freq: Once | INTRAMUSCULAR | Status: DC | PRN
Start: 2023-07-26 — End: 2023-07-26

## 2023-07-26 MED ORDER — OXYCODONE ER 10 MG TABLET,CRUSH RESISTANT,EXTENDED RELEASE 12 HR
10.0000 mg | EXTENDED_RELEASE_ORAL_TABLET | Freq: Two times a day (BID) | ORAL | Status: DC
Start: 2023-07-26 — End: 2023-07-26
  Administered 2023-07-26 (×2): 10 mg via ORAL
  Filled 2023-07-26: qty 1

## 2023-07-26 MED ORDER — SODIUM CHLORIDE 0.9 % (FLUSH) INJECTION SYRINGE
3.0000 mL | INJECTION | Freq: Three times a day (TID) | INTRAMUSCULAR | Status: DC
Start: 2023-07-26 — End: 2023-07-27
  Administered 2023-07-26 – 2023-07-27 (×4): 0 mL

## 2023-07-26 MED ORDER — CEFAZOLIN 2 GRAM INTRAVENOUS SOLUTION
INTRAVENOUS | Status: AC
Start: 2023-07-26 — End: 2023-07-26
  Filled 2023-07-26: qty 14.71

## 2023-07-26 MED ORDER — KETOROLAC 30 MG/ML (1 ML) INJECTION SOLUTION
15.0000 mg | Freq: Three times a day (TID) | INTRAMUSCULAR | Status: DC
Start: 2023-07-26 — End: 2023-07-28
  Administered 2023-07-26 – 2023-07-27 (×4): 15 mg via INTRAVENOUS
  Filled 2023-07-26 (×3): qty 1

## 2023-07-26 MED ORDER — SODIUM CHLORIDE 0.9 % INTRAVENOUS PIGGYBACK
2.0000 g | Freq: Once | INTRAVENOUS | Status: AC
Start: 2023-07-26 — End: 2023-07-26
  Administered 2023-07-26: 2 g via INTRAVENOUS

## 2023-07-26 MED ORDER — LACTATED RINGERS INTRAVENOUS SOLUTION
INTRAVENOUS | Status: DC
Start: 2023-07-26 — End: 2023-07-27
  Administered 2023-07-26: 0 mL via INTRAVENOUS

## 2023-07-26 MED ORDER — ACETAMINOPHEN 325 MG TABLET
975.0000 mg | ORAL_TABLET | Freq: Once | ORAL | Status: AC
Start: 2023-07-26 — End: 2023-07-26
  Administered 2023-07-26: 975 mg via ORAL

## 2023-07-26 MED ORDER — CELECOXIB 100 MG CAPSULE
ORAL_CAPSULE | ORAL | Status: AC
Start: 2023-07-26 — End: 2023-07-26
  Filled 2023-07-26: qty 2

## 2023-07-26 MED ORDER — ACETAMINOPHEN 325 MG TABLET
975.0000 mg | ORAL_TABLET | Freq: Four times a day (QID) | ORAL | Status: AC
Start: 2023-07-26 — End: 2023-07-26
  Administered 2023-07-26 (×2): 975 mg via ORAL
  Filled 2023-07-26 (×2): qty 3

## 2023-07-26 MED ORDER — ASPIRIN 81 MG CHEWABLE TABLET
81.0000 mg | CHEWABLE_TABLET | Freq: Two times a day (BID) | ORAL | Status: DC
Start: 2023-07-27 — End: 2023-07-27
  Administered 2023-07-27: 81 mg via ORAL
  Filled 2023-07-26: qty 1

## 2023-07-26 MED ORDER — FAMOTIDINE 20 MG TABLET
20.0000 mg | ORAL_TABLET | Freq: Two times a day (BID) | ORAL | Status: DC
Start: 2023-07-26 — End: 2023-07-27
  Administered 2023-07-26: 20 mg via ORAL
  Administered 2023-07-26: 0 mg via ORAL
  Administered 2023-07-27: 20 mg via ORAL
  Filled 2023-07-26 (×2): qty 1

## 2023-07-26 MED ORDER — DEXAMETHASONE SODIUM PHOSPHATE (PF) 10 MG/ML INJECTION SOLUTION
10.0000 mg | Freq: Once | INTRAMUSCULAR | Status: AC
Start: 2023-07-26 — End: 2023-07-26
  Administered 2023-07-26: 10 mg via INTRAVENOUS

## 2023-07-26 MED ORDER — FENTANYL (PF) 50 MCG/ML INJECTION WRAPPER
25.0000 ug | INJECTION | INTRAMUSCULAR | Status: DC | PRN
Start: 2023-07-26 — End: 2023-07-26

## 2023-07-26 MED ORDER — ACETAMINOPHEN 1,000 MG/100 ML (10 MG/ML) INTRAVENOUS SOLUTION
INTRAVENOUS | Status: AC
Start: 2023-07-26 — End: 2023-07-26
  Filled 2023-07-26: qty 100

## 2023-07-26 MED ORDER — ACETAMINOPHEN 1,000 MG/100 ML (10 MG/ML) INTRAVENOUS SOLUTION
1000.0000 mg | Freq: Four times a day (QID) | INTRAVENOUS | Status: AC | PRN
Start: 2023-07-26 — End: 2023-07-27
  Administered 2023-07-26: 1000 mg via INTRAVENOUS
  Administered 2023-07-26: 0 mg via INTRAVENOUS

## 2023-07-26 MED ORDER — LACTATED RINGERS INTRAVENOUS SOLUTION
INTRAVENOUS | Status: DC
Start: 2023-07-26 — End: 2023-07-27

## 2023-07-26 MED ORDER — ALBUTEROL SULFATE 2.5 MG/3 ML (0.083 %) SOLUTION FOR NEBULIZATION
2.5000 mg | INHALATION_SOLUTION | Freq: Once | RESPIRATORY_TRACT | Status: DC | PRN
Start: 2023-07-26 — End: 2023-07-26

## 2023-07-26 MED ORDER — APREPITANT 40 MG CAPSULE
40.0000 mg | ORAL_CAPSULE | Freq: Once | ORAL | Status: AC
Start: 2023-07-26 — End: 2023-07-26
  Administered 2023-07-26: 40 mg via ORAL

## 2023-07-26 MED ORDER — LIDOCAINE HCL 4 % LARYNGOTRACHEAL SOLUTION
LARYNGEAL | Status: AC
Start: 2023-07-26 — End: 2023-07-26
  Filled 2023-07-26: qty 1

## 2023-07-26 MED ORDER — ETHYL ALCOHOL 62 % TOPICAL SWAB
1.0000 | Freq: Once | CUTANEOUS | Status: AC
Start: 2023-07-26 — End: 2023-07-26
  Administered 2023-07-26: 1 via NASAL

## 2023-07-26 MED ORDER — PHENYLEPHRINE 10 MG/ML INJECTION SOLUTION
Freq: Once | INTRAMUSCULAR | Status: DC | PRN
Start: 2023-07-26 — End: 2023-07-26
  Administered 2023-07-26: 100 ug via INTRAVENOUS

## 2023-07-26 MED ORDER — MIDAZOLAM 5 MG/ML INJECTION WRAPPER
INTRAMUSCULAR | Status: AC
Start: 2023-07-26 — End: 2023-07-26
  Filled 2023-07-26: qty 1

## 2023-07-26 SURGICAL SUPPLY — 51 items
ADH SKNCLS CYNCRLT SKINSTITCH LIQUID PREC APPL TIP NONST LF  DISP .5ML (SUTURE/WOUND CLOSURE) ×2 IMPLANT
BANDAGE ELT 5.8YDX4IN NONST SLFCLS ELAS KNIT TEAL END STCH VELCRO COTTON POLY BLND STD LGTH COMPRESS (WOUND CARE SUPPLY) ×2 IMPLANT
BANDAGE ELT 5.8YDX6IN NONST SLFCLS ELAS KNIT STRCH VELCRO COTTON POLY BLND STD LGTH COMPRESS BGE HNY (WOUND CARE SUPPLY) ×2 IMPLANT
BASEPLATE TIB TRIATH 5 KNEE TRIT (IMPLANTS KNEE) ×2 IMPLANT
BLADE 10 2 END CBNSTL SURG STRL DISP (SURGICAL CUTTING SUPPLIES) ×8 IMPLANT
BLADE SAW 90X25X1.27MM SGTL STRL LF (SURGICAL CUTTING SUPPLIES) ×2 IMPLANT
CONV USE ITEM 312576 - GOWN SURG LRG STD LGTH L3 HKLP CLSR RGLN SLEEVE TWL STRL LF  DISP GRN AERO BLU PRFRM FBRC (DRAPE/PACKS/SHEETS/OR TOWEL) ×1
COUNTER 20 CNT BLOCK ADH NEEDLE STRL LF  RD SHARP FOAM 15.75X11.5X14IN DISP (MED SURG SUPPLIES) ×1 IMPLANT
COVER EQP 90X60IN HVDTY BCK PAD FNFLD BLU STRL (DRAPE/PACKS/SHEETS/OR TOWEL) ×4 IMPLANT
CUFF TOURNIQUET RYL BLU 30X4IN COLOR CUF CYL 2 PORT 1 BLADDER QC LOW PROF 40IN STRL LF  DISP (MED SURG SUPPLIES) ×2 IMPLANT
DETERGENT INSTR 22OZ TRNSPT GEL RINSE FREE NEUT PH PREKLENZ CLR PLSNT LF (MISCELLANEOUS PT CARE ITEMS) ×1 IMPLANT
DEVICE SUCT 2 FILTER CHAMBER SCKR STRL LF  DISP (MED SURG SUPPLIES) ×1 IMPLANT
DISCONTINUED USE 162189 - BANDAGE ELT 5.8YDX6IN NONST SLFCLS ELAS KNIT STRCH VELCRO COTTON POLY BLND STD LGTH COMPRESS BGE HNY (WOUND CARE SUPPLY) ×2 IMPLANT
DRAPE 76X55IN 3 QT HALYARD LF  STRL DISP SURG (DRAPE/PACKS/SHEETS/OR TOWEL) ×2 IMPLANT
DRAPE BACK TABLE 110IN X 80IN FAN FOLD HEAVY DUTY PADDED (DRAPE/PACKS/SHEETS/OR TOWEL) ×2 IMPLANT
DRAPE BILAT LIMB 2 CIRC FENESTRATE TUBE HLDR FLUID COLLECT PCH 144X116IN STRL SURG CONTROL + SMS 88 (DRAPE/PACKS/SHEETS/OR TOWEL) ×1 IMPLANT
DRAPE INCS ANTIMIC 23X23IN IOBN2 TRNSPR (DRAPE/PACKS/SHEETS/OR TOWEL) ×3 IMPLANT
DRESS 12X4IN PS MPLX BR FOAM ACUTE SURG WOUND (WOUND CARE SUPPLY) ×2 IMPLANT
DRESS COMPRESS 13FTX6IN JNS COTTON RL HI ABS LF  STRL .5LB (WOUND CARE SUPPLY) ×2 IMPLANT
ELECTRODE ESURG BLADE PNCL 15FT VLAB EDGE TELESCP SMOKE EVAC (SURGICAL CUTTING SUPPLIES) ×1 IMPLANT
FEM 5 PA CRCTE RTN BEAD TRIATH KNEE LFT COM STRL LF (IMPLANTS KNEE) ×1 IMPLANT
FEM 5 PA CRCTE RTN BEAD TRIATH KNEE RGT COM STRL LF (IMPLANTS KNEE) ×1 IMPLANT
GLOVE SURG 6.5 LF  PF SMOOTH BEAD CUF INTLK STRL BLU 11.3IN PROTEXIS NEU-THERA PLISPRN THK7.9 MIL (GLOVES AND ACCESSORIES) ×4 IMPLANT
GLOVE SURG 7.5 LTX PF SMOOTH BEAD CUF STRL YW 12IN PROTEXIS (GLOVES AND ACCESSORIES) ×2 IMPLANT
GLOVE SURG 8 LTX PF SMOOTH BEAD CUF STRL YW 12IN PROTEXIS NEU-THERA DDRGL THK8.7 MIL (GLOVES AND ACCESSORIES) ×1 IMPLANT
GOWN SURG LRG STD LGTH L3 HKLP CLSR RGLN SLEEVE TWL STRL LF  DISP GRN AERO BLU PRFRM FBRC (DRAPE/PACKS/SHEETS/OR TOWEL) ×1 IMPLANT
GOWN SURG XL XLNG L4 REINF HKLP CLSR SET IN SLEEVE STRL LF  DISP BLU SIRUS SMS PE 56IN (DRAPE/PACKS/SHEETS/OR TOWEL) ×2 IMPLANT
GOWN SURGICAL SIRUS SMS LG XLONG 52IN LEV 4 STRL LF DISP BLUE (GLOVES AND ACCESSORIES) ×1 IMPLANT
HEMOSTAT ABS 14X2IN FLXB SHR W_V SRGCL STRL DISP (WOUND CARE SUPPLY) ×2 IMPLANT
INSERT TIB BRNG CRCTE RTN 5 9MM TRIATH X3 KNEE STRL LF (IMPLANTS KNEE) ×2 IMPLANT
LABEL MED CORRECT MED LABELING SYS 4 FLG 2 SHEET 24 PRPRNT STRL (MED SURG SUPPLIES) ×1 IMPLANT
MATTRESS TRANSF 78X34IN AIRPAL C1000LB LONG STD 2 BELT 2 HOSE ATTACHMENT PNT SNAP BUTTON LBL DISP (MED SURG SUPPLIES) ×1 IMPLANT
NEEDLE 1.5IN 18GA POLYPROP FIL LL HUB DEHP-FR STRL BLUNT BD REG WL LF  DISP RD (MED SURG SUPPLIES) ×2 IMPLANT
PATEL 10MM 32MM TRIT METAL ASYM TRIATH KNEE COM STRL (IMPLANTS KNEE) ×1 IMPLANT
PATEL 10MM 35MM TRIT METAL ASYM TRIATH KNEE COM STRL (IMPLANTS KNEE) ×1 IMPLANT
SEALER ESURG .226IN .137IN AQUAMANTYS 6 30D .236IN SPC BIPOLAR 2 ELECTRODE HMST 5.1IN STRL LF  DISP (SURGICAL CUTTING SUPPLIES) ×1 IMPLANT
SET INTPLS SUCT TUBE FAN SPRAY TIP HANDPC STRL LF  DISP (MED SURG SUPPLIES) ×1 IMPLANT
SOL IRRG 0.9% NACL 2000ML PRSV FR N-PYRG FLXB CONTAINR STRL LF (MEDICATIONS/SOLUTIONS) ×1 IMPLANT
SOL IV 0.9% NACL 1000ML STRL PRSV FR FLXB CONTAINR LF (MEDICATIONS/SOLUTIONS) ×1 IMPLANT
SOL SURG PREP 26ML DRPRP 74% ISPRP 0.7% IOD POVACRYLEX SLF CNTN APPL SKIN STRL PREOP (MED SURG SUPPLIES) ×2 IMPLANT
SPONGE LAP 18X18IN PREWASH RIGID TRY STRL LF  WHT (MED SURG SUPPLIES) ×2 IMPLANT
STAPLER SKIN 4.1X6.5MM 35 W STPL CART LF  APS U DISP CLR SS PLASTIC (WOUND CARE SUPPLY) IMPLANT
SUTURE 1 CT STRATAFIX PDS + 18IN VIOL ABS KNOTLESS TISS CONTROL SMTR ABS (SUTURE/WOUND CLOSURE) ×2 IMPLANT
SUTURE 1 CT VICRYL 36IN VIOL BRD COAT ABS (SUTURE/WOUND CLOSURE) ×8 IMPLANT
SUTURE 2-0 CT1 VICRYL 27IN UNDYED BRD COAT ABS (SUTURE/WOUND CLOSURE) ×3 IMPLANT
SUTURE 3-0 PS2 MONOCRYL MTPS 27IN UNDYED MONOF ABS (SUTURE/WOUND CLOSURE) ×2 IMPLANT
SUTURE 4-0 FS1 PROLENE 18IN BLU MONOF NONAB (SUTURE/WOUND CLOSURE) ×2 IMPLANT
SYRINGE LL 20ML STRL GRAD MED DISP (MED SURG SUPPLIES) ×2 IMPLANT
TOWEL 24X16IN COTTON BLU DISP SURG STRL LF (DRAPE/PACKS/SHEETS/OR TOWEL) ×3 IMPLANT
TRAY ~~LOC~~ ARTHROSCOPY ~~LOC~~ - ~~LOC~~ COMMUNITY HOSP (CUSTOM TRAYS & PACK) ×2 IMPLANT
WOUND IRRG IRRISEPT DBRD CLNSG 0.05% CHG SYSTEM STRL LF (WOUND CARE SUPPLY) ×2 IMPLANT

## 2023-07-26 NOTE — Anesthesia Postprocedure Evaluation (Signed)
Anesthesia Post Op Evaluation    Patient: Brandon Wells.  Procedure(s):  BILATERAL TOTAL KNEE ARTHROPLASTIES USING PRESSFIT TRIATHLON IMPLANTS    Last Vitals:Temperature: 36.8 C (98.2 F) (07/26/23 0940)  Heart Rate: 77 (07/26/23 0949)  BP (Non-Invasive): (!) 153/88 (07/26/23 0949)  Respiratory Rate: 18 (07/26/23 0949)  SpO2: 94 % (07/26/23 0949)    No notable events documented.    Patient is sufficiently recovered from the effects of anesthesia to participate in the evaluation and has returned to their pre-procedure level.  Patient location during evaluation: PACU       Patient participation: complete - patient participated  Level of consciousness: awake and alert and responsive to verbal stimuli    Pain management: adequate  Airway patency: patent    Anesthetic complications: no  Cardiovascular status: acceptable  Respiratory status: acceptable  Hydration status: acceptable  Patient post-procedure temperature: Pt Normothermic   PONV Status: Absent

## 2023-07-26 NOTE — Anesthesia Preprocedure Evaluation (Addendum)
ANESTHESIA PRE-OP EVALUATION  Planned Procedure: BILATERAL TOTAL KNEE ARTHROPLASTIES USING TRIATHLON IMPLANTS (Bilateral: Knee)  Review of Systems     anesthesia history negative     patient summary reviewed  nursing notes reviewed        Pulmonary  negative pulmonary ROS,    Cardiovascular  negative cardio ROS,   ECG reviewed ,No peripheral edema,        GI/Hepatic/Renal   negative GI/hepatic/renal ROS,         Endo/Other   neg endo/other ROS,       Neuro/Psych/MS   negative neuro/psych ROS,      Cancer    negative hematology/oncology ROS,                     Physical Assessment      Airway       Mallampati: II    TM distance: >3 FB    Neck ROM: full              Dental       Dentition intact             Pulmonary    Breath sounds clear to auscultation  (-) no rhonchi, no decreased breath sounds, no wheezes, no rales and no stridor     Cardiovascular    Rhythm: regular  Rate: Normal  (-) no friction rub, carotid bruit is not present, no peripheral edema and no murmur     Other findings              Plan  ASA 1     Planned anesthesia type: general     general anesthesia with endotracheal tube intubation  patient reason for not using neuraxial anesthesia or peripheral nerve block    PONV Plan:  I plan to administer pharmcologic prophalaxis antiemetics              Intravenous induction     Anesthesia issues/risks discussed are: Dental Injuries, Nerve Injuries, Eye /Visual Loss, PONV, Post-op Intubation/Ventilation, Post-op Cognitive Dysfunction, Post-op Pain Management, Post-op Agitation/Tantrum, Cardiac Events/MI, Stroke, Intraoperative Awareness/ Recall, Blood Loss, Aspiration, Sore Throat, Spinal Headache and Failure of Block.  Anesthetic plan and risks discussed with patient  signed consent obtained      Use of blood products discussed with who consented to blood products.      Patient's NPO status is appropriate for Anesthesia.           (Pt expresses an informed decision for General anesthesia vs Spinal vs  Epidural)

## 2023-07-26 NOTE — OR Nursing (Signed)
PRE OP MEASURE      RT AK 19 1/4"          BK  13 5/8"       LT AK 18 1/2"          BK 12 1/4"

## 2023-07-26 NOTE — PT Evaluation (Signed)
[facility]  [address]  [place], [address]  [phone]  (Fax) [phone]  Rehabilitation Services  Physical Therapy Inpatient TKA Initial Evaluation    Patient Name: Brandon Wells.  Date of Birth: Sep 15, 1950  Height: Height: 172.7 cm (5\' 8" )  Weight: Weight: 79.4 kg (175 lb)  Room/Bed: 371/A  Payor: AETNA-MEDI-ADV / Plan: AETNA MEDICARE ADVANTAGE PPO / Product Type: PPO /       PMH:  Past Medical History:   Diagnosis Date    Allergic rhinitis     Arthritis     Back problem     CPAP (continuous positive airway pressure) dependence     Esophageal reflux     Hypercholesterolemia     Hyperlipidemia     Hypertension     Myocardial infarction (CMS HCC)     Peripheral edema     Personal history of venous thrombosis and embolism     Renal calculi     Sleep apnea     Stented coronary artery     Wears glasses            Assessment:      (P) Patient is a 73 year old male admitted today s/p B TKA. Patient was agreeable to PT/OT co-evaluation upon arrival this date. Patient completed supine > sitting EOB requiring CGA and completed STS using FWW requiring CGA. Patient ambulated 170' using FWW requiring CGA and completed sitting EOB > supine with modified independence. Patient is a good candidate for skilled PT intervention during hospital admission to improve strength, ROM, balance, endurance, and independence with functional mobility.    Discharge Needs:    Equipment Recommendation: (P) front wheeled walker      The patient presents with mobility limitations due to impaired balance, impaired range of motion, impaired strength, and impaired functional activity tolerance that significantly impair/prevent patient's ability to participate in mobility-related activities of daily living (MRADLs) including  ambulation and transfers in order to safely complete, toileting, bathing, food preparation, laundering/household tasks, safely entering/exiting the home, in reasonable time.  This functional mobility deficit can be sufficiently resolved with the use of a (P) front wheeled walker  in order to decrease the risk of falls, morbidity, and mortality in performance of these MRADLs.  Patient is able to safely use this assistive device.    Discharge Disposition: (P) home with assist, home with outpatient services    JUSTIFICATION OF DISCHARGE RECOMMENDATION   Based on current diagnosis, functional performance prior to admission, and current functional performance, this patient requires continued PT services in (P) home with assist, home with outpatient services in order to achieve significant functional improvements in these deficit areas: (P) aerobic capacity/endurance, gait, locomotion, and balance, muscle performance, posture, ROM (range of motion).        Plan:   Current Intervention: (P) balance training, bed mobility training, home exercise program, gait training, patient/family education, postural re-education, ROM (range of motion), stair training, strengthening, stretching, transfer training  To provide physical therapy services (P) other (see comments) (1-3x/day Monday to Saturday.)  for duration of (P) until discharge.    The risks/benefits of therapy have been discussed with the patient/caregiver and he/she is in agreement with the established plan of care.       Subjective & Objective     Past Medical History:   Diagnosis Date    Allergic rhinitis     Arthritis     Back problem     CPAP (  continuous positive airway pressure) dependence     Esophageal reflux     Hypercholesterolemia     Hyperlipidemia     Hypertension     Myocardial infarction (CMS HCC)     Peripheral edema     Personal history of venous thrombosis and embolism     Renal calculi     Sleep apnea     Stented coronary artery     Wears glasses             Past Surgical History:   Procedure Laterality Date    ACHILLES TENDON REPAIR Left     CATARACT EXTRACTION Bilateral     CYSTOSCOPY W/ RETROGRADES      HX CORONARY STENT  PLACEMENT      HX HERNIA REPAIR Bilateral     inguinal    HX HIP REPLACEMENT Left 09/28/2022             07/26/23 1400   Rehab Session   Document Type evaluation   PT Visit Date 07/26/23   General Information   Patient Profile Reviewed yes   Pertinent History of Current Functional Problem Patient is a 73 year old male admitted today s/p B TKA. Patient has a PMH of arthritis, CPAP dependence, HLD, HTN, MI, and peripheral edema. PT was consulted for post-operative evaluation.   Medical Lines PIV Line   Respiratory Status nasal cannula  (Pt receiving 1L of O2 via nasal cannula)   Existing Precautions/Restrictions no known precautions/limitations   Mutuality/Individual Preferences   Anxieties, Fears or Concerns none voiced   Individualized Care Needs assistance with ADLs, monitoring of vitals   Patient-Specific Goals (Include Timeframe) d/c tomorrow   Plan of Care Reviewed With patient   Living Environment   Lives With spouse   Living Arrangements house   Home Assessment: No Problems Identified   Home Accessibility stairs to enter home;stairs within home   Stairs Within Home, Primary   Number of Stairs, Within Home, Primary other (see comments)  (13)   Stair Railings, Within Home, Primary railing on right side (ascending)   Home Main Entrance   Number of Stairs, Main Entrance one   Functional Level Prior   Ambulation 0 - independent   Transferring 0 - independent   Toileting 0 - independent   Bathing 0 - independent   Dressing 0 - independent   Eating 0 - independent   Communication 0 - understands/communicates without difficulty   Swallowing 0-->swallows foods/liquids without difficulty   Prior Functional Level Comment Patient reports being independent with ADLs prior to admission and denies AD use.   Pre Treatment Status   Pre Treatment Patient Status Patient supine in bed;Call light within reach;Patient safety alarm activated   Support Present Pre Treatment  Family present   Communication Pre Treatment  Nurse    Communication Pre Treatment Comment Nurse cleared pt for PT eval   Cognitive Assessment/Interventions   Behavior/Mood Observations behavior appropriate to situation, WNL/WFL   Orientation Status oriented x 4   Attention WNL/WFL   Follows Commands WFL   Pre- Treatment Vital Signs   Pre-Treatment Heart Rate (beats/min) 96   Pre SpO2 (%) 98   O2 Delivery Pre Treatment room air   Pre-Treatment Pain   Pretreatment Pain Rating 8/10   Pre/Posttreatment Pain Comment B knees   Trunk Assessment   Trunk Assessment WFL-Within Functional Limits   Mobility Assessment/Training   Additional Documentation Bed Mobility Assessment/Treatment (Group);Transfer Assessment/Treatment (Group);Gait Assessment/Treatment (Group)   Bed Mobility  Bed Mobility, Assistive Device bed rails;Head of Bed Elevated   Scoot/Bridge Independence modified independence   Sit to Supine, Independence modified independence   Supine-Sit Independence contact guard assist   Transfer Assessment/Treatment   Sit-Stand-Sit, Assist Device walker, front wheeled   Sit-Stand Independence contact guard assist   Stand-Sit Independence contact guard assist   Gait Assessment/Treatment   Total Distance Ambulated 170   Gait Speed decreased   Deviations  narrow BOS;step length decreased;stride length decreased   Assistive Device  walker, front wheeled   Distance in Feet 170   Independence  contact guard assist   Post Treatment Status   Post Treatment Patient Status Patient supine in bed;Call light within reach;Patient safety alarm activated   Support Present Post Treatment  Family present   Communication Post Treatement Nurse   Patient Effort excellent   Post-Treatment Vital Signs   Post-treatment Heart Rate (beats/min) 92   Post SpO2 (%) 92   O2 Delivery Post Treatment supplemental O2  (PT redonned 1L of O2 d/t patient desating to 86% at end of evaluation, nurse notified.)   Post-Treatment Pain   Posttreatment Pain Rating 8/10   Physical Therapy Clinical Impression    Assessment Patient is a 73 year old male admitted today s/p B TKA. Patient was agreeable to PT/OT co-evaluation upon arrival this date. Patient completed supine > sitting EOB requiring CGA and completed STS using FWW requiring CGA. Patient ambulated 170' using FWW requiring CGA and completed sitting EOB > supine with modified independence. Patient is a good candidate for skilled PT intervention during hospital admission to improve strength, ROM, balance, endurance, and independence with functional mobility.   Criteria for Skilled Therapeutic yes   Impairments Found (describe specific impairments) aerobic capacity/endurance;gait, locomotion, and balance;muscle performance;posture;ROM (range of motion)   Functional Limitations in Following  self-care;home management;community/leisure   Rehab Potential good   Therapy Frequency other (see comments)  (1-3x/day Monday to Saturday.)   Predicted Duration of Therapy Intervention (days/wks) until discharge   Anticipated Equipment Needs at Discharge (PT) front wheeled walker   Anticipated Discharge Disposition home with assist;home with outpatient services   Evaluation Complexity Justification   Patient History: Co-morbidity/factors that impact Plan of Care 3 or more that impact Plan of Care   Examination Components 3 or more Exam elements addressed   Presentation Evolving: Symptoms, complaints, characteristics of condition changing &/or cognitive deficits present   Clinical Decision Making Moderate complexity   Evaluation Complexity Moderate complexity   Planned Therapy Interventions, PT Eval   Planned Therapy Interventions (PT) balance training;bed mobility training;home exercise program;gait training;patient/family education;postural re-education;ROM (range of motion);stair training;strengthening;stretching;transfer training   Physical Therapy Time and Intention   Total PT Minutes: 14       TOTAL KNEE ARTHROPLASTY (TKA) PHYSICAL THERAPY GOALS    1) AMBULATE 300 FEET WITH  WALKER AND CGA.   2) TRANSFER BED TO CHAIR WITH CGA.   3) INCREASED STRENGTH TO 3+/5 KNEE EXTENSION.   4) INCREASED KNEE AAROM TO 4 TOP 90 DEGREES.  5) NEGOTIATES TRAINING STEPS USING HANDRAIL AND CGA.       Physical Therapy Inpatient TKA D/C Criteria        PATIENT/CAREGIVER EDUCATION  Educated on exercise program? NO  Can successfully demonstrate exercise form? NO  Can demonstrate safe/effective assistance with functional mobility? YES     IMPAIRMENT BASED CRITERIA  Does the patient demonstrate knee ROM of at least 8-75 degrees on the involved leg? NO  Does the patient demonstrate a minimum  of 3+/5 quadricep strength? NO  Does the patient demonstrate the ability to maintain any weight bearing precautions? YES  Does the patient demonstrated sensation of all LE dermatomes? NO  Does the patient have adequate pain control of <4/10 for functional mobility at home? NO    FUNCTIONAL BASED CRITERIA  Does that patient demonstrate the ability to ambulate 80 feet with RW using CGA/SPV assistance? YES  Does the patient demonstrate the ability to perform bed mobility with no more than min assist? YES  Does the patient demonstrated the ability to sit to stand from the bed or chair with min assist? YES  Does the patient demonstrate the ability to walk up/down 3-5 stairs with min assist as required for home entry? NO    ROM  Extension : not assessed.  Flexion : not assessed.        INTERVENTION MINUTES: EVALUATION 14 minutes    EVALUATION COMPLEXITY : CLINICAL DECISION MAKING OF MODERATE COMPLEXITY AS INDICATED BY PMHX, PHYSICAL THERAPY ASSESSMENT OF MUSCULOSKELETAL AND NEUROLOGICAL SYSTEMS AND ACTIVITY LIMITATIONS. CLINICAL PRESENTATION HAS CHANGING CHARACTERISTICS.    Therapist:     Phineas Douglas, PT  07/26/2023, 15:11

## 2023-07-26 NOTE — Interval H&P Note (Signed)
 H & P updated the day of the procedure.  1.  H&P completed within 30 days of surgical procedure and has been reviewed within 24 hours of admission but prior to surgery or a procedure requiring anesthesia services, the patient has been examined, and no change has occured in the patients condition since the H&P was completed.       Change in medications: No        No LMP recorded.      Comments:     2.  Patient continues to be appropriate candidate for planned surgical procedure. YES    Quenton Fetter, DO

## 2023-07-26 NOTE — Care Plan (Signed)
Problem: Adult Inpatient Plan of Care  Goal: Patient-Specific Goal (Individualized)  Outcome: Ongoing (see interventions/notes)  Flowsheets (Taken 07/26/2023 1818)  Individualized Care Needs: ADLs, PT, pain control.  Anxieties, Fears or Concerns: Anxious about being in the hospital.   Patient continues to receive PRN and scheduled medications, pain medication as needed, IV fluids, physical therapy, fall prevention interventions to promote safety, TCDB and use of incentive spirometry to promote lung expansion and oxygenation, patient education provided regarding fall risk prevention and recent surgery, and discharge planning remains ongoing.

## 2023-07-26 NOTE — Anesthesia Transfer of Care (Signed)
ANESTHESIA TRANSFER OF CARE   Brandon Wells. is a 73 y.o. ,male, Weight: 79.4 kg (175 lb)   had Procedure(s):  BILATERAL TOTAL KNEE ARTHROPLASTIES USING PRESSFIT TRIATHLON IMPLANTS  performed  07/26/23   Primary Service: Quenton Fetter, DO    Past Medical History:   Diagnosis Date   . Allergic rhinitis    . Arthritis    . Back problem    . CPAP (continuous positive airway pressure) dependence    . Esophageal reflux    . Hypercholesterolemia    . Hyperlipidemia    . Hypertension    . Myocardial infarction (CMS HCC)    . Peripheral edema    . Personal history of venous thrombosis and embolism    . Renal calculi    . Sleep apnea    . Stented coronary artery    . Wears glasses       Allergy History as of 07/26/23       PREDNISONE         Noted Status Severity Type Reaction    09/09/21 0619 Duffy, Misty Stanley, CPhT 07/29/21 Active    Other Adverse Reaction (Add comment)    Comments: Insomnia               HYDROCODONE         Noted Status Severity Type Reaction    10/24/22 1738 Hardie Pulley, RN 10/24/22 Active Medium  Rash                  I completed my transfer of care / handoff to the receiving personnel during which we discussed:  Access, Airway, All key/critical aspects of case discussed, Analgesia, Antibiotics, Expectation of post procedure, Fluids/Product, Gave opportunity for questions and acknowledgement of understanding, Labs and PMHx  Report given to: Margret Chance, RN    Post Location: PACU                                                           Last OR Temp: Temperature: 36.8 C (98.2 F)  ABG:  POTASSIUM   Date Value Ref Range Status   06/28/2023 4.4 3.5 - 5.1 mmol/L Final     KETONES   Date Value Ref Range Status   06/28/2023 Negative Negative, Trace mg/dL Final     CALCIUM   Date Value Ref Range Status   06/28/2023 9.6 8.6 - 10.3 mg/dL Final     Calculated P Axis   Date Value Ref Range Status   06/28/2023 65 degrees Final     Calculated R Axis   Date Value Ref Range Status   06/28/2023 105 degrees Final      Calculated T Axis   Date Value Ref Range Status   06/28/2023 -52 degrees Final     CANNAQL   Date Value Ref Range Status   10/24/2022 Negative Negative Final     BENZO QL   Date Value Ref Range Status   10/24/2022 Negative Negative Final     Airway:* No LDAs found *  Blood pressure (!) 147/86, pulse 74, temperature 36.8 C (98.2 F), resp. rate 12, height 1.727 m (5\' 8" ), weight 79.4 kg (175 lb), SpO2 100%.

## 2023-07-26 NOTE — Nurses Notes (Signed)
Patient transported to room 371. Patient's vitals: 97.28F temp, 82 HR, 18 RR, 172/102 BP, 98% on 2 liters NC. Care endorsed to Charlena Cross, RN at bedside.   Marland Kitchen

## 2023-07-26 NOTE — H&P (Signed)
 Paper H and P on chart, will be scanned to EMR.

## 2023-07-26 NOTE — OR Surgeon (Signed)
[facility]   OPERATIVE REPORT  PATIENT NAME:  Brandon Wells, Brandon Wells.  MRN:  [id number]  DOB:  [birth year]      DATE OF PROCEDURE:  07/26/2023   PRE OP DIAGNOSIS:  Osteoarthritis of the knees bilaterally  POST OP DIAGNOSIS: Same   PROCEDURE: Bilateral total knee replacement  COMPONENTS:    Implant Name Type Inv. Item Serial No. Manufacturer Lot No. LRB No. Used Action   FEM 5 PA CRCTE RTN BEAD TRIATH KNEE LFT COM STRL LF - [id number]  FEM 5 PA CRCTE RTN BEAD TRIATH KNEE LFT COM STRL LF  [employer] [id number]  1 Implanted   INSERT TIB BRNG CRCTE RTN 5 TRIATH X3 KNEE STRL LF - [id number]  INSERT TIB BRNG CRCTE RTN 5 TRIATH X3 KNEE STRL LF  [employer] [id number]  1 Implanted   BASEPLATE TIB TRIATH 5 KNEE TRIT - [id number]  BASEPLATE TIB TRIATH 5 KNEE TRIT  [employer] [id number]  1 Implanted   PATEL  TRIT METAL ASYM TRIATH KNEE COM STRL - [id number]  PATEL  TRIT METAL ASYM TRIATH KNEE COM STRL  [employer] [id number]  1 Implanted   FEM 5 PA CRCTE RTN BEAD TRIATH KNEE RGT COM STRL LF - [id number]  FEM 5 PA CRCTE RTN BEAD TRIATH KNEE RGT COM STRL LF  [employer] [id number] Right 1 Implanted   INSERT TIB BRNG CRCTE RTN 5 TRIATH X3 KNEE STRL LF - [id number]  INSERT TIB BRNG CRCTE RTN 5 TRIATH X3 KNEE STRL LF  [employer] [id number] Right 1 Implanted   BASEPLATE TIB TRIATH 5 KNEE TRIT - [id number]  BASEPLATE TIB TRIATH 5 KNEE TRIT  [employer] [id number] Right 1 Implanted   PATEL  TRIT METAL ASYM TRIATH KNEE COM STRL - [id number]  PATEL  TRIT METAL ASYM TRIATH KNEE COM STRL  [employer] [id number] Right 1 Implanted        MANUFACTURER: [employer]  ANESTHESIA TYPE:  PBanesthesia1: Spinal    SURGEON RIGHT:   Henriette Combs DO    ASSISTING SURGEON RIGHT: Quenton Fetter DO  FIRST ASSIST:  Almyra Free  FA Function: Superficial closure   SURGEON LEFT: Cori Razor MD  ASSISTING SURGEON LEFT: Henriette Combs DO  FIRST ASSISTANT:  PBfirstassist: Lavonia Drafts  ANTIBIOTIC:  Kefzol   ANTICOAGULANT:  ASA   COMPLICATIONS: None  Specimens: * No specimens in log *   DESCRIPTION OF THE PROCEDURE RIGHT:  Patient identified and time out completed with entire surgical team. Antibiotics and tranexamic acid administered prior to inflation of toruniquet.  Satisfactory anesthesia induced.  Tourniquet was placed over padding on the thigh.  Both legs were prepped with DuraPrep and draped in a sterile manner.  Elevation, exsanguination, and tourniquet to 350 mmHg.     Procedure was performed with 2 separate operating teams working simultaneously.  Start time staggered between the knees to allow for the contralateral surgeon to assist, particularly at critical stages of the procedure establishing final component alignment and rotation.  Anterior approach to the knee through medial parapatellar arthrotomy.  Intramedullary femoral cutting guide, 4 degrees valgus, 8 mm resection, distal cut performed.  Sizing guide in neutral rotation, component chosen.  Anterior, posterior, and chamfer cuts performed.  ACL parted sharply.  Tibia subluxed anteriorly.  Medial release performed.  Extramedullary tibial cutting guide referencing 9 mm from the high side, perpendicular with anatomic axis, 3 degrees posterior slope.  Resection performed.  Marginal osteophytes  removed from the tibia, posterior osteophytes from the femur, notch debrided.  A baseplate trial was positioned with an insert and femur.  Full extension and flexion achieved with good rotation, alignment, and stability.  The four auxiliary holes drilled in the tibia.  Patellar articular surface resected; component chosen.  Drill holes performed.  Trial placed with nice tracking and good medial contact.  All trials removed.  Irrigation followed.  The baseplate was impacted into position with appropriate insert snapped into place.  The femoral component was then impacted and the patella compressed into position.  The knee was taken  through range of motion and stable.  All components with nice bony contact.  Wound was irrigated copiously with Irrisept.  The arthrotomy was closed with #1 Vicryl in interrupted fashion, subcutaneous tissue, and skin in layers.  Sterile Jones bandage was applied.  Tourniquet was released.  The patient was brought to recovery room in stable condition.  DESCRIPTION OF THE PROCEDURE LEFT:  Patient identified and time out completed with entire surgical team. Antibiotics and tranexamic acid administered prior to inflation of toruniquet.  Satisfactory anesthesia induced.  Tourniquet was placed over padding on the thigh.  Both legs were prepped with DuraPrep and draped in a sterile manner.  Elevation, exsanguination, and tourniquet to 350 mmHg.     Anterior approach to the knee through medial parapatellar arthrotomy.  Intramedullary femoral cutting guide, 4 degrees valgus, 8 mm resection, distal cut performed.  Sizing guide in neutral rotation, component chosen.  Anterior, posterior, and chamfer cuts performed.  ACL parted sharply.  Tibia subluxed anteriorly.  Medial release performed.  Extramedullary tibial cutting guide referencing 9 mm from the high side, perpendicular with anatomic axis, 3 degrees posterior slope.  Resection performed.  Marginal osteophytes removed from the tibia, posterior osteophytes from the femur, notch debrided.  A baseplate trial was positioned with an insert and femur.  Full extension and flexion achieved with good rotation, alignment, and stability.  The four auxiliary holes drilled in the tibia.  Patellar articular surface resected; component chosen.  Drill holes performed.  Trial placed with nice tracking and good medial contact.  All trials removed.  Irrigation followed.  The baseplate was impacted into position with appropriate insert snapped into place.  The femoral component was then impacted and the patella compressed into position.  The knee was taken through range of motion and  stable.  All components with nice bony contact.  Wound was irrigated copiously with Irrisept.  The arthrotomy was closed with #1 Vicryl in interrupted fashion, subcutaneous tissue, and skin in layers.  Sterile Jones bandage was applied.  Tourniquet was released.  The patient was brought to recovery room in stable condition.  Quenton Fetter, DO  07/26/2023, 09:51   This note was partially generated using MModal Fluency Direct system, and there may be some incorrect words, spellings, and punctuation that were not noted in checking the note before saving.

## 2023-07-26 NOTE — OT Evaluation (Signed)
[facility]  [address]  [place], [address]  [phone]  (Fax) [phone]  Rehabilitation Services  Occupational Therapy Inpatient Initial Evaluation      Patient Name: Brandon Wells.  Date of Birth: [birth year]  Height: Height: 172.7 cm (5\' 8" )  Weight: Weight: 79.4 kg (175 lb)  Room/Bed: 371/A  Payor: AETNA-MEDI-ADV / Plan: AETNA MEDICARE ADVANTAGE PPO / Product Type: PPO /         PMH:   Past Medical History:   Diagnosis Date    Allergic rhinitis     Arthritis     Back problem     CPAP (continuous positive airway pressure) dependence     Esophageal reflux     Hypercholesterolemia     Hyperlipidemia     Hypertension     Myocardial infarction (CMS HCC)     Peripheral edema     Personal history of venous thrombosis and embolism     Renal calculi     Sleep apnea     Stented coronary artery     Wears glasses            Assessment:      Functional Level at Time of Session: (P) Patient is a pleasant 73 year old male admitted for an elective bilateral TKA. Prior to admission, he was independent in ADLs, IADLs and functional mobility. During OT/PT eval, patient was alert, oriented x4, cooperative and able to follow multistep commands. He has full UB AROM and good (4/5) UB strength. Patient engaged in bed mobility, transfers and ambulation with PT. While on EOB, patient required max A with dressing due to lightheadedness. He required SBA with toileting while standing at EOB. OT to follow up during acute stay for ADL training and functional transfer training.    Discharge Needs:   Equipment Recommendation:  TBD    The patient presents with mobility limitations due to impaired range of motion and weight bearing restrictions that significantly impair/prevent patient's ability to participate in mobility-related activities of daily living (MRADLs) including  ambulation and transfers in order to safely complete, toileting, bathing, safely entering/exiting the home, in  reasonable time. This functional mobility deficit can be sufficiently resolved with the use of a Anticipated Equipment Needs at Discharge: (P) to be determined  in order to decrease the risk of falls, morbidity, and mortality in performance of these MRADLs.  Patient is able to safely use this assistive device.    Discharge Disposition:  home with assist    JUSTIFICATION OF DISCHARGE RECOMMENDATION   Based on current diagnosis, functional performance prior to admission, and current functional performance, this patient requires continued OT services in Anticipated Discharge Disposition: (P) home with assist  in order to achieve significant functional improvements.    Plan:   Current Intervention:  Predicted Duration of Therapy: (P) until discharge    To provide Occupational therapy services  Therapy Frequency: (P) minimum of 1x/week for duration of Predicted Duration of Therapy: (P) until discharge  .       The risks/benefits of therapy have been discussed with the patient/caregiver and he/she is in agreement with the established plan of care.       Subjective & Objective     MEDICAL HISTORY:   Past Medical History:   Diagnosis Date    Allergic rhinitis     Arthritis     Back problem     CPAP (continuous positive airway pressure) dependence  Esophageal reflux     Hypercholesterolemia     Hyperlipidemia     Hypertension     Myocardial infarction (CMS HCC)     Peripheral edema     Personal history of venous thrombosis and embolism     Renal calculi     Sleep apnea     Stented coronary artery     Wears glasses          SURGICAL HISTORY:   Past Surgical History:   Procedure Laterality Date    ACHILLES TENDON REPAIR Left     CATARACT EXTRACTION Bilateral     CYSTOSCOPY W/ RETROGRADES      HX CORONARY STENT PLACEMENT      HX HERNIA REPAIR Bilateral     inguinal    HX HIP REPLACEMENT Left 09/28/2022       ipp    INSERT FLOW SHEET     07/26/23 1359   Rehab Session   Document Type evaluation   OT Visit Date 07/26/23   Total  OT Minutes: 22  (OT/PT co-eval)   Patient Effort good   Symptoms Noted During/After Treatment dizziness   General Information   Patient Profile Reviewed yes   Medical Lines PIV Line   Respiratory Status room air   Pre Treatment Status   Pre Treatment Patient Status Patient supine in bed;Call light within reach;Telephone within reach;Patient safety alarm activated;Nurse approved session   Support Present Pre Treatment  Family present   Mutuality/Individual Preferences   Anxieties, Fears or Concerns None voiced   Living Environment   Lives With spouse   Living Arrangements house   Home Accessibility stairs to enter home;stairs within home;tub/shower is not walk in   Living Environment Comment He has a tub, tub bench and BSC   Home Main Entrance   Number of Stairs, Main Entrance one   Stairs Within Home, Primary   Number of Stairs, Within Home, Primary other (see comments)  (13)   Functional Level Prior   Ambulation 0 - independent   Transferring 0 - independent   Toileting 0 - independent   Bathing 0 - independent   Dressing 0 - independent   Eating 0 - independent   Communication 0 - understands/communicates without difficulty   Swallowing 0-->swallows foods/liquids without difficulty   Self-Care   Dominant Hand right   Vital Signs   Pre-Treatment Heart Rate (beats/min) 99   Post-treatment Heart Rate (beats/min) 96   Pre SpO2 (%) 96   O2 Delivery Pre Treatment supplemental O2   Post SpO2 (%) 92   O2 Delivery Post Treatment room air   Coping/Psychosocial   Observed Emotional State calm;cooperative   Verbalized Emotional State acceptance   Family/Support System   Family/Support Persons spouse   Involvement in Care at bedside;attentive to patient;interacting with patient;participating in care   Coping/Psychosocial Response Interventions   Plan Of Care Reviewed With patient   Cognition   Behavior/Mood Observations behavior appropriate to situation, WNL/WFL   Orientation Status oriented x 4   Attention WNL/WFL   Follows  Commands WFL   Vision Assessment/Interventions   Visual Impairment/Limitations WFL   RUE Assessment   RUE Assessment WFL- Within Functional Limits   LUE Assessment   LUE Assessment WFL- Within Functional Limits   Grip Strength   Grip Left (4/5) good, left   Right Grip (4/5) good, right   Lower Body Dressing Assessment/Training   Position sitting   DRESSING ASSESSED Doff Socks   Independence Level  maximum assist (  25% patient effort)   Impairments other (see comments)  (lightheadedness)   Toileting Assessment/Training   Assistive Devices urinal   Position standing   Independence Level  standby assist   Post Treatment Status   Post Treatment Patient Status Patient supine in bed;Call light within reach;Telephone within reach;Patient safety alarm activated   Support Present Post Treatment  Family present   Care Plan Goals   OT Rehab Goals LB Dressing Goal;Toileting Goal   LB Dressing Goal   LB Dressing Goal, Date Established 07/26/23   LB Dressing Goal, Time to Achieve by discharge   LB Dressing Goal, Activity Type all lower body dressing tasks   LB Dressing Goal, Independence Level stand-by assistance   Toileting Goal   Toileting Goal, Date Established 07/26/23   Toileting Goal, Time to Achieve by discharge   Toileting Goal, Activity Type all toileting tasks   Toileting Goal, Independence Level modified independence   Planned Therapy Interventions, OT Eval   Planned Therapy Interventions ADL retraining   Clinical Impression   Functional Level at Time of Session Patient is a pleasant 72 year old male admitted for an elective bilateral TKA. Prior to admission, he was independent in ADLs, IADLs and functional mobility. During OT/PT eval, patient was alert, oriented x4, cooperative and able to follow multistep commands. He has full UB AROM and good (4/5) UB strength. Patient engaged in bed mobility, transfers and ambulation with PT. While on EOB, patient required max A with dressing due to lightheadedness. He required SBA  with toileting while standing at EOB. OT to follow up during acute stay for ADL training and functional transfer training.   Criteria for Skilled Therapeutic Interventions Met (OT) yes;meets criteria;skilled treatment is necessary   Rehab Potential good   Therapy Frequency minimum of 1x/week   Predicted Duration of Therapy until discharge   Anticipated Equipment Needs at Discharge to be determined   Anticipated Discharge Disposition home with assist   Evaluation Complexity Justification   Occupational Profile Review Brief history   Performance Deficits 1-3 deficits   Clinical Decision Making Low analytic complexity   Evaluation Complexity Low       TREATMENT PLAN: ADL/IADL TRAINING  EVALUATION COMPLEXITY: CLINICAL DECISION MAKING OF LOW COMPLEXITY AS INDICATED BY PMH, OCCUPATIONAL THERAPY ASSESSMENT OF MUSCULOSKELETAL AND NEUROLOGICAL SYSTEMS AND ACTIVITY LIMITATIONS. CLINICAL PRESENTATION IS STABLE AND UNCOMPLICATED.      EVALUATION 8 minutes    Therapist:      Hildred Laser, OT,07/26/2023 14:43

## 2023-07-27 ENCOUNTER — Telehealth (INDEPENDENT_AMBULATORY_CARE_PROVIDER_SITE_OTHER): Payer: Self-pay | Admitting: Internal Medicine

## 2023-07-27 LAB — MANUAL DIFFERENTIAL
BAND %: 8 % (ref 5–11)
BANDS NEUTROPHILS MANUAL: 8
LYMPHOCYTE %: 4 % — ABNORMAL LOW (ref 25–45)
LYMPHOCYTE ABSOLUTE: 0.38 10*3/uL — ABNORMAL LOW (ref 1.10–5.00)
LYMPHOCYTES MANUAL: 4
MONOCYTE %: 4 % (ref 0–12)
MONOCYTE ABSOLUTE: 0.38 10*3/uL (ref 0.00–1.30)
MONOCYTES MANUAL: 4
NEUTROPHIL %: 84 % — ABNORMAL HIGH (ref 40–76)
NEUTROPHIL ABSOLUTE: 8.74 10*3/uL — ABNORMAL HIGH (ref 1.80–8.40)
NEUTROPHILS MANUAL: 84
PLATELET MORPHOLOGY COMMENT: NORMAL
TOTAL CELLS COUNTED [#] IN BLOOD: 100
WBC: 9.5 10*3/uL

## 2023-07-27 LAB — CBC WITH DIFF
HCT: 34.1 % — ABNORMAL LOW (ref 36.7–47.1)
HGB: 11.7 g/dL — ABNORMAL LOW (ref 12.5–16.3)
MCH: 31.9 pg (ref 23.8–33.4)
MCHC: 34.2 g/dL (ref 32.5–36.3)
MCV: 93.3 fL (ref 73.0–96.2)
MPV: 8.4 fL (ref 7.4–11.4)
PLATELETS: 115 10*3/uL — ABNORMAL LOW (ref 140–440)
RBC: 3.66 10*6/uL — ABNORMAL LOW (ref 4.06–5.63)
RDW: 13.2 % (ref 12.1–16.2)
WBC: 9.5 10*3/uL (ref 3.6–10.2)

## 2023-07-27 LAB — BASIC METABOLIC PANEL
ANION GAP: 5 mmol/L (ref 4–13)
BUN/CREA RATIO: 22 (ref 6–22)
BUN: 29 mg/dL — ABNORMAL HIGH (ref 7–25)
CALCIUM: 8.1 mg/dL — ABNORMAL LOW (ref 8.6–10.3)
CHLORIDE: 104 mmol/L (ref 98–107)
CO2 TOTAL: 26 mmol/L (ref 21–31)
CREATININE: 1.34 mg/dL — ABNORMAL HIGH (ref 0.60–1.30)
ESTIMATED GFR: 57 mL/min/{1.73_m2} — ABNORMAL LOW (ref >59)
GLUCOSE: 282 mg/dL — ABNORMAL HIGH (ref 74–109)
OSMOLALITY, CALCULATED: 286 mosm/kg (ref 270–290)
POTASSIUM: 4.6 mmol/L (ref 3.5–5.1)
SODIUM: 135 mmol/L — ABNORMAL LOW (ref 136–145)

## 2023-07-27 MED ORDER — ASPIRIN 81 MG CHEWABLE TABLET
81.0000 mg | CHEWABLE_TABLET | Freq: Two times a day (BID) | ORAL | 0 refills | Status: AC
Start: 2023-07-27 — End: 2023-08-26

## 2023-07-27 MED ORDER — HYDROCODONE 7.5 MG-ACETAMINOPHEN 325 MG TABLET
1.0000 | ORAL_TABLET | ORAL | 0 refills | Status: DC | PRN
Start: 2023-07-27 — End: 2023-10-19

## 2023-07-27 NOTE — Care Management Notes (Signed)
[facility]  Care Management Initial Evaluation    Patient Name: Brandon Wells.  Date of Birth: 03-21-1951  Sex: male  Date/Time of Admission: 07/26/2023  5:39 AM  Room/Bed: 371/A  Payor: AETNA-MEDI-ADV / Plan: AETNA MEDICARE ADVANTAGE PPO / Product Type: PPO /   Primary Care Providers:  Louis Matte, DO (General)    Pharmacy Info:   Preferred Pharmacy       [facility] [phone] [place], [place] - [address] AT [facility]    [address] [place] [address]    Phone: [phone] Fax: [phone]    Hours: Not open 24 hours          Emergency Contact Info:   Extended Emergency Contact Information  Primary Emergency Contact: Cudmore,[name]  Mobile Phone: [phone]  Relation: Wife  Preferred language: English  Interpreter needed? No    History:   Rody Keadle. is a 73 y.o., male, admitted 07/26/23.    Height/Weight: 172.7 cm (5\' 8" ) / 79.4 kg (175 lb)     LOS: 1 day   Admitting Diagnosis: Osteoarthritis [M19.90]    Assessment:      07/27/23 1109   Assessment Details   Assessment Type Admission   Date of Care Management Update 07/27/23   Readmission   Is this a readmission? No   Insurance Information/Type   Insurance type Medicare   Employment/Financial   Patient has Prescription Coverage?  Yes        Name of Insurance Coverage for Medications A[employer]na [employer]   Financial/Environmental Concerns none   Living Environment   Select an age group to open "lives with" row.  Adult   Lives With spouse   Living Arrangements house   Able to Return to Prior Arrangements yes   IEP and/or 504 Plan? No   Home Safety   Home Assessment: Stairs in Home   Home Accessibility stairs to enter home;stairs within home   Custody and Legal Status   Do you have a court appointed guardian/conservator? No   Are you an emancipated minor? No   Custody Issues? No   Paternity Affidavit Requested? No   Care Management Plan   Discharge Planning Status initial meeting   Projected Discharge Date 07/27/23    Discharge plan discussed with: Patient   CM will evaluate for rehabilitation potential yes   Patient choice offered to patient/family Yes   Facility or Agency Preferences [facility]   Discharge Needs Assessment   Outpatient/Agency/Support Group Needs other (see comments)  (Outpatient Physical Therapy)   Equipment Currently Used at Home commode;walker, front wheeled   Equipment Needed After Discharge none   Discharge Facility/Level of Care Needs Home (Patient/Family Member/other)(code 1)   Transportation Available car;family or friend will provide   Referral Information   Admission Type inpatient   Arrived From home or self-care   Home Main Entrance   Number of Stairs, Main Entrance one   Stairs Within Home, Primary   Number of Stairs, Within Home, Primary other (see comments)  (13 steps)     Initial CM assessment completed. Pt admitted with total bilateral knee arthroplasty. CM met with pt at bedside. Pt was alert and oriented to all spheres. Pt lives with his wife and plans to return there upon discharge. Pt has 1 step to get into their home and 13 steps inside of their home. Pt will have his wife, Jacorey Donaway, to assist in his care. Pt has a  walker and a bedside commode. Pt would like to have outpatient physical therapy at [facility]. CM faxed referral to [facility] and pt is scheduled for 07/31/23 at 8:00 AM.     Discharge Plan:  Home (Patient/Family Member/other) (code 1)      The patient will continue to be evaluated for developing discharge needs.     Case Manager: Yaakov Guthrie, [place]  Phone: [phone]

## 2023-07-27 NOTE — Discharge Summary (Signed)
[facility]  DISCHARGE SUMMARY      PATIENT NAME:  Brandon Wells, Brandon Wells.  MRN:  [id number]  DOB:  1951/06/18    INPATIENT ADMISSION DATE: 07/26/2023  DISCHARGE DATE:  07/27/2023    ATTENDING PHYSICIAN: Quenton Fetter, DO  SERVICE: [facility]  PRIMARY CARE PHYSICIAN: Lucia Gaskins, DO       Reason for Admission       Diagnosis    Osteoarthritis [784696]            DISCHARGE DIAGNOSIS:     Principal Problem:  Osteoarthritis    Active Hospital Problems    Diagnosis Date Noted    Principal Problem: Osteoarthritis [M19.90] 07/26/2023      Resolved Hospital Problems   No resolved problems to display.     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    Knee pain, right anterior 08/09/2022    Breast pain, left 08/09/2022    PAF (paroxysmal atrial fibrillation) (CMS HCC) 01/18/2022    Volume overload 01/14/2022    OSA on CPAP 01/14/2022    Hypoxia 01/14/2022    History of placement of stent in LAD coronary artery 01/14/2022    High cholesterol 01/14/2022    Essential hypertension 01/14/2022    History of atrial fibrillation 10/19/2016    Coronary artery disease involving native coronary artery of native heart without angina pectoris 09/07/2016    Systolic and diastolic CHF, 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      DISCHARGE MEDICATIONS:     Current Discharge Medication List        START taking these medications.        Details   aspirin 81 mg Tablet,  Chewable   81 mg, Oral, 2 TIMES DAILY  Qty: 60 Tablet  Refills: 0     HYDROcodone-acetaminophen 7.5-325 mg Tablet  Commonly known as: NORCO   1 Tablet, Oral, EVERY 4 HOURS PRN  Qty: 42 Tablet  Refills: 0            CONTINUE these medications - NO CHANGES were made during your visit.        Details   atorvastatin 80 mg Tablet  Commonly known as: LIPITOR   80 mg, Oral, NIGHTLY  Refills: 0     clopidogreL 75 mg Tablet  Commonly known as: PLAVIX   75 mg, Oral, DAILY  Refills: 0     furosemide 20 mg Tablet  Commonly known as: LASIX   20 mg, Oral, DAILY  Qty: 90 Tablet  Refills: 3     lisinopriL 5 mg Tablet  Commonly known as: PRINIVIL   5 mg, Oral, DAILY  Qty: 90 Tablet  Refills: 3     metoprolol succinate 25 mg Tablet Sustained Release 24 hr  Commonly known as: TOPROL-XL   25  mg, Oral, DAILY  Refills: 0     mometasone 50 mcg/actuation Spray, Non-Aerosol  Commonly known as: NASONEX   2 Sprays, Nasal, DAILY PRN  Qty: 51 g  Refills: 2     PREDNISOLON-MOXIFLOX-BROMF(PF) OPHT   Ophthalmic, 3 TIMES DAILY  Refills: 0              Ortho exam:  Knees are benign.  Distal CMS intact without calf pain.      Laboratory Data:     Results for orders placed or performed during the hospital encounter of 07/26/23 (from the past 24 hour(s))   BASIC METABOLIC PANEL, NON-FASTING   Result Value Ref Range    SODIUM 135 (L) 136 - 145 mmol/L    POTASSIUM 4.6 3.5 - 5.1 mmol/L    CHLORIDE 104 98 - 107 mmol/L    CO2 TOTAL 26 21 - 31 mmol/L    ANION GAP 5 4 - 13 mmol/L    CALCIUM 8.1 (L) 8.6 - 10.3 mg/dL    GLUCOSE 578 (H) 74 - 109 mg/dL    BUN 29 (H) 7 - 25 mg/dL    CREATININE 4.69 (H) 0.60 - 1.30 mg/dL    BUN/CREA RATIO 22 6 - 22    ESTIMATED GFR 57 (L) >59 mL/min/1.44m^2    OSMOLALITY, CALCULATED 286 270 - 290 mOsm/kg   CBC WITH DIFF   Result Value Ref Range    WBC 9.5 3.6 - 10.2 x10^3/uL    RBC 3.66 (L) 4.06 - 5.63 x10^6/uL    HGB 11.7 (L) 12.5 - 16.3 g/dL    HCT 62.9 (L) 52.8 - 47.1 %    MCV 93.3 73.0 - 96.2 fL    MCH 31.9 23.8 - 33.4 pg     MCHC 34.2 32.5 - 36.3 g/dL    RDW 41.3 24.4 - 01.0 %    PLATELETS 115 (L) 140 - 440 x10^3/uL    MPV 8.4 7.4 - 11.4 fL   MANUAL DIFFERENTIAL   Result Value Ref Range    WBC 9.5 x10^3/uL    NEUTROPHIL % 84 (H) 40 - 76 %    LYMPHOCYTE % 4 (L) 25 - 45 %    MONOCYTE % 4 0 - 12 %    EOSINOPHIL %      BASOPHIL %      METAMYELOCYTE %      MYELOCYTE %      PROMYELOCYTE %      BAND % 8 5 - 11 %    BLAST %      OTHER %      NEUTROPHIL ABSOLUTE 8.74 (H) 1.80 - 8.40 x10^3/uL    LYMPHOCYTE ABSOLUTE 0.38 (L) 1.10 - 5.00 x10^3/uL    MONOCYTE ABSOLUTE 0.38 0.00 - 1.30 x10^3/uL    EOSINOPHIL ABSOLUTE      BASOPHIL ABSOLUTE      METAMYELOCYTE ABSOLUTE      MYELOCYTE ABSOLUTE      PROMYELOCYTE ABSOLUTE      BLAST ABSOLUTE      OTHER CELL ABSOLUTE      ANISOCYTOSIS 1+ (10-25%)     POLYCHROMASIA      POIKILOCYTOSIS 1+ (10-25%)     BASOPHILIC STIPPLING      MICROCYTOSIS      MACROCYTOSIS      ROULEAUX      SCHISTOCYTES      SPHEROCYTES      TARGET CELLS      TEARDROP CELLS  OVALOCYTE (ELLIPTOCYTE)      CRENATED RED CELLS      STOMATOCYTES      ACANTHOCYTES (SPUR CELL)      ECHINOCYTE (BURR CELL)      BLISTER CELLS      RBC AGGLUTINATES      HOWELL JOLLY BODIES      ATYPICAL LYMPHOCYTES      TOXIC GRANULATION      DOHLE BODIES      TOXIC VACUOLIZATION      AUER RODS      BASKET CELLS      HYPERSEGMENTATION      LARGE PLATELETS      PLATELET CLUMPS      PLATELET MORPHOLOGY COMMENT Normal     BANDS NEUTROPHILS MANUAL 8     BAND ABSOLUTE      NEUTROPHILS MANUAL 84     LYMPHOCYTES MANUAL 4     MONOCYTES MANUAL 4     EOSINOPHILS MANUAL      BASOPHILS MANUAL      PROMYELOCYTES MANUAL      MYELOCYTES MANUAL      METAMYELOCYTES MANUAL      BLASTS MANUAL      TOTAL CELLS COUNTED [#] IN BLOOD 100     OTHER CELLS MANUAL      NUCLEATED RBC MANUAL      PLASMA CELL %      PLASMA CELL ABSOLUE      PLASMA CELLS MANUAL      HYPOCHROMASIA         Imaging Studies:    No orders to display       DISCHARGE INSTRUCTIONS:   Follow-up Information       Quenton Fetter, DO Follow up on 08/07/2023.    Specialty: ORTHOPAEDIC SURGERY  Why: February 17th @ 3:35 pm with Reita Cliche  Please arrive 15 minutes prior to your appointment  Please bring photo ID, medication list, insurance cards and copay  Please call Dr Baker Pierini office at [phone] should you have questions concerning your appointment  Contact information:  [address]  [place] [address]  [phone]               Lacey Jensen A, DO Follow up in 1 week(s).    Specialty: INTERNAL MEDICINE  Contact information:  [address]  [place] [address]  [phone]                              Refer to Home Health - EXTERNAL     PT EVALUATE AND TREAT- OUTPATIENT     Need to be addressed: EVALUATE AND TREAT    Reason: Post TKR protocol      DME - WALKER Front Wheeled    Please note - If patient is 300 lbs or greater please order bariatric or heavy duty items.     Patient has mobility limits that significantly impairs ability to participate in one or more mobility related ADL's (MRADL's): Yes    Moblity Limitations: Pt at heightened risk of injury r/t attempts to fulfill MRADL's & can safely use walker which resolves issue    Walker Type: Front Wheeled    Freedom of Choice: I have informed patient of their freedom of choice with respect to DME providers      DME - BEDSIDE COMMODE    A standard commode is covered when the patient is physically incapable of utilizing regular  toilet facilities.  An extra wide/heavy duty commode chair is covered for a patient who weighs 300 pounds or more.     I certify that a bedside commode is necessary due to Patient is confined to one level of home and there is no toilet on that level    Bedside Commode Type Standard Bedside Commode Extra wide for pt>BMI 40   Freedom of Choice: I have informed patient of their freedom of choice with respect to DME providers             Advance Directive Information      Flowsheet Row Most Recent Value   Does the Patient have an Advance Directive?  No, Information Offered and Refused            CONDITION ON DISCHARGE: Stable    DISCHARGE DISPOSITION:  Home discharge     Quenton Fetter, DO    Copies sent to Care Team         Relationship Specialty Notifications Start End    Lucia Gaskins, DO PCP - General INTERNAL MEDICINE Admissions 08/27/21     Phone: [phone] Fax: [phone]         [address] [place] [address]              Referring providers can utilize [email or web address] to access their referred [facility] patient's information.

## 2023-07-27 NOTE — Nurses Notes (Signed)
Patient alert and oriented at this time. Discharge AVS packet received. Patient verbalized understanding. Nozsin and instructions for use given, patient verbalized understanding. IV discontinued, cathlon intact.

## 2023-07-27 NOTE — Telephone Encounter (Signed)
Pt d/c 07/27/23 after surgery dr Lequita Halt. Seeing dr Lequita Halt on 08/07/23. F/u dr todd on 2/21

## 2023-07-27 NOTE — PT Treatment (Signed)
[facility] Medicine [facility]  [address]  [place], [address]  [phone]  (Fax) [phone]  Rehabilitation Department  Physical Therapy Daily Inpatient TKA Note    Date: 07/27/2023  Patient's Name: Brandon Wells.  Date of Birth: Apr 23, 1951  Height: Height: 172.7 cm (5\' 8" )  Weight: Weight: 79.4 kg (175 lb)      Plan: Will continue under current POC.         Subjective/Objective/Assessment:  Flowsheet    07/27/23 0809   Rehab Session   Document Type therapy progress note (daily note)   PT Visit Date 07/27/23   Total PT Minutes: 9   Patient Effort excellent   General Information   Patient Profile Reviewed yes   Medical Lines PIV Line   Respiratory Status room air   Pre Treatment Status   Pre Treatment Patient Status Patient sitting in bedside chair or w/c;Call light within reach;Patient safety alarm activated;Nurse approved session   Support Present Pre Treatment  None   Cognition   Behavior/Mood Observations behavior appropriate to situation, WNL/WFL   Vital Signs   Vitals Comment not connected   Pain Assessment   Pretreatment Pain Rating 0/10 - no pain   Posttreatment Pain Rating 7/10   Pre/Posttreatment Pain Comment with gait and 9 with steps   Transfer Assessment/Treatment   Sit-Stand Independence stand-by assistance   Stand-Sit Independence stand-by assistance   Sit-Stand-Sit, Assist Device walker, front wheeled   Gait Assessment/Treatment   Total Distance Ambulated 360   Independence  contact guard assist   Assistive Device  walker, front wheeled   Stairs Assessment/Treatment   Number of Stairs 4   Handrail Location both sides   Independence Level contact guard assist   Balance   Sitting Balance: Static good balance   Sitting, Dynamic (Balance) good balance   Sit-to-Stand Balance good balance   Standing Balance: Static fair + balance   Standing Balance: Dynamic fair + balance   Post Treatment Status   Post Treatment Patient Status Other (See comments)  (Patient in chair in  group room)   Support Present Post Treatment  None   Physical Therapy Clinical Impression   Assessment Paitent stands with SBA. He gaits with RW and CGA. He has a good step through pattern. He gaits up and down four steps with step over step pattern. He does report increase in pain with steps. Advised patient he can do one step at a time and this may decrease pain. He returns to chair in group therapy gym waiting on group exercise program.   Anticipated Discharge Disposition home;home with outpatient services               Physical Therapy Inpatient TKA D/C Criteria        PATIENT/CAREGIVER EDUCATION  Educated on exercise program? NO and COMMENT will be educated on group therapy during group.   Can successfully demonstrate exercise form? COMMENT waiting on group session to begin  Can demonstrate safe/effective assistance with functional mobility? YES     IMPAIRMENT BASED CRITERIA  Does the patient demonstrate knee ROM of at least 8-75 degrees on the involved leg? YES  Does the patient demonstrate a minimum of 3+/5 quadricep strength? YES  Does the patient demonstrate the ability to maintain any weight bearing precautions? YES  Does the patient demonstrated sensation of all LE dermatomes? YES  Does the patient have adequate pain control of <4/10 for functional mobility at home? NO and COMMENT had 7 to 9  pain with gait and mobility    FUNCTIONAL BASED CRITERIA  Does that patient demonstrate the ability to ambulate 80 feet with RW using CGA/SPV assistance? YES  Does the patient demonstrate the ability to perform bed mobility with no more than min assist? YES  Does the patient demonstrated the ability to sit to stand from the bed or chair with min assist? YES  Does the patient demonstrate the ability to walk up/down 3-5 stairs with min assist as required for home entry? YES    ROM  Extension  to be measured with group exercise class  Flexion to be measured with group exercise class      THERAPIST  Howie Rufus, PTA   07/27/2023, 08:40       Intervention minutes: GAIT TRAINING 9 MINUTES    THERAPIST  Trissa Molina, PTA  07/27/2023, 08:40

## 2023-07-27 NOTE — Nurses Notes (Signed)
Drsg removed to both knees dry and intact.right knee measuring 19/16/14.left knee measuring 18/16/13.pt ambulated total of 160 feet with staff using walker and gait belt.

## 2023-07-27 NOTE — PT Treatment (Signed)
[facility] Medicine [facility]  [address]  [place], [address]  [phone]  (Fax) [phone]  Rehabilitation Department  Physical Therapy Daily Inpatient TKA Note    Date: 07/27/2023  Patient's Name: Brandon Wells.  Date of Birth: 01-Aug-1950  Height: Height: 172.7 cm (5\' 8" )  Weight: Weight: 79.4 kg (175 lb)      Plan: Will continue under current POC.         Subjective/Objective/Assessment:  Flowsheet    07/27/23 0850   Rehab Session   Document Type therapy progress note (daily note)   PT Visit Date 07/27/23   General Information   Patient Profile Reviewed yes   Medical Lines PIV Line   Respiratory Status room air   Pre Treatment Status   Pre Treatment Patient Status Patient sitting in bedside chair or w/c   Communication Pre Treatment Comment cleared   Pre-Treatment Pain   Pretreatment Pain Rating 0/10 - no pain   Therapeutic Exercise   Comment patient particpated with group therapy, did well with all exs, all questions answered for home, as well   Post Treatment Status   Post Treatment Patient Status Patient sitting in bedside chair or w/c;Patient safety alarm activated   Communication Post Treatement Charge Nurse   Patient Effort excellent   Post-Treatment Pain   Posttreatment Pain Rating 0/10 - no pain   Cognitive Assessment/Intervention   Behavior/Mood Observations anxious   Physical Therapy Time and Intention   Total PT Minutes: 50   Therapy Plan Review/Discharge Plan (PT)   Anticipated Discharge Disposition home;home with outpatient services   RLE Assessment   Right Knee Flexion 85   Right Knee Extensor Lag 2   LLE Assessment   Left Knee Flexion 82   Left Knee Extensor Lag 2     Patient participated with group therapy, did well withall exs  all questions answered for home....          Physical Therapy Inpatient TKA D/C Criteria        PATIENT/CAREGIVER EDUCATION  Educated on exercise program? YES  Can successfully demonstrate exercise form? YES  Can demonstrate safe/effective  assistance with functional mobility? YES     IMPAIRMENT BASED CRITERIA  Does the patient demonstrate knee ROM of at least 8-75 degrees on the involved leg? YES  Does the patient demonstrate a minimum of 3+/5 quadricep strength? YES  Does the patient demonstrate the ability to maintain any weight bearing precautions? YES  Does the patient demonstrated sensation of all LE dermatomes? YES  Does the patient have adequate pain control of <4/10 for functional mobility at home? YES    FUNCTIONAL BASED CRITERIA  Does that patient demonstrate the ability to ambulate 80 feet with RW using CGA/SPV assistance? YES  Does the patient demonstrate the ability to perform bed mobility with no more than min assist? YES  Does the patient demonstrated the ability to sit to stand from the bed or chair with min assist? YES  Does the patient demonstrate the ability to walk up/down 3-5 stairs with min assist as required for home entry? YES    ROM  Extension left and right 2  Flexion left 85 and right [address]      THERAPIST  Lane Hacker, PTA  07/27/2023, 11:52       Intervention minutes: GROUP THERAPEUTIC EXERCISE 50 MINUTES    THERAPIST  Lane Hacker, PTA  07/27/2023, 11:52

## 2023-07-28 ENCOUNTER — Telehealth (HOSPITAL_COMMUNITY): Payer: Self-pay | Admitting: Internal Medicine

## 2023-07-28 NOTE — Progress Notes (Signed)
Transition of Care Contact Information  Discharge Date: 07/27/2023  Transition Facility Type--Hospital (Inpatient or Observation)  Facility Name--[facility]  Interactive Contact(s): Completed or attempted contact indicated by Date/Time  Completed Contact: 07/28/2023 11:06 AM  First Attempt Call: 07/28/2023  9:53 AM  Contact Method(s)-- Patient/Caregiver Telephone  Clinical Staff Name/Role who Charmayne Sheer RN  Transition Assessment  Discharge Summary obtained?--Yes  How are you recovering?--Improving  Discharge Meds obtained?--Yes  Discharge medication changes reviewed?--Yes  Full Medication Reconciliation Completed?--No  Medication understanding --knows new medication(s)--knows purpose of medication  Medication Concerns?--No  Have everything needed for recovery?--Yes  Care Coordination:   Patient has transition follow-up appointment date and time?--Yes  Follow up appointment date:--08/11/2023  Specialist Transition Visit planned?--Yes  Specialist Transition Visit date:--08/07/2023  Patient/caregiver plans to attend transition visit?--Yes  Primary Follow-up Barrier  Interventions provided --reinforced discharge instructions--follow-up appointment date/time reinforced--patient expresses understanding of follow-up plan  Home Health or DME ordered at discharge?--Yes  Agency has contacted patient to initiate services?--Yes  Home Health or DME Agency:  Clinician/Team notified?  Primary reason clinician notified?  Transition Note:Plan:      Patient requests transportation referral for follow up appointment? No  Instructions:     Reviewed After Visit [place]y (AVS) and discharge instructions as outlined on AVS. Yes   Agrees to contact PCP for non-acute symptoms during the hours of 8 a.m. - 4 p.m., PCP number provided/reinforced. Yes   Nurse Navigator toll free number, resources available, and 24/7 hours of operation provided to patient and/or caregiver. Yes     Referrals to population health?  No    Additional information/assessment:      Leeanne Rio, RN    [image]

## 2023-07-31 ENCOUNTER — Other Ambulatory Visit: Payer: Self-pay

## 2023-07-31 ENCOUNTER — Encounter (HOSPITAL_COMMUNITY): Payer: Self-pay

## 2023-07-31 ENCOUNTER — Ambulatory Visit (HOSPITAL_COMMUNITY)
Admission: RE | Admit: 2023-07-31 | Discharge: 2023-07-31 | Disposition: A | Discharge status: Still In Hospital | Payer: Medicare PPO | Source: Ambulatory Visit

## 2023-07-31 ENCOUNTER — Telehealth (INDEPENDENT_AMBULATORY_CARE_PROVIDER_SITE_OTHER): Payer: Self-pay | Admitting: Internal Medicine

## 2023-07-31 DIAGNOSIS — Z96653 Presence of artificial knee joint, bilateral: Secondary | ICD-10-CM | POA: Insufficient documentation

## 2023-07-31 DIAGNOSIS — K5900 Constipation, unspecified: Secondary | ICD-10-CM

## 2023-07-31 MED ORDER — LACTULOSE 10 GRAM/15 ML ORAL SOLUTION
30.0000 mL | Freq: Two times a day (BID) | ORAL | 0 refills | Status: AC
Start: 2023-07-31 — End: 2023-08-14

## 2023-07-31 NOTE — Telephone Encounter (Signed)
Patient wife came by requesting medicine for constipation due to being in hospital for knee replacement and the meds have given him constipation. [facility] [address]

## 2023-07-31 NOTE — PT Evaluation (Addendum)
[facility] Medicine [facility]  Outpatient Physical Therapy  [address]  [place], [address]  [phone]  (Fax) [phone]      Physical Therapy Lower Extremity Evaluation    Date: 07/31/2023  Patient's Name: Brandon Wells.  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 Patient notes tolerance to household mobility but notes difficulty with getting up from the chairs. Patient reports fair tolerance to sleeping at this time in the bed. Patient reports 1 STE home but notes difficulty. Patient notes difficulty with HEP from hospital most notably standing flexion and heel slides. Patient states "I have not done what I should have."    PLOF: Patient reports no AD use prior to surgery.     Previous episodes/treatments: Patient denies    Medications for this problem: pain medication and anti-inflammatory    Past Medical History:   Past Medical History:   Diagnosis Date    Allergic rhinitis     Arthritis     Back problem     CPAP (continuous positive airway pressure) dependence     Esophageal reflux     Hypercholesterolemia     Hyperlipidemia     Hypertension     Myocardial infarction (CMS HCC)     Peripheral edema     Personal history of venous thrombosis and embolism     Renal calculi     Sleep apnea     Stented coronary artery     Wears glasses          Past Surgical History:   Past Surgical History:   Procedure Laterality Date    ACHILLES TENDON REPAIR Left     CATARACT EXTRACTION Bilateral     CYSTOSCOPY W/ RETROGRADES      HX CORONARY STENT PLACEMENT      HX HERNIA REPAIR Bilateral     inguinal    HX HIP REPLACEMENT Left  09/28/2022         Patient goals: REDUCE PAIN and NORMALIZE FUNCTION    Occupation:  Retried, [employer].     Next MD visit: Patient is unsure of RTD     Pain location: Bilateral knees                     Pain description: SHARP, ACHING, and Tightness/stiffness    Pain frequency:  CONTINUOUS    Pain rating: Now    Best    Worst 8/10    Radiculopathy: Patient denies    Pain increases with: POSITION CHANGE, ADLs, ACTIVITY, EXERCISE, STAND, WALK, and STAIR CLIMBING           decreases with : COLD, MEDICATION, and REST    Sensation: Patient denies    Weakness: Patient reports BLE weakness    Sleep affected: Patient notes fair tolerance to sleeping and denies pain that wakes him.     Subjective Functional Reports:    Sitting: LIMITED    Standing: LIMITED    Walking: LIMITED    Lifting: LIMITED    Personal ADLs:  LIMITED      Patient-Specific Functional Score:    Problem Score   1. Walking pain free  0   Total 0   Total score = sum of the activity scores/number of activities    Minimal detectable change (90% CI) for avg score = 2 points    Minimal detectable change (90% CI) for single activity score = 3 points             OBJECTIVE    A/PROM   right left   Knee Flexion 68 / 73 70 / 76   Knee Extension 8  5      ROM comments:       Strength     right left   Knee flexion  NT NT   Knee extension  NT NT     Strength comments: Patient demos good quad contraction BLE. Patient able to perform SLR independently B, extensor lag noted.       Gait:  Patient demos antalgic gait pattern using FWW. Patient maintains slight knee flexion throughout gait cycle. Limited B knee flex/ext excursion throughout.     Palpation/observation: No drainage noted, scant drainage. ( - ) Homan's sign.     Posture:KNEE FLEXION      Treatment provided:REVIEW OF POC AND GOALS WITH PATIENT, ALL QUESTIONS ANSWERED, PATIENT EDUCATION, and THERAPEUTIC EXERCISE     HEP/Access Code: [email or web address]  URL: [email or web address]  Date: 07/31/2023  Prepared by: Doy Mince    Program Notes  1) Complete exercises at least 3x daily, or more if able2) Get up and walk every 1-2 hours with Clelia Schaumann) Ice for 20 minutes every hour or so as able4) Try to elevate the leg as able for 7-10 days after surgery5) Even on days of therapy, continue to work with exercises, icing, and walking at home!6) Weather the storm!  The first few weeks will be very challenging, then you will see improvements every day and be happy you got this surgery!    Exercises  - Supine Quad Set  - 4 x daily - 7 x weekly - 1 sets - 12 reps - 3 hold  - Supine Heel Slide with Strap  - 4 x daily - 7 x weekly - 1 sets - 12 reps - 3 hold  - Seated Long Arc Quad  - 4 x daily - 7 x weekly - 1 sets - 10 reps  - Seated Heel Toe Raises  - 4 x daily - 7 x weekly - 1 sets - 12 reps - 2 hold  - Seated March  - 4 x daily - 7 x weekly - 1 sets - 10 reps - 2 hold  - Seated Knee Extension Stretch with Chair  - 4 x daily - 7 x weekly - 1 sets - 1 reps - 30 hold    EXERCISE/ACTIVITY NAME REPETITIONS RESISTANCE COMPLETED THIS DOS   HEP Review   Y   Nu-Step   Next   Heel slides with strap  3 minutes each  Y   Quad sets  10xea  Y   SLR 10xea  Y   Knee extension heel prop 3 minutes   Y   SAQ   Next   LAQ 2x10  Y   Slant board stretch   Next   Knee flex/ext S    Next             ASSESSMENT    Impression: Patient is a 73 year old male referred to  PT secondary to B TKA. Static assessment revealed: no drainage observed. Patient maintains slight knee flexion with standing.  Dynamic assessment revealed: decreased pain free B knee A/PROM, impaired BLE gross muscle strength and an antalgic gait pattern using FWW. Patient will benefit from skilled PT to reduce subjective complaints, improve pain free B knee A/PROM, improve BLE gross/functional muscle strength, and normalize gait mechanics with LRAD in order to optimize functional mobility and maximize functional independence.      Rehab potential: GOOD      Short-Term Goals: 6 Weeks       -   Patient will demonstrate improved B knee AROM to at least 0 to 115 degrees to aid in ability to ambulate up and down stairs.       -  Patient will demonstrate independence with progressive HEP to maximize gains from physical therapy.       -  Patient will report max 5/10 pain to aid in completion of ADLs/work duties.       -  Patient will demonstrate improved strength of B LE WFL for supine SLR without extension lag to aid in functional transfers.       -  Patient will wean to least restrictive assistive device with minimal to no gait deviation to aid community ambulation.           Long-Term Goals: 35 Weeks       -  Patient will demonstrate improved B knee strength of at least 5/5 to aid in functional transfers.       -  Patient will demonstrate improved TUG time of maximum 14 seconds with least restrictive assistive device to demonstrate decreased falls risk.       -  Patient will perform 5xSTS < 12.6 seconds        -  Patient will demonstrate improved functional ability with Patient Specific Scale Score of at least 5.       -  Patient will demonstrate ability to ambulate up and down stairs with reciprocal pattern and no use of UEs to aid in community ambulation.       -  Patient will demonstrate improved SLS to at least 5 seconds to decrease risk of falling.       PLAN  Patient will attend 3 times per week x 12 weeks. Therapy may include, but is not limited to THERAPEUTIC EXERCISES, MYOFASCIAL/JOINT MOBILIZATION, POSTURE/BODY MECHANICS, TRANSFER/GAIT TRAINING, HOME INSTRUCTIONS, HEAT/COLD, and KINESIOTAPE    Plan for next visit: Progress TKA rehab as tolerated        Evaluation complexity:   Personal factors impacting POC: EXTREME YOUTH OR ADVANCED AGE   Co-morbidities impacting POC:  None  Complexity of physical exam: INCLUDING MUSCULOSKELETAL SYSTEM (POSTURE, ROM, STRENGTH, HEIGHT/WEIGHT), INCLUDING NEUROMUSCULAR EXAM (BALANCE, GAIT, LOCOMOTION, MOBILITY), and INCLUDING ACTIVITY/MOBILITY RESTRICTIONS   Clinical  Presentation: STABLE   Evaluation Complexity: LOW-HISTORY 0, EXAMINATION 1-2, STABLE PRESENTATION      Total Session Time 60, Timed code minutes 30, and Untimed code minutes 30         Intervention minutes: EVALUATION 30 minutes and THERAPEUTIC EXERCISE 30 minutes    Doy Mince, PT  07/31/2023, 08:01                Certification:    From:______  Through:_________    I certify the need for these services furnished under this plan of treatment and while under my care.    Referring Provider Signature: _______________     Date :  _____________________    Printed name of Referring Provider________________________________________

## 2023-08-02 ENCOUNTER — Telehealth (INDEPENDENT_AMBULATORY_CARE_PROVIDER_SITE_OTHER): Payer: Self-pay | Admitting: Internal Medicine

## 2023-08-02 ENCOUNTER — Ambulatory Visit (HOSPITAL_COMMUNITY)
Admission: RE | Admit: 2023-08-02 | Discharge: 2023-08-02 | Disposition: A | Discharge status: Still In Hospital | Payer: Self-pay | Source: Ambulatory Visit

## 2023-08-02 NOTE — Telephone Encounter (Signed)
Sure

## 2023-08-02 NOTE — Telephone Encounter (Signed)
Patient called wants to know if you would have time to speak to his grandson for a social studies project about Doreatha Martin Disease tomorrow evening.

## 2023-08-02 NOTE — PT Treatment (Signed)
[facility] Medicine [facility]  Outpatient Physical Therapy  [address]  [place], [address]  [phone]  (Fax) [phone]    Physical Therapy Treatment Note    Date: 08/02/2023  Patient's Name: Brandon Wells.  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  Has extreme difficulty getting up from low chairs and from recliner are very difficult.  Reports he is trying to do exercises at home but is difficult    Objective: activities as noted below. Ended session with CP in supine with legs on wedge      A/PROM                            Eval                           08/02/23 AAROM    Right  -  Left Right  -   Left   Knee Flexion 68 / 73    70/76    80            90   Knee Extension 8                 5     10            7         EXERCISE/ACTIVITY NAME REPETITIONS RESISTANCE COMPLETED THIS DOS   HEP Review     Y   Nu-Step  5 min  level 1 y   Heel slides with strap  3 minutes each   Y   Quad sets  10xea   Y   SLR 10xea   Y   Knee extension heel prop 3 minutes    Y   SAQ  10 each   Y    LAQ 10   Y   Slant board stretch     Next   Seated HS curl 10 each  y               Knee flex/ext S   edge of bed  gentle y        Assessment: Pt tolerated fair.  Has very limited AROM and struggles with end range stretching.  Very painful with gentle over pressure.      Short-Term Goals: 6 Weeks                  -  Patient will demonstrate improved B knee AROM to at least 0 to 115 degrees to aid in ability to ambulate up and down stairs.                  -  Patient will demonstrate independence with progressive HEP to  maximize gains from physical therapy.                  -  Patient will report max 5/10  pain to aid in completion of ADLs/work duties.                  -  Patient will demonstrate improved strength of B LE WFL for supine SLR without extension lag to aid in functional transfers.                  -  Patient will wean to least restrictive assistive device with minimal to no gait deviation to aid community ambulation.           Long-Term Goals: 95 Weeks                  -  Patient will demonstrate improved B knee strength of at least 5/5 to aid in functional transfers.                  -  Patient will demonstrate improved TUG time of maximum 14 seconds with least restrictive assistive device to demonstrate decreased falls risk.                  -  Patient will perform 5xSTS < 12.6 seconds                    -  Patient will demonstrate improved functional ability with Patient Specific Scale Score of at least 5.                  -  Patient will demonstrate ability to ambulate up and down stairs with reciprocal pattern and no use of UEs to aid in community ambulation.                  -  Patient will demonstrate improved SLS to at least 5 seconds to decrease risk of falling.               Plan:  Will monitor and proceed accordingly    Total Session Time 55, Timed code minutes 45, and Untimed code minutes 10  THERAPEUTIC EXERCISE 45 minutes      Jil Penland, PTA  08/02/2023, 10:58

## 2023-08-03 NOTE — Telephone Encounter (Signed)
NOTIFIED PATIENT NEXT WEEK IS TO LATE BUT THANKS He STATED. OJONES

## 2023-08-04 ENCOUNTER — Other Ambulatory Visit: Payer: Self-pay

## 2023-08-04 ENCOUNTER — Ambulatory Visit (HOSPITAL_COMMUNITY)
Admission: RE | Admit: 2023-08-04 | Discharge: 2023-08-04 | Disposition: A | Discharge status: Still In Hospital | Payer: Medicare PPO | Source: Ambulatory Visit

## 2023-08-04 NOTE — PT Treatment (Addendum)
[facility] Medicine [facility]  Outpatient Physical Therapy  [address]  [place], [address]  [phone]  (Fax) [phone]    Physical Therapy Treatment Note    Date: 08/04/2023  Brandon's Name: Brandon Wells.  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 Reports right knee is a lot stiffer than left but pain level is about the same bilaterally. Brandon Wells.     Objective: NuStep warm up followed by activities as noted below:    A/PROM                            Eval                           08/02/23 AAROM    Right  -  Left Right  -   Left   Knee Flexion 68 / 73    70/76    80            90   Knee Extension 8                 5     10            7         EXERCISE/ACTIVITY NAME REPETITIONS RESISTANCE COMPLETED THIS DOS   HEP Review     Y   Nu-Step 6 min  level 2 y   Heel slides with strap  3 minutes each   N   Quad sets  10xea   Y   SLR 10xea   Y   Knee extension heel prop 3 minutes    N   SAQ  10 each   Y    LAQ 10   Y   Slant board stretch One minute    Y   Seated HS curl 10 each  N               Knee flex/ext S   edge of bed  gentle y        Assessment:  Brandon is making good early progress toward goals. Right knee is more stiff than left. Still has post-op dressings on and notes Brandon was advised to keep them on until follow up with ortho.     Short-Term Goals: 6 Weeks                  -  Brandon will demonstrate improved B knee AROM to at least 0 to 115 degrees to aid in ability to ambulate up and down stairs.                  -  Brandon will demonstrate independence with progressive  HEP to maximize gains from physical therapy.                  -  Brandon will report max 5/10  pain to aid in completion of ADLs/work duties.                  -  Brandon will demonstrate improved strength of B LE WFL for supine SLR without extension lag to aid in functional transfers.                  -  Brandon will wean to least restrictive assistive device with minimal to no gait deviation to aid community ambulation.           Long-Term Goals: 66 Weeks                  -  Brandon will demonstrate improved B knee strength of at least 5/5 to aid in functional transfers.                  -  Brandon will demonstrate improved TUG time of maximum 14 seconds with least restrictive assistive device to demonstrate decreased falls risk.                  -  Brandon will perform 5xSTS < 12.6 seconds                    -  Brandon will demonstrate improved functional ability with Brandon Specific Scale Score of at least 5.                  -  Brandon will demonstrate ability to ambulate up and down stairs with reciprocal pattern and no use of UEs to aid in community ambulation.                  -  Brandon will demonstrate improved SLS to at least 5 seconds to decrease risk of falling.        Plan:  Continue to progress strength and flexibility as appropriate.    Total Session Time 40, Timed code minutes 40, and Untimed code minutes 0  THERAPEUTIC EXERCISE 40 minutes      Cheyne Bungert, PTA  08/04/2023 10:21

## 2023-08-07 ENCOUNTER — Ambulatory Visit (HOSPITAL_COMMUNITY)
Admission: RE | Admit: 2023-08-07 | Discharge: 2023-08-07 | Disposition: A | Discharge status: Still In Hospital | Payer: Medicare PPO | Source: Ambulatory Visit

## 2023-08-07 ENCOUNTER — Other Ambulatory Visit: Payer: Self-pay

## 2023-08-07 NOTE — PT Treatment (Signed)
[facility] Medicine [facility]  Outpatient Physical Therapy  [address]  [place], [address]  [phone]  (Fax) [phone]    Physical Therapy Treatment Note    Date: 08/07/2023  Patient's Name: Brandon Wells.  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 Patient reports he has not been doing his HEP as prescribed. Patient more pain in his Right knee than his left knee.      Objective: NuStep warm up followed by activities as noted below:     A/PROM                            Eval                           08/02/23 AAROM    Right  -  Left Right  -   Left   Knee Flexion 68 / 73    70/76    80            90   Knee Extension 8                 5     10            7          EXERCISE/ACTIVITY NAME REPETITIONS RESISTANCE COMPLETED THIS DOS   HEP Review     Y   Nu-Step 6 min  level 2 y   Heel slides with strap  3 minutes each   N   Quad sets  10xea   Y   SLR 15xea   Y   Knee extension heel prop 3 minutes    Y   SAQ  15x each   Y    LAQ 15x each   Y   Slant board stretch One minute    Y   Seated HS curl 10 each   N   Knee flexion stretch on step   3x30"ea   Y    Seated heel slides with towel   15xea   Y   Knee flex/ext S   edge of bed  gentle y         Assessment:  Patient demos good tolerance to exercise program this date. Patient continues to note greater difficulty with RLE exercises and ROM relative to left.      Short-Term Goals: 6 Weeks                  -  Patient will demonstrate improved B knee AROM to at least 0 to 115 degrees to aid in ability to ambulate up and  down stairs.                  -  Patient will demonstrate independence with progressive HEP to maximize gains from physical therapy.                  -  Patient will report max 5/10 pain to aid in completion of ADLs/work duties.                  -  Patient will demonstrate improved strength of B LE WFL for supine SLR without extension lag to aid in functional transfers.                  -  Patient will wean to least restrictive assistive device with minimal to no gait deviation to aid community ambulation.           Long-Term Goals: 41 Weeks                  -  Patient will demonstrate improved B knee strength of at least 5/5 to aid in functional transfers.                  -  Patient will demonstrate improved TUG time of maximum 14 seconds with least restrictive assistive device to demonstrate decreased falls risk.                  -  Patient will perform 5xSTS < 12.6 seconds                    -  Patient will demonstrate improved functional ability with Patient Specific Scale Score of at least 5.                  -  Patient will demonstrate ability to ambulate up and down stairs with reciprocal pattern and no use of UEs to aid in community ambulation.                  -  Patient will demonstrate improved SLS to at least 5 seconds to decrease risk of falling.         Plan:  Continue to progress strength and flexibility as appropriate.       Total Session Time 45 and Timed code minutes 45  THERAPEUTIC EXERCISE 45 minutes      Doy Mince, PT  08/07/2023, 10:32

## 2023-08-09 ENCOUNTER — Ambulatory Visit (HOSPITAL_COMMUNITY)
Admission: RE | Admit: 2023-08-09 | Discharge: 2023-08-09 | Disposition: A | Discharge status: Still In Hospital | Payer: Medicare PPO | Source: Ambulatory Visit

## 2023-08-09 NOTE — PT Treatment (Signed)
[facility] Medicine [facility]  Outpatient Physical Therapy  [address]  [place], [address]  [phone]  (Fax) [phone]    Physical Therapy Treatment Note    Date: 08/09/2023  Patient's Name: Brandon Wells.  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 Reports he is sleeping a little better. Reports he is trying to do his HEP. Reports its just how he feels if he does the HEP.     Objective: NuStep warm up followed by activities as noted below:     A/PROM                        Eval                     08/02/23 AAROM           2/19  AROM EOB    Right  -  Left Right  -   Left R                 L   Knee Flexion 68 / 73    70/76    80            90  89                93   Knee Extension 8                 5     10            7 8                  1          EXERCISE/ACTIVITY NAME REPETITIONS RESISTANCE COMPLETED THIS DOS   HEP Review   Ed on importance of HEP and proper cold pack application     Y  y   Nu-Step 6 min  level 2 y   Heel slides with strap  3 minutes each   N   Quad sets  10xea   Y   SLR 15xea   Y   Knee extension heel prop 3 minutes    Y   SAQ  15x each   Y    LAQ 15x each   Y   Slant board stretch One minute    Y   Seated HS curl 10 each   N   Knee flexion stretch on step   3x30"ea   Y    Seated heel slides with towel   15xea   Y   Knee flex/ext S   edge of bed  gentle y   Cols pack post tx 10 min  y         Assessment:  Patient tolerated well.. Greater difficulty with strength, swelling, and ROM with (L) knee compared to (R). Educated patient on importance of HEP and cold pack  application he verbalized understanding. He also question doing [facility] and I instructed him not to do so at this time 2/2 incisions not fully healed. He has good mobility to be just 2 weeks s/p.      Short-Term Goals: 6 Weeks                  -  Patient will demonstrate improved B knee AROM to at least 0 to 115 degrees to aid in ability to ambulate up and down stairs.                  -  Patient will demonstrate independence with progressive HEP to maximize gains from physical therapy.                  -  Patient will report max 5/10 pain to aid in completion of ADLs/work duties.                  -  Patient will demonstrate improved strength of B LE WFL for supine SLR without extension lag to aid in functional transfers.                  -  Patient will wean to least restrictive assistive device with minimal to no gait deviation to aid community ambulation.           Long-Term Goals: 56 Weeks                  -  Patient will demonstrate improved B knee strength of at least 5/5 to aid in functional transfers.                  -  Patient will demonstrate improved TUG time of maximum 14 seconds with least restrictive assistive device to demonstrate decreased falls risk.                  -  Patient will perform 5xSTS < 12.6 seconds                    -  Patient will demonstrate improved functional ability with Patient Specific Scale Score of at least 5.                  -  Patient will demonstrate ability to ambulate up and down stairs with reciprocal pattern and no use of UEs to aid in community ambulation.                  -  Patient will demonstrate improved SLS to at least 5 seconds to decrease risk of falling.         Plan:  Continue to progress strength and flexibility as appropriate.              Total Session Time 60, Timed code minutes 50, and Untimed code minutes 10  THERAPEUTIC EXERCISE 50 minutes      Trilby Leaver, PTA  08/09/2023, 10:16

## 2023-08-10 ENCOUNTER — Encounter (INDEPENDENT_AMBULATORY_CARE_PROVIDER_SITE_OTHER): Payer: Self-pay | Admitting: Internal Medicine

## 2023-08-10 ENCOUNTER — Other Ambulatory Visit: Payer: Self-pay

## 2023-08-10 ENCOUNTER — Ambulatory Visit (INDEPENDENT_AMBULATORY_CARE_PROVIDER_SITE_OTHER): Payer: Medicare PPO | Admitting: Internal Medicine

## 2023-08-10 VITALS — BP 115/67 | HR 82 | Resp 16 | Ht 67.99 in | Wt 175.0 lb

## 2023-08-10 DIAGNOSIS — Z09 Encounter for follow-up examination after completed treatment for conditions other than malignant neoplasm: Secondary | ICD-10-CM

## 2023-08-10 DIAGNOSIS — I252 Old myocardial infarction: Secondary | ICD-10-CM

## 2023-08-10 DIAGNOSIS — I10 Essential (primary) hypertension: Secondary | ICD-10-CM

## 2023-08-10 DIAGNOSIS — I2102 ST elevation (STEMI) myocardial infarction involving left anterior descending coronary artery: Secondary | ICD-10-CM

## 2023-08-10 DIAGNOSIS — Z955 Presence of coronary angioplasty implant and graft: Secondary | ICD-10-CM

## 2023-08-10 DIAGNOSIS — Z96653 Presence of artificial knee joint, bilateral: Secondary | ICD-10-CM

## 2023-08-10 NOTE — Progress Notes (Signed)
[facility]NAL [facility]ICINE, [facility]  [address]  [place] [facility] [address]    Transitional Care Management Note    Name: Brandon Wells. MRN:  [id number]   Date: 08/10/2023 Age: 73 y.o.     Chief Complaint: Hospital Discharge Transition (Discharged 07/27/23 from [facility] from bilateral knee surgery. ) and Hospital Discharge Transition       SUBJECTIVE:  Brandon Wells. is a 73 y.o. male presenting today for follow-up after being discharged. The main problem requiring admission was S/P bilateral total knee arthroplasty and performing PT well.       OBJECTIVE:   BP 115/67 (Site: Left Arm, Patient Position: Sitting, Cuff Size: Adult)   Pulse 82   Resp 16   Ht 1.727 m (5' 7.99")   Wt 79.4 kg (175 lb)   SpO2 98%   BMI 26.61 kg/m          Transition of Care Contact Information  Discharge date: Discharge Date: 07/27/2023  Transition Facility Type--Hospital (Inpatient or Observation)  Facility Name--[facility] Interactive Contact(s):  Completed Contact: 07/28/2023 11:06 AM  First Attempt Call: 07/28/2023  9:53 AM  Contact Method(s)-- Patient/Caregiver Telephone  Clinical Staff Name/Role who Charmayne Sheer RN     Data Reviewed  Medication Reconciliation completed    Assessment and Plan    ICD-10-CM    1. Hospital discharge follow-up  Z09 Exam Performed     Level of decision making Moderate      2. Essential hypertension  I10 Labs ordered here and on this visit will be be addressed by me. I will contact patient and address therapy options once completed.       3. 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 Continue current treatment plan as stable.       4. ST elevation myocardial infarction involving left anterior descending (LAD) coronary artery (CMS HCC)  I21.02 Clinically without symptoms or evidence of recurrent disease. Continue current treatment plan as stable.       5. 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          Other transition actions (Optional) -: Discharge documentation was reviewed, Discharge documentation used to reconcile outpatient medication list, and Pending tests or treatments were discussed with the patient-family-caregiver     Return in about 2 months (around 10/08/2023).    Lucia Gaskins, DO

## 2023-08-11 ENCOUNTER — Inpatient Hospital Stay (INDEPENDENT_AMBULATORY_CARE_PROVIDER_SITE_OTHER): Payer: Self-pay | Admitting: Internal Medicine

## 2023-08-11 ENCOUNTER — Ambulatory Visit (HOSPITAL_COMMUNITY)
Admission: RE | Admit: 2023-08-11 | Discharge: 2023-08-11 | Disposition: A | Discharge status: Still In Hospital | Payer: Medicare PPO | Source: Ambulatory Visit

## 2023-08-11 DIAGNOSIS — Z96653 Presence of artificial knee joint, bilateral: Secondary | ICD-10-CM

## 2023-08-11 NOTE — PT Treatment (Signed)
 [facility] Medicine [facility]  Outpatient Physical Therapy  [address]  [place], [address]  [phone]  (Fax) [phone]    Physical Therapy Treatment Note    Date: 08/11/2023  Patient's Name: Brandon Wells.  Date of Birth: 1951-03-11  Physical Therapy Visit         Visit #/POC: 6/36  Authorization: [employer]  POC Signed?:  no  POC Ends:  10/30/23  Order Ends:  open  Next Progress Note Due:  10 visits      Evaluating Physical Therapist: Doy Mince PT, DPT  PT diagnosis/Reason for Referral: B TKA 07/26/23  Next Scheduled Physician Appointment: 08/07/23  Allergies/Contraindications: None stated           Subjective:  Patient reports both knees feel more stiff today. He is unsure if it is due to the very cold weather. He rates pain at present 5-6/10, he did take pain medication about an hour ago. Had follow up with Dr Katrinka Blazing (PCP) earlier this week and reports he was pleased with progress.      Objective: NuStep warm up followed by activities as noted below:     A/PROM                        Eval                     08/02/23 AAROM           2/19  AROM EOB    Right  -  Left Right  -   Left R                 L   Knee Flexion 68 / 73    70/76    80            90  89                93   Knee Extension 8                 5     10            7 8                  1          EXERCISE/ACTIVITY NAME REPETITIONS RESISTANCE COMPLETED THIS DOS   HEP Review   Ed on importance of HEP and proper cold pack application     N  y   Nu-Step 8 min  level 2 y   Heel slides with strap  3 minutes each   N   Quad sets  10xea   N   SLR 15xea   N   Knee extension heel prop 3 minutes    Y   SAQ  15x each   N   LAQ 15x each   Y   Slant board stretch One minute    Y   Seated HS curl 10 each   N   Knee flexion stretch on step  5 x 20 sec   Y    Seated heel slides with towel   15xea   Y   Knee flex/ext S   edge of bed  gentle y   Cols pack post tx 10 min  y       BP 111/65 HR 61    Assessment:   Patient tolerated session well  up until last  few minutes of passive knee flexion ROM on the right side. He reported feeling light headed and became slow to respond and skin color appeared pale. He was immediately place in supine with legs up on bolster. BP was assessed and within normal limits. He quickly noted feeling better and felt it was due to pain level. Patient kept in this position for 10 minutes with cold packs on bilateral knees. He felt fine at end of session. I did advise him to let his wife know he had felt sick/light headed in session.      Short-Term Goals: 6 Weeks                  -  Patient will demonstrate improved B knee AROM to at least 0 to 115 degrees to aid in ability to ambulate up and down stairs.                  -  Patient will demonstrate independence with progressive HEP to maximize gains from physical therapy.                  -  Patient will report max 5/10 pain to aid in completion of ADLs/work duties.                  -  Patient will demonstrate improved strength of B LE WFL for supine SLR without extension lag to aid in functional transfers.                  -  Patient will wean to least restrictive assistive device with minimal to no gait deviation to aid community ambulation.           Long-Term Goals: 78 Weeks                  -  Patient will demonstrate improved B knee strength of at least 5/5 to aid in functional transfers.                  -  Patient will demonstrate improved TUG time of maximum 14 seconds with least restrictive assistive device to demonstrate decreased falls risk.                  -  Patient will perform 5xSTS < 12.6 seconds                    -  Patient will demonstrate improved functional ability with Patient Specific Scale Score of at least 5.                  -  Patient will demonstrate ability to ambulate up and down stairs with reciprocal pattern and no use of UEs to aid in community ambulation.                  -  Patient will demonstrate improved SLS to at least 5 seconds to  decrease risk of falling.         Plan:  Continue to progress strength and flexibility as appropriate.              Total Session Time 50, Timed code minutes 40, and Untimed code minutes 10  THERAPEUTIC EXERCISE 40 minutes      [employer], PTA  08/11/2023 10:18

## 2023-08-14 ENCOUNTER — Ambulatory Visit (HOSPITAL_COMMUNITY)
Admission: RE | Admit: 2023-08-14 | Discharge: 2023-08-14 | Disposition: A | Discharge status: Still In Hospital | Payer: Medicare PPO | Source: Ambulatory Visit

## 2023-08-14 ENCOUNTER — Other Ambulatory Visit: Payer: Self-pay

## 2023-08-14 NOTE — PT Treatment (Addendum)
 [facility] Medicine [facility]  Outpatient Physical Therapy  [address]  [place], [address]  [phone]  (Fax) [phone]    Physical Therapy Progress Note    Date: 08/24/2023  Patient's Name: Brandon Wells.  Date of Birth: 01-31-1951  Physical Therapy Progress Note           [facility]  Outpatient Physical Therapy  [address]  [place], [address]  [phone]  (Fax) [phone]    Physical Therapy Treatment Note    Date: 08/14/2023  Patient's Name: Brandon Wells.  Date of Birth: [birth year]  Physical Therapy Visit      Visit #/POC: 7/36  Authorization: [employer]  POC Signed?:  no  POC Ends:  10/30/23  Order Ends:  open  Next Progress Note Due:  10 visits      Evaluating Physical Therapist: Doy Mince PT, DPT  PT diagnosis/Reason for Referral: B TKA 07/26/23  Next Scheduled Physician Appointment: 08/07/23  Allergies/Contraindications: None stated           Subjective:  Patient reports pain as a 3-4/10 prior to today's session. Patient reports he feels pain has improved some. Patient continues to note more difficulty with R knee vs left knee. Patient reports yesterday was a rough day with pain which he attributes to attempting to wean off pain medication. Patient notes he is mostly doing stair stretches at home and not the others.     Patient 6 minutes late to today's session.    Objective: NuStep warm up followed by activities as noted below:     A/PROM                       AROM EOB   Eval 08/02/23 AAROM  08/09/23      Right  -  Left Right  -   Left R                 L   Knee Flexion 68 / 73    70/76    80            90 89               93   Knee Extension 8                 5     10            7 8                  1          EXERCISE/ACTIVITY NAME REPETITIONS RESISTANCE COMPLETED THIS DOS   HEP Review   Ed on importance of HEP and proper cold pack application     N  y   Nu-Step 8 min  level 2 y   Heel slides with strap  3 minutes each   N    Quad sets  10xea   N   SLR 15xea   N   Knee extension heel prop 3 minutes    Y   SAQ  15x each   N   LAQ 2x15 each   Y   Slant board stretch One minute    Y   Seated HS curl 10 each   N   Knee flexion stretch on step  5 x 30 sec   Y    Seated heel slides with towel  15xea   Y   Knee flex/ext S   edge of bed  gentle y   Cols pack post tx 10 min   y        Assessment:   Patient demos fair tolerance to exercise program this date. Patient continues to note difficulty with B knee flexion R > L. Patient educated on importance of HEP. Patient continues to make progress towards established short and long term PT goals. Patient demos slows but steady progress towrards improving bilateral knee AROM, patient consistently notes more difficulty/pain with RLE vs LLE. Patient will continue to benefit from skilled PT to address subjective complaints, improve pain free B knee AROM, improve BLE gross muscle strength and normalize gait mechanics in order to optimize functional mobility and maximize functional independence.        Short-Term Goals: 6 Weeks                  -  Patient will demonstrate improved B knee AROM to at least 0 to 115 degrees to aid in ability to ambulate up and down stairs. (PROGRESSING 08/14/23 )                  -  Patient will demonstrate independence with progressive HEP to maximize gains from physical therapy. ( PROGRESSING 08/14/23 )                 -  Patient will report max 5/10 pain to aid in completion of ADLs/work duties. ( PROGRESSING 08/14/23 )                 -  Patient will demonstrate improved strength of B LE WFL for supine SLR without extension lag to aid in functional transfers.                  -  Patient will wean to least restrictive assistive device with minimal to no gait deviation to aid community ambulation. ( PROGRESSING 08/14/23 )          Long-Term Goals: 12 Weeks                  -  Patient will demonstrate improved B knee strength of at least 5/5 to aid in functional transfers.                   -  Patient will demonstrate improved TUG time of maximum 14 seconds with least restrictive assistive device to demonstrate decreased falls risk.                  -  Patient will perform 5xSTS < 12.6 seconds                    -  Patient will demonstrate improved functional ability with Patient Specific Scale Score of at least 5.                  -  Patient will demonstrate ability to ambulate up and down stairs with reciprocal pattern and no use of UEs to aid in community ambulation.                  -  Patient will demonstrate improved SLS to at least 5 seconds to decrease risk of falling.         Plan:  Continue to progress strength and flexibility as appropriate.      Total Session Time 50, Timed code minutes 40, and  Untimed code minutes 10  THERAPEUTIC EXERCISE 40 minutes, 10 minutes CP       Doy Mince, PT  08/14/2023, 10:34      Doy Mince, PT  08/24/2023, 07:54

## 2023-08-16 ENCOUNTER — Ambulatory Visit (HOSPITAL_COMMUNITY)
Admission: RE | Admit: 2023-08-16 | Discharge: 2023-08-16 | Disposition: A | Discharge status: Still In Hospital | Payer: Medicare PPO | Source: Ambulatory Visit

## 2023-08-16 NOTE — PT Treatment (Signed)
 [facility]  Outpatient Physical Therapy  [address]  [place], [address]  [phone]  (Fax) [phone]    Physical Therapy Treatment Note    Date: 08/16/2023  Patient's Name: Brandon Wells.  Date of Birth: [birth year]  Physical Therapy Visit        Visit #/POC: 8/36  Authorization: [employer]  POC Signed?:  no  POC Ends:  10/30/23  Order Ends:  open  Next Progress Note Due:  10 visits      Evaluating Physical Therapist: Doy Mince PT, DPT  PT diagnosis/Reason for Referral: B TKA 07/26/23  Next Scheduled Physician Appointment: 08/07/23  Allergies/Contraindications: None stated               Subjective:  Pt reports doing well.  States knees are getting better.  Reports pain 3-4 / 10 on average.  States he is doing exercises some at home.  States he did more yesterday than he normally does.      Objective:  Activities as noted below.     A/PROM                       AROM EOB    Eval 08/02/23 AAROM  08/09/23      Right  -  Left Right  -   Left R                 L   Knee Flexion 68 / 73    70/76    80            90 93              94   Knee Extension 8                 5     10            7 8                  1          EXERCISE/ACTIVITY NAME REPETITIONS RESISTANCE COMPLETED THIS DOS   HEP Review   Ed on importance of HEP and proper cold pack application     N  y   Nu-Step 8 min  level 2 y   Heel slides with strap  3 minutes each   N   Quad sets  10xea   N   SLR 15xea   N   Knee extension heel prop 3 minutes    Y   SAQ  15x each   N   LAQ 2x15 each   Y   Slant board stretch One minute x 2   Y   Seated HS curl 10 each   N   Knee flexion stretch on step  5 x 15 sec each   Y    Seated heel slides with towel  15 sec x 5    Y   Patellar mobs  Scar massage   Y  Y                Knee flex/ext S   edge of bed  gentle y   Cold pack post tx 10 min   y        Assessment: Pt tolerated treatment well.  He does have some reflexive guarding with flexion stretching.  He continues to walk with  wheeled walker so did discuss weaning to cane in  home for now.  Pt has some thickening of incisions so started scar massage to areas that did not have scabs.  Also noted some decreased inferior patellar glide bilaterally so did begin patellar mobility    Short-Term Goals: 6 Weeks                  -  Patient will demonstrate improved B knee AROM to at least 0 to 115 degrees to aid in ability to ambulate up and down stairs.                  -  Patient will demonstrate independence with progressive HEP to maximize gains from physical therapy.                  -  Patient will report max 5/10 pain to aid in completion of ADLs/work duties.                  -  Patient will demonstrate improved strength of B LE WFL for supine SLR without extension lag to aid in functional transfers.                  -  Patient will wean to least restrictive assistive device with minimal to no gait deviation to aid community ambulation.           Long-Term Goals: 64 Weeks                  -  Patient will demonstrate improved B knee strength of at least 5/5 to aid in functional transfers.                  -  Patient will demonstrate improved TUG time of maximum 14 seconds with least restrictive assistive device to demonstrate decreased falls risk.                  -  Patient will perform 5xSTS < 12.6 seconds                    -  Patient will demonstrate improved functional ability with Patient Specific Scale Score of at least 5.                  -  Patient will demonstrate ability to ambulate up and down stairs with reciprocal pattern and no use of UEs to aid in community ambulation.                  -  Patient will demonstrate improved SLS to at least 5 seconds to decrease risk of falling.               Plan:   Will continue and progress as tolerated     Total Session Time 50, Timed code minutes 40, and Untimed code minutes 10  THERAPEUTIC EXERCISE 40 minutes      Taesha Goodell, PTA  08/16/2023, 10:15

## 2023-08-18 ENCOUNTER — Other Ambulatory Visit: Payer: Self-pay

## 2023-08-18 ENCOUNTER — Ambulatory Visit
Admission: RE | Admit: 2023-08-18 | Discharge: 2023-08-18 | Disposition: A | Discharge status: Still In Hospital | Payer: Self-pay | Source: Ambulatory Visit | Attending: Orthopaedic Surgery

## 2023-08-18 DIAGNOSIS — Z96653 Presence of artificial knee joint, bilateral: Secondary | ICD-10-CM

## 2023-08-18 NOTE — PT Treatment (Signed)
 [facility]  Outpatient Physical Therapy  [address]  [place], [address]  [phone]  (Fax) [phone]    Physical Therapy Treatment Note    Date: 08/18/2023  Patient's Name: Brandon Wells.  Date of Birth: 08/24/1950  Physical Therapy Visit        Visit #/POC: 9/36  Authorization: [employer]  POC Signed?:  no  POC Ends:  10/30/23  Order Ends:  open  Next Progress Note Due:  10 visits      Evaluating Physical Therapist: Doy Mince PT, DPT  PT diagnosis/Reason for Referral: B TKA 07/26/23  Next Scheduled Physician Appointment: 08/07/23  Allergies/Contraindications: None stated               Subjective:  Patient reports he is trying to stop taking pain medication. Did not take any all day yesterday and woke up several times during the night.  He did take pain medication before coming to PT today. He rates pain at present 2-3/10. Has not had any more episodes of light-headedness. Would like to be able to ascend 13 steps up to his bedroom.     Objective:  Activities as noted below.     A/PROM                       AROM EOB    Eval 08/02/23 AAROM  08/09/23      Right  -  Left Right  -   Left R                 L   Knee Flexion 68 / 73    70/76    80            90 93              94   Knee Extension 8                 5     10            7 8                  1          EXERCISE/ACTIVITY NAME REPETITIONS RESISTANCE COMPLETED THIS DOS   HEP Review   Ed on importance of HEP and proper cold pack application     N  N   Nu-Step 8 min  level 4 y   Heel slides with strap  3 minutes each   N   Quad sets  10xea   N   SLR 15xea   N   Knee extension heel prop 3 minutes    N   SAQ  15x each   N   LAQ 2x15 each   N   Slant board stretch One minute x 2   Y   Seated HS curl 10 each   N   Knee flexion stretch on step  5 x 20 sec each   Y    Seated heel slides with towel  15 sec x 5    N   Patellar mobs  Scar massage   N  N   Stairs 4x up/down reciprocal   Y    Forward step up 10 each 4 inch Y     Lateral step up  10 each  4 inch  Y    Leg press bilateral  10  80# Y    Knee  flex/ext S   edge of bed  gentle N   Cold pack post tx 10 min   y        Assessment:  Patient's left calf appears more atrophied than right on visualization. We were able to progress activity today to include step ups forward and lateral with good tolerance. Patient is progressing well towards goals.     Short-Term Goals: 6 Weeks                  -  Patient will demonstrate improved B knee AROM to at least 0 to 115 degrees to aid in ability to ambulate up and down stairs.                  -  Patient will demonstrate independence with progressive HEP to maximize gains from physical therapy.                  -  Patient will report max 5/10 pain to aid in completion of ADLs/work duties.                  -  Patient will demonstrate improved strength of B LE WFL for supine SLR without extension lag to aid in functional transfers.                  -  Patient will wean to least restrictive assistive device with minimal to no gait deviation to aid community ambulation.           Long-Term Goals: 27 Weeks                  -  Patient will demonstrate improved B knee strength of at least 5/5 to aid in functional transfers.                  -  Patient will demonstrate improved TUG time of maximum 14 seconds with least restrictive assistive device to demonstrate decreased falls risk.                  -  Patient will perform 5xSTS < 12.6 seconds                    -  Patient will demonstrate improved functional ability with Patient Specific Scale Score of at least 5.                  -  Patient will demonstrate ability to ambulate up and down stairs with reciprocal pattern and no use of UEs to aid in community ambulation.                  -  Patient will demonstrate improved SLS to at least 5 seconds to decrease risk of falling.               Plan:   Add calf raises next session. Assess response to today's progressions.     Total Session Time 45,  Timed code minutes 35, and Untimed code minutes 10  THERAPEUTIC EXERCISE 35 minutes      Jasmina Gendron, PTA 08/18/2023 10:20

## 2023-08-21 ENCOUNTER — Ambulatory Visit (HOSPITAL_COMMUNITY)
Admission: RE | Admit: 2023-08-21 | Discharge: 2023-08-21 | Disposition: A | Discharge status: Still In Hospital | Payer: Self-pay | Source: Ambulatory Visit | Attending: Orthopaedic Surgery

## 2023-08-21 ENCOUNTER — Other Ambulatory Visit: Payer: Self-pay

## 2023-08-21 DIAGNOSIS — Z96653 Presence of artificial knee joint, bilateral: Secondary | ICD-10-CM | POA: Insufficient documentation

## 2023-08-21 NOTE — PT Treatment (Signed)
 [facility] Medicine [facility]  Outpatient Physical Therapy  [address]  [place], [address]  [phone]  (Fax) [phone]    Physical Therapy Treatment Note    Date: 08/21/2023  Patient's Name: Brandon Wells.  Date of Birth: 1951-02-06  Physical Therapy Visit        Visit #/POC: 10/36  Authorization: [employer]  POC Signed?:  no  POC Ends:  10/30/23  Order Ends:  open  Next Progress Note Due:  10 visits      Evaluating Physical Therapist: Doy Mince PT, DPT  PT diagnosis/Reason for Referral: B TKA 07/26/23  Next Scheduled Physician Appointment: 08/07/23  Allergies/Contraindications: None stated               Subjective:  Reports he has stopped using walker. He did take pain medication this morning but did not take anything last night. He slept through the night without any medication. He has tried going up and down a few stairs at home and notes coming down was very difficult.     Objective:  Enters without assistive device with slow gait with minimal knee flexion. Treatment delivered as noted below:    A/PROM                       AROM EOB    Eval 08/02/23 AAROM  08/09/23  08/21/23     Right  -  Left Right  -   Left R                 L R                L   Knee Flexion 68 / 73    70/76    80            90 93              94 92              102   Knee Extension 8                 5     10            7 8                  1           EXERCISE/ACTIVITY NAME REPETITIONS RESISTANCE COMPLETED THIS DOS   HEP Review   Ed on importance of HEP and proper cold pack application     N  N   Nu-Step 8 min  level 4 y   Heel slides with strap  3 minutes each   N   Quad sets  10xea   N   SLR 15xea   N   Knee extension heel prop 3 minutes    N   SAQ  15x each   N   LAQ 2x15 each   N   Slant board stretch One minute x 2   Y   Seated HS curl 10 each   N   Knee flexion stretch on step  5 x 20 sec each   Y    Seated heel slides with towel  15 sec x 5    N   Patellar mobs  Scar massage   N  N   Stairs 5x up/down  reciprocal   Y    Forward step up 10 each 6 inch Y  Lateral step up  10 each  6 inch  Y    Leg press bilateral  2 x 15  80# Y    Knee flex/ext S   edge of bed  gentle N   Cold pack post tx 10 min   N   SPC instruction    Y    Double heel raise  15  Y         Assessment:  Patient able to demonstrate improved gait pattern in clinic with SPC vs no assistive device. Tolerated progression of step height in clinic from 4 inches to 6 inches today with good form. He continues to make steady progress toward goals. Still with limited ROM bilaterally, right > left.     Short-Term Goals: 6 Weeks                  -  Patient will demonstrate improved B knee AROM to at least 0 to 115 degrees to aid in ability to ambulate up and down stairs.                  -  Patient will demonstrate independence with progressive HEP to maximize gains from physical therapy.                  -  Patient will report max 5/10 pain to aid in completion of ADLs/work duties.                  -  Patient will demonstrate improved strength of B LE WFL for supine SLR without extension lag to aid in functional transfers.                  -  Patient will wean to least restrictive assistive device with minimal to no gait deviation to aid community ambulation.           Long-Term Goals: 82 Weeks                  -  Patient will demonstrate improved B knee strength of at least 5/5 to aid in functional transfers.                  -  Patient will demonstrate improved TUG time of maximum 14 seconds with least restrictive assistive device to demonstrate decreased falls risk.                  -  Patient will perform 5xSTS < 12.6 seconds                    -  Patient will demonstrate improved functional ability with Patient Specific Scale Score of at least 5.                  -  Patient will demonstrate ability to ambulate up and down stairs with reciprocal pattern and no use of UEs to aid in community ambulation.                  -  Patient will demonstrate  improved SLS to at least 5 seconds to decrease risk of falling.               Plan:   Continue progression of functional strength.     Total Session Time 40, Timed code minutes 40, and Untimed code minutes 0  THERAPEUTIC EXERCISE 40 minutes      Yehia Mcbain, PTA 08/21/2023 10:19

## 2023-08-23 ENCOUNTER — Other Ambulatory Visit: Payer: Self-pay

## 2023-08-23 ENCOUNTER — Ambulatory Visit (HOSPITAL_COMMUNITY)
Admission: RE | Admit: 2023-08-23 | Discharge: 2023-08-23 | Disposition: A | Discharge status: Still In Hospital | Payer: Self-pay | Source: Ambulatory Visit

## 2023-08-23 NOTE — PT Treatment (Signed)
 [facility] Medicine [facility]  Outpatient Physical Therapy  [address]  [place], [address]  [phone]  (Fax) [phone]    Physical Therapy Treatment Note    Date: 08/23/2023  Patient's Name: Brandon Wells.  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 He states his pain is primarily when trying to sleep and sometimes will have to get up and take a 1/2 dose of pain meds. Pain at worst in the past few weeks 4-5/10.    Patient-Specific Functional Score:     Problem Score                              08/23/23   1. Walking pain free  0                                       8   Total 0                                       8   Total score = sum of the activity scores/number of activities    Minimal detectable change (90% CI) for avg score = 2 points    Minimal detectable change (90% CI) for single activity score = 3 points          Objective:  Enters amb with SBQC. Treatment delivered as noted below:    MMT: Quads 4+/5, HS 5/5.    SLS stand: RLE 3 sec then 15 sec on 2nd performance; LLE 30 sec with one performance.    TUG: 9.53 sec without AD    A/PROM                       AROM EOB    Eval 08/02/23 AAROM  08/09/23  08/21/23 08/23/23     Right  -  Left Right  -   Left R                 L R                L R              L   Knee Flexion 68 / 73    70/76    80            90 93              94 92              102  112         113   Knee Extension 8  5     10            7 8                  1  3               1          EXERCISE/ACTIVITY NAME REPETITIONS  RESISTANCE COMPLETED THIS DOS   HEP Review   Ed on importance of HEP and proper cold pack application  HEP/Access Code: [email or web address]    N  N   Nu-Step  LBE 8 min  X10 min  level 4  Seat position 10 with 9 showing N  y   Heel slides with strap  3 minutes each   N   Quad sets  10xea   N   SLR 15xea   N   Knee extension heel prop 3 minutes    N   SAQ  15x each   N   LAQ 2x15 each   N   Slant board stretch One minute x 2   n   Seated HS curl 10 each   N   Knee flexion stretch on step  5 x 20 sec each   n    Seated heel slides with towel  15 sec x 5    N   Patellar mobs  Scar massage   N  N   Stairs 5x up/down reciprocal   n   Forward step up 10 each 6 inch y   Lateral step up  10 each  6 inch  n   Leg press bilateral  2 x 15  80# n   Knee flex/ext S   edge of bed  gentle N   Cold pack post tx 10 min   N   SPC instruction    n   Double heel raise  15  n   Prone Quad stretch:  --manual  --with strap   2x 10 sec  5x 10 sec    Y  Y: HEP 08/23/23   STS from chair x10  y   Re-assessment of goals and objectives   y        Assessment:  Initiated LBE and prone Quad stretch to facilitate bilat knee flexion AROM gains. Excellent gains with supine bilat knee flexion following prone Quad stretch, manually pre and post this exercise a great difference in the degree of tightness is noted. Added prone Quad stretch to HEP.  Patient reports at home he is not really using AD, but brings it to therapy for safety incase muscle fatigue occurs as he amb to vehicle following session. See below for progress towards goals.    Short-Term Goals: 6 Weeks                  -  Patient will demonstrate improved B knee AROM to at least 0 to 115 degrees to aid in ability to ambulate up and down stairs. (Progressing 08/23/23)                 -  Patient will demonstrate independence with progressive HEP to maximize gains from physical therapy. (MET 08/23/23)                 -  Patient will report max 5/10 pain to aid in completion of ADLs/work duties. (MET  08/23/23)                 -  Patient will demonstrate improved strength of B LE WFL for supine SLR without extension lag to aid in functional transfers. (MET 08/23/23)                 -  Patient will wean to least restrictive assistive device with minimal to no gait deviation to aid community ambulation. (Progressing 08/23/23)          Long-Term Goals: 12 Weeks                  -  Patient will demonstrate improved B knee strength of at least 5/5 to aid in functional transfers. (Progressing 08/23/23)                 -  Patient will demonstrate improved TUG time of maximum 14 seconds with least restrictive assistive device to demonstrate decreased falls risk. (MET 08/23/23)                 -  Patient will perform 5xSTS < 12.6 seconds (Did not assess 08/23/23)                    -  Patient will demonstrate improved functional ability with Patient Specific Scale Score of at least 5. (MET 08/23/23)                 -  Patient will demonstrate ability to ambulate up and down stairs with reciprocal pattern and no use of UEs to aid in community ambulation.  (Did not assess 08/23/23)                 -  Patient will demonstrate improved SLS to at least 5 seconds to decrease risk of falling. (Progressing 08/23/23 as not consistent with RLE).              Plan:   RTD later in the day following next session. Assess if he has maintained flexion gains. Will re-assess goals not addressed this day.    Total Session Time 55, Timed code minutes 45, and Untimed code minutes 10  THERAPEUTIC EXERCISE 45 minutes      Tarri Abernethy, PTA  08/23/2023 12:56

## 2023-08-25 ENCOUNTER — Ambulatory Visit (HOSPITAL_COMMUNITY)
Admission: RE | Admit: 2023-08-25 | Discharge: 2023-08-25 | Disposition: A | Discharge status: Still In Hospital | Payer: Self-pay | Source: Ambulatory Visit | Attending: Orthopaedic Surgery

## 2023-08-25 NOTE — PT Treatment (Signed)
 [facility]  Outpatient Physical Therapy  [address]  [place], [address]  [phone]  (Fax) [phone]    Physical Therapy Treatment Note    Date: 08/25/2023  Patient's Name: Brandon Wells.  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  Feels like he is getting better.  Is doing exercise and stretches at home.  Will see doctor today.  Rates pain 2-3/10 with medication.      Objective:  Activities as noted below       A/PROM                       AROM EOB    Eval 08/02/23 AAROM  08/09/23  08/21/23 08/23/23     Right  -  Left Right  -   Left R                 L R                L R              L   Knee Flexion 68 / 73    70/76    80            90 93              94 92              102  112         113   Knee Extension 8                 5     10            7 8                  1   3               1          EXERCISE/ACTIVITY NAME REPETITIONS RESISTANCE COMPLETED THIS DOS   HEP Review   Ed on importance of HEP and proper cold pack application  HEP/Access Code: [email or web address]    N  N   Nu-Step  LBE 8 min  X10 min  level 4  Seat position 10 with 9 showing N  y   Heel slides with strap  3 minutes each   N   Quad sets  10xea   N   SLR 15xea   N   Knee extension heel prop 3 minutes    N   SAQ  15x each   N   LAQ 2x15 each  2.5# y   [employer] stretch One minute x 2   n   Seated HS curl 10 each   N   Knee flexion stretch on step  5 x 20 sec each   y    Seated heel slides with towel  15 sec x 5  N   Patellar mobs  Scar massage     y  N   Stairs 5x up/down reciprocal    n   Forward step up 10 each 6  inch y   Lateral step up  10 each  6 inch  n   Leg press bilateral  2 x 15  80# n   Knee flex/ext S   edge of bed  gentle N   Cold pack post tx 10 min   N   SPC instruction      n   Double heel raise  15   n   Prone Quad stretch:  --manual  --with strap      Prone HS curl    2x 10 sec  5x 20 sec      2 x 10 each            2.5#    Y  Y: HEP 08/23/23    y   STS from chair  Staggered stance sit-stand  X10  10 each   Y  y   Re-assessment of goals and objectives     n        Assessment: Pt tolerated treatment well.  He continues to have difficulty with end range of motion but overall showing improvement.  Goals reassessed over past few visits as noted below.     Short-Term Goals: 6 Weeks                  -  Patient will demonstrate improved B knee AROM to at least 0 to 115 degrees to aid in ability to ambulate up and down stairs. (Progressing 08/23/23)                 -  Patient will demonstrate independence with progressive HEP to maximize gains from physical therapy. (MET 08/23/23)                 -  Patient will report max 5/10 pain to aid in completion of ADLs/work duties. (MET 08/23/23)                 -  Patient will demonstrate improved strength of B LE WFL for supine SLR without extension lag to aid in functional transfers. (MET 08/23/23)                 -  Patient will wean to least restrictive assistive device with minimal to no gait deviation to aid community ambulation. (Progressing 08/23/23)          Long-Term Goals: 12 Weeks                  -  Patient will demonstrate improved B knee strength of at least 5/5 to aid in functional transfers. (Progressing 08/23/23)                 -  Patient will demonstrate improved TUG time of maximum 14 seconds with least restrictive assistive device to demonstrate decreased falls risk. (MET 08/23/23)                 -  Patient will perform 5xSTS < 12.6 seconds (Did not assess 08/23/23)                    -  Patient will demonstrate improved functional ability with Patient Specific Scale  Score of at least 5. (MET 08/23/23)                 -  Patient will demonstrate ability to ambulate up and down stairs with reciprocal pattern and no use of UEs to aid in community ambulation.  (Did not assess 08/23/23)                 -  Patient will demonstrate improved SLS to at least 5 seconds to decrease risk of falling. (Progressing 08/23/23 as not consistent with RLE).            Plan:  Will continue and progress as tolerated.  Pt will see doctor later today    Total Session Time 40 and Timed code minutes 40  THERAPEUTIC EXERCISE 40 minutes      Aundrey Elahi, PTA  08/25/2023, 10:13

## 2023-08-28 ENCOUNTER — Ambulatory Visit (HOSPITAL_COMMUNITY)
Admission: RE | Admit: 2023-08-28 | Discharge: 2023-08-28 | Disposition: A | Discharge status: Still In Hospital | Payer: Self-pay | Source: Ambulatory Visit

## 2023-08-28 NOTE — PT Treatment (Signed)
 [facility] Medicine [facility]  Outpatient Physical Therapy  [address]  [place], [address]  [phone]  (Fax) [phone]    Physical Therapy Treatment Note    Date: 08/28/2023  Patient's Name: Brandon Wells.  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  Doctors appointment went well and they are pleased with progress.  States he has had some difficulty sleeping the past few days.  States he is doing exercise at home as advised.  Is working hard on stretching at home.      Objective:  activities as noted below.       A/PROM                       AROM EOB    Eval 08/02/23 AAROM  08/09/23  08/21/23 08/28/23  AAROM     Right  -  Left Right  -   Left R                 L R                L R              L   Knee Flexion 68 / 73    70/76    80            90 93              94 92              102  115         113   Knee Extension 8                 5     10            7 8                  1   3               1          EXERCISE/ACTIVITY NAME REPETITIONS RESISTANCE COMPLETED THIS DOS   HEP Review   Ed on importance of HEP and proper cold pack application  HEP/Access Code: [email or web address]    N  N   Nu-Step  LBE 8 min  X10 min  level 4  Seat position 10 with 9 showing N  y   Heel slides with strap  3 minutes each   N   Quad sets  10xea   N   SLR 15xea   N   Knee extension heel prop 3 minutes    N   SAQ  15x each   N   LAQ  Seated HS curl 2x15 each  10 each  2.5#   blue Y  y   Slant board stretch One minute x 2   n   Seated HS curl 10 each  N   Knee flexion stretch on step  5 x 20 sec each   y    Seated heel  slides with towel  15 sec x 5    N   Patellar mobs  Scar massage     y  y   Stairs 5x up/down reciprocal    n   Forward step up 10 each 6 inch y   Lateral step up  10 each  6 inch  y   Leg press bilateral  2 x 15  80# n   Knee flex/ext S   edge of bed  gentle y   Cold pack post tx 10 min   N   SPC instruction      n   Double heel raise  15   n   Prone Quad stretch:  --manual  --with strap        Prone HS curl    2x 10 sec  5x 20 sec        2 x 10 each                2.5#    n  N : HEP 08/23/23     y   STS from chair  Staggered stance sit-stand  X10  10 each   N  n   Re-assessment of goals and objectives     n          Assessment:  Pt tolerated treatment well.  He is progressing nicely with treatment.  Still exhibits some weakness and some slight limitation in ROM.  Incisions are looking clean and healing.  Some thickness noted bilateral incisions but improving with scar massage.     Short-Term Goals: 6 Weeks                  -  Patient will demonstrate improved B knee AROM to at least 0 to 115 degrees to aid in ability to ambulate up and down stairs. (Progressing 08/23/23)                 -  Patient will demonstrate independence with progressive HEP to maximize gains from physical therapy. (MET 08/23/23)                 -  Patient will report max 5/10 pain to aid in completion of ADLs/work duties. (MET 08/23/23)                 -  Patient will demonstrate improved strength of B LE WFL for supine SLR without extension lag to aid in functional transfers. (MET 08/23/23)                 -  Patient will wean to least restrictive assistive device with minimal to no gait deviation to aid community ambulation. (Progressing 08/23/23)          Long-Term Goals: 12 Weeks                  -  Patient will demonstrate improved B knee strength of at least 5/5 to aid in functional transfers. (Progressing 08/23/23)                 -  Patient will demonstrate improved TUG time of maximum 14 seconds with least restrictive assistive device to  demonstrate decreased falls risk. (MET 08/23/23)                 -  Patient will perform  5xSTS < 12.6 seconds (Did not assess 08/23/23)                    -  Patient will demonstrate improved functional ability with Patient Specific Scale Score of at least 5. (MET 08/23/23)                 -  Patient will demonstrate ability to ambulate up and down stairs with reciprocal pattern and no use of UEs to aid in community ambulation.  (Did not assess 08/23/23)                 -  Patient will demonstrate improved SLS to at least 5 seconds to decrease risk of falling. (Progressing 08/23/23 as not consistent with RLE).            Plan:  Will continue and progress as tolerated     Total Session Time 45 and Timed code minutes 45  THERAPEUTIC EXERCISE 45 minutes      Jennine Peddy, PTA  08/28/2023, 09:30

## 2023-08-30 ENCOUNTER — Ambulatory Visit (HOSPITAL_COMMUNITY)
Admission: RE | Admit: 2023-08-30 | Discharge: 2023-08-30 | Disposition: A | Discharge status: Still In Hospital | Payer: Self-pay | Source: Ambulatory Visit

## 2023-08-30 NOTE — Progress Notes (Signed)
 [facility] Medicine [facility]  Outpatient Physical Therapy  [address]  [place], [address]  [phone]  (Fax) [phone]    Physical Therapy Treatment Note    Date: 08/30/2023  Patient's Name: Brandon Wells.  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 Patient reports he is doing better sleeping but notes he is taking his pain medicaiton before going to bed. Patient notes good tolerance to household/community ambulation with no AD.      Objective:  activities as noted below.         A/PROM                       AROM EOB    Eval 08/02/23 AAROM  08/09/23  08/21/23 08/28/23  AAROM     Right  -  Left Right  -   Left R                 L R                L R              L   Knee Flexion 68 / 73    70/76    80            90 93              94 92              102 115         113   Knee Extension 8                 5     10            7 8                  1   3               1         Strength       Right  Left    Knee flexion  4+/5 4+/5   Knee extension  4+/5 4+/5        Functional Testing    08/30/23    5xSTS 7.81   TUG 7.47        EXERCISE/ACTIVITY NAME REPETITIONS RESISTANCE COMPLETED THIS DOS   HEP Review   Ed on importance of HEP and proper cold pack application  HEP/Access Code: [email or web address]    N  N   Nu-Step  LBE 8 min  X10 min  level 4  Seat position 10 with 9 showing N  y   Heel slides with strap  3 minutes each   N   Quad sets  10xea   N   SLR 15xea   N   Knee extension heel prop 3 minutes    N   SAQ  15x each   N   LAQ  Seated HS curl 2x15 each  10 each   2.5#   blue Y  y   Slant board stretch One minute x 2   n   Seated HS curl 10 each   N   Knee flexion stretch on step  5 x 20 sec each   y    Seated heel slides with towel  15 sec x 5    N   Patellar mobs  Scar massage     y  y   Stairs 5x up/down reciprocal    n   Forward step up 15x each 6 inch y   Lateral step up  15x each  6 inch  y   Step-downs 15xea 6 inch  Y   Leg press bilateral  2 x 15  80# n   Knee flex/ext S   edge of bed  gentle y   Cold pack post tx 10 min   N   SPC instruction      n   Double heel raise  15   n   Prone Quad stretch:  --manual  --with strap        Prone HS curl    2x 10 sec  5x 20 sec        2 x 10 each                2.5#    n  N : HEP 08/23/23     y   STS from chair  Staggered stance sit-stand  X10  10 each   N  n   Re-assessment of goals and objectives     Y            Assessment:  Patient continues to make good progress towards established short and long term PT goals. Patient continues to make improvements to B knee AROM, gross/functional muscle strength and tolerance to functional mobility.  Patient will continue to benefit from skilled PT to optimize functional mobility and maximize functional return.      Short-Term Goals: 6 Weeks                  -  Patient will demonstrate improved B knee AROM to at least 0 to 115 degrees to aid in ability to ambulate up and down stairs. (Progressing 08/23/23)                 -  Patient will demonstrate independence with progressive HEP to maximize gains from physical therapy. (MET 08/23/23)                 -  Patient will report max 5/10 pain to aid in completion of ADLs/work duties. (MET 08/23/23)                 -  Patient will demonstrate improved strength of B LE WFL for supine SLR without extension lag to aid in functional transfers. (MET 08/23/23)                 -  Patient will wean to least restrictive assistive device with minimal to no gait deviation to aid community ambulation. (Progressing 08/23/23)          Long-Term Goals: 12 Weeks                   -  Patient will demonstrate improved B knee strength of at least 5/5 to aid in  functional transfers. (Progressing 08/23/23)                 -  Patient will demonstrate improved TUG time of maximum 14 seconds with least restrictive assistive device to demonstrate decreased falls risk. (MET 08/23/23)                 -  Patient will perform 5xSTS < 12.6 seconds (MET 08/30/23)                    -  Patient will demonstrate improved functional ability with Patient Specific Scale Score of at least 5. (MET 08/23/23)                 -  Patient will demonstrate ability to ambulate up and down stairs with reciprocal pattern and no use of UEs to aid in community ambulation.  (Progressing 08/30/23)                 -  Patient will demonstrate improved SLS to at least 5 seconds to decrease risk of falling. (Progressing 08/23/23 as not consistent with RLE).              Plan:  Will continue and progress as tolerated     Total Session Time 42 and Timed code minutes 42  THERAPEUTIC EXERCISE 42 minutes      Doy Mince, PT  08/30/2023, 10:30

## 2023-09-01 ENCOUNTER — Ambulatory Visit (HOSPITAL_COMMUNITY)
Admission: RE | Admit: 2023-09-01 | Discharge: 2023-09-01 | Disposition: A | Discharge status: Still In Hospital | Payer: Self-pay | Source: Ambulatory Visit

## 2023-09-01 NOTE — PT Treatment (Signed)
 [facility] Medicine [facility]  Outpatient Physical Therapy  [address]  [place], [address]  [phone]  (Fax) [phone]    Physical Therapy Treatment Note    Date: 09/01/2023  Patient's Name: Brandon Wells.  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  States he is having some difficulty sleeping through the night.  Rates pain 3/10 today.  Does he does have much difficulty getting comfortable for long periods of time.     Objective:  Activities as noted below.       A/PROM                       AROM EOB    Eval 08/02/23 AAROM  08/09/23  08/21/23 08/28/23  AAROM     Right  -  Left Right  -   Left R                 L R                L R              L   Knee Flexion 68 / 73    70/76    80            90 93              94 92              102 115         113   Knee Extension 8                 5     10            7 8                  1   3               1          Strength       Right  Left    Knee flexion  4+/5 4+/5   Knee extension  4+/5 4+/5         Functional Testing     08/30/23    5xSTS 7.81   TUG 7.47         EXERCISE/ACTIVITY NAME REPETITIONS RESISTANCE COMPLETED THIS DOS   HEP Review   Ed on importance of HEP and proper cold pack application  HEP/Access Code: [email or web address]    N  N   Nu-Step  LBE 8 min  X7 min  level 4  Seat position 10 with 9 showing N  y   Heel slides with strap  3 minutes each   N   Quad sets  10xea   N   SLR 15xea   N   Knee extension heel prop 3 minutes    N   SAQ  15x each   N  LAQ  Seated HS curl 2x15 each  10 each  2.5#   blue Y  y   Slant board stretch One minute x 2   n    Seated HS curl 10 each   N   Knee flexion stretch on step  5 x 20 sec each   y    Seated heel slides with towel  15 sec x 5    N   Patellar mobs  Scar massage     y  y   Stairs 5x up/down reciprocal    y   Forward step up 15x each 6 inch y   Lateral step up  15x each  6 inch  y   Step-downs 15xea 6 inch  Y   Leg press bilateral  Leg press single 2 x 15   2 x 10 each 80#  60 N  Y    Knee flex/ext S   edge of bed   y   Cold pack post tx 10 min   y   [facility] instruction      n   Double heel raise  15   n   Prone Quad stretch:  --manual  --with strap        Prone HS curl    2x 10 sec  5x 20 sec        2 x 10 each                2.5#    n  N : HEP 08/23/23     n   STS from chair  Staggered stance sit-stand   Squat to table  X10  5 each  2 x 5   Y  Y  y   Re-assessment of goals and objectives     n        Assessment: Pt tolerated well overall.  Did request ice after exercise d/t soreness.  Progressed strengthening today.  Pt tolerated but did fatigue with closed chain activities.      Short-Term Goals: 6 Weeks                  -  Patient will demonstrate improved B knee AROM to at least 0 to 115 degrees to aid in ability to ambulate up and down stairs. (Progressing 08/23/23)                 -  Patient will demonstrate independence with progressive HEP to maximize gains from physical therapy. (MET 08/23/23)                 -  Patient will report max 5/10 pain to aid in completion of ADLs/work duties. (MET 08/23/23)                 -  Patient will demonstrate improved strength of B LE WFL for supine SLR without extension lag to aid in functional transfers. (MET 08/23/23)                 -  Patient will wean to least restrictive assistive device with minimal to no gait deviation to aid community ambulation. (Progressing 08/23/23)          Long-Term Goals: 12 Weeks                  -  Patient will demonstrate improved B knee strength of at least 5/5 to aid in functional transfers. (Progressing 08/23/23)                 -  Patient will  demonstrate improved TUG time of maximum 14 seconds with least restrictive assistive device to demonstrate decreased falls risk. (MET 08/23/23)                 -  Patient will perform 5xSTS < 12.6 seconds (MET 08/30/23)                    -  Patient will demonstrate improved functional ability with Patient Specific Scale Score of at least 5. (MET 08/23/23)                 -  Patient will demonstrate ability to ambulate up and down stairs with reciprocal pattern and no use of UEs to aid in community ambulation.  (Progressing 08/30/23)                 -  Patient will demonstrate improved SLS to at least 5 seconds to decrease risk of falling. (Progressing 08/23/23 as not consistent with RLE).              Plan:  will continue and progress as able.      Total Session Time 55, Timed code minutes 45, and Untimed code minutes 10  THERAPEUTIC EXERCISE 45 minutes      Dilyn Osoria, PTA  09/01/2023, 10:15

## 2023-09-04 ENCOUNTER — Ambulatory Visit (HOSPITAL_COMMUNITY)
Admission: RE | Admit: 2023-09-04 | Discharge: 2023-09-04 | Disposition: A | Discharge status: Still In Hospital | Payer: Self-pay | Source: Ambulatory Visit

## 2023-09-04 ENCOUNTER — Other Ambulatory Visit: Payer: Self-pay

## 2023-09-04 NOTE — PT Treatment (Signed)
 [facility] Medicine [facility]  Outpatient Physical Therapy  [address]  [place], [address]  [phone]  (Fax) [phone]    Physical Therapy Treatment Note    Date: 09/04/2023  Patient's Name: Brandon Wells.  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 Patient reports he was so sore after last session he couldn't move all weekend. Patient continues to note pain that wakes him during the night. Patient reports soreness was isolated to right groin.      Objective:  Activities as noted below.        EXERCISE/ACTIVITY NAME REPETITIONS RESISTANCE COMPLETED THIS DOS   HEP Review   Ed on importance of HEP and proper cold pack application  HEP/Access Code: [email or web address]    N  N   Nu-Step  LBE 8 min  X7 min  level 4  Seat position 10 with 9 showing N  y   Heel slides with strap  3 minutes each   N   Quad sets  10xea   N   SLR 15xea   N   Knee extension heel prop 3 minutes    N   SAQ  15x each   N   LAQ  Seated HS curl 2x15 each  10 each  2.5#   blue N  N   Slant board stretch One minute x 2   n   Seated HS curl 10 each   N   Knee flexion stretch on step  5 x 20 sec each   y    Seated heel slides with towel  15 sec x 5    N   Patellar mobs  Scar massage     N  N   Stairs 5x up/down reciprocal    N   Forward step up 15x each 6 inch y   Lateral step up  15x each  6 inch  y   Step-downs 15xea 6 inch  Y   Leg press bilateral  Leg press single 2  x 15   2 x 10 each 80#  60# N  Y    Knee flex/ext S   edge of bed   y   Cold pack post tx 10 min   Y   SPC instruction      n   Double heel raise  15   n   Prone Quad stretch:  --manual  --with strap        Prone HS curl    2x 10 sec  5x 20 sec        2 x 10 each                2.5#    n  N : HEP 08/23/23     n  STS from chair  Staggered stance sit-stand   Squat to table  2 x 10  2 x 5 each  2 x 5   Y  Y  y   Re-assessment of goals and objectives     n         Assessment: Patient demos good tolerance to exercise program this date and denies increased pain throughout.     Short-Term Goals: 6 Weeks                  -  Patient will demonstrate improved B knee AROM to at least 0 to 115 degrees to aid in ability to ambulate up and down stairs. (Progressing 08/23/23)                 -  Patient will demonstrate independence with progressive HEP to maximize gains from physical therapy. (MET 08/23/23)                 -  Patient will report max 5/10 pain to aid in completion of ADLs/work duties. (MET 08/23/23)                 -  Patient will demonstrate improved strength of B LE WFL for supine SLR without extension lag to aid in functional transfers. (MET 08/23/23)                 -  Patient will wean to least restrictive assistive device with minimal to no gait deviation to aid community ambulation. (Progressing 08/23/23)          Long-Term Goals: 12 Weeks                  -  Patient will demonstrate improved B knee strength of at least 5/5 to aid in functional transfers. (Progressing 08/23/23)                 -  Patient will demonstrate improved TUG time of maximum 14 seconds with least restrictive assistive device to demonstrate decreased falls risk. (MET 08/23/23)                 -  Patient will perform 5xSTS < 12.6 seconds (MET 08/30/23)                    -  Patient will demonstrate improved functional ability with Patient Specific Scale Score of at least 5. (MET 08/23/23)                 -  Patient will demonstrate ability to ambulate  up and down stairs with reciprocal pattern and no use of UEs to aid in community ambulation.  (Progressing 08/30/23)                 -  Patient will demonstrate improved SLS to at least 5 seconds to decrease risk of falling. (Progressing 08/23/23 as not consistent with RLE).                 Plan:  will continue and progress as able.      Total Session Time 55, Timed code minutes 45, and Untimed code minutes 10  THERAPEUTIC EXERCISE 45 minutes, Ice 10 minutes       Doy Mince, PT  09/04/2023, 10:33

## 2023-09-06 ENCOUNTER — Ambulatory Visit (HOSPITAL_COMMUNITY)
Admission: RE | Admit: 2023-09-06 | Discharge: 2023-09-06 | Disposition: A | Discharge status: Still In Hospital | Payer: Self-pay | Source: Ambulatory Visit

## 2023-09-06 NOTE — PT Treatment (Signed)
 [facility] Medicine [facility]  Outpatient Physical Therapy  [address]  [place], [address]  [phone]  (Fax) [phone]    Physical Therapy Treatment Note    Date: 09/06/2023  Patient's Name: Brandon Wells.  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  States he is a little sore but notes he went to the [employer] yesterday for a short workout.  Notes still some difficulty sleeping and does have some popping in L knee.  Discussed rehab timeline and expectations     Objective:  Activities as noted below.     Measured ROM:   EXERCISE/ACTIVITY NAME REPETITIONS RESISTANCE COMPLETED THIS DOS   HEP Review   Ed on importance of HEP and proper cold pack application  HEP/Access Code: [email or web address]    N  N   Nu-Step  LBE 8 min  X7 min  level 4  Seat position 10 with 9 showing N  y   Heel slides with strap  3 minutes each   N   Quad sets  10xea   N   SLR 15xea   N   Knee extension heel prop 3 minutes    N   SAQ  15x each   N   LAQ  Seated HS curl 2x15 each  10 each  2.5#   blue N  N   Slant board stretch One minute x 2   n   Seated HS curl 10 each   N   Knee flexion stretch on step  5 x 20 sec each   y    Seated heel slides with towel  15 sec x 5    N   Patellar mobs  Scar massage     N  N   Stairs 5x up/down reciprocal    y   Forward step up 15x each 6 inch y   Lateral step up  15x each  6 inch  y   Step-downs 15xea 6 inch  Y   Leg press bilateral  Leg press single 2 x 15   2 x 10 each 100#  60# N  Y    Knee flex/ext S   edge of bed   y   Cold pack post tx 10 min   Y   SPC instruction      n   Double  heel raise  15   n   Prone Quad stretch:  --manual  --with strap        Prone HS curl    2x 10 sec  5x 20 sec        2 x 10 each                2.5#    n  N : HEP 08/23/23     n   Resisted 2 way walk in bars  5 trips each  Exaggerated step Wallace Cullens t-tube  y  STS from chair  Staggered stance sit-stand   Squat to table  2 x 10  2 x 5 each  2 x 5   Y  Y  y   Re-assessment of goals and objectives     n        Assessment: Pt tolerated new exercises well.  He does fatigue with strengthening but overall steady improvement.  Incisions are well healed and pt has minimal swelling     Short-Term Goals: 6 Weeks                  -  Patient will demonstrate improved B knee AROM to at least 0 to 115 degrees to aid in ability to ambulate up and down stairs. (Progressing 08/23/23)                 -  Patient will demonstrate independence with progressive HEP to maximize gains from physical therapy. (MET 08/23/23)                 -  Patient will report max 5/10 pain to aid in completion of ADLs/work duties. (MET 08/23/23)                 -  Patient will demonstrate improved strength of B LE WFL for supine SLR without extension lag to aid in functional transfers. (MET 08/23/23)                 -  Patient will wean to least restrictive assistive device with minimal to no gait deviation to aid community ambulation. (Progressing 08/23/23)          Long-Term Goals: 12 Weeks                  -  Patient will demonstrate improved B knee strength of at least 5/5 to aid in functional transfers. (Progressing 08/23/23)                 -  Patient will demonstrate improved TUG time of maximum 14 seconds with least restrictive assistive device to demonstrate decreased falls risk. (MET 08/23/23)                 -  Patient will perform 5xSTS < 12.6 seconds (MET 08/30/23)                    -  Patient will demonstrate improved functional ability with Patient Specific Scale Score of at least 5. (MET 08/23/23)                 -  Patient will demonstrate ability  to ambulate up and down stairs with reciprocal pattern and no use of UEs to aid in community ambulation.  (Progressing 08/30/23)                 -  Patient will demonstrate improved SLS to at least 5 seconds to decrease risk of falling. (Progressing 08/23/23 as not consistent with RLE).            Plan:  Will continue and progress as tolerated     Total Session Time 43 and Timed code minutes 43  THERAPEUTIC EXERCISE 43 minutes      Tarick Parenteau, PTA  09/06/2023, 10:18

## 2023-09-08 ENCOUNTER — Ambulatory Visit (HOSPITAL_COMMUNITY)
Admission: RE | Admit: 2023-09-08 | Discharge: 2023-09-08 | Disposition: A | Discharge status: Still In Hospital | Payer: Self-pay | Source: Ambulatory Visit

## 2023-09-08 ENCOUNTER — Other Ambulatory Visit: Payer: Self-pay

## 2023-09-08 NOTE — PT Treatment (Addendum)
 [facility]  Outpatient Physical Therapy  [address]  [place], [address]  [phone]  (Fax) [phone]    Physical Therapy Treatment Note    Date: 09/08/2023  Patient's Name: Brandon Wells.  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 He is in no pain at rest. He is noticing he is able to do more at home. He relates he was able to go down 14 steps in his house today the "normal way" vs backwards (which is what he had been doing prior to surgery).  He notes right knee remains more "stiff" than left knee.  Objective:  Activities as noted below.     Measured ROM: NT    EXERCISE/ACTIVITY NAME REPETITIONS RESISTANCE COMPLETED THIS DOS   HEP Review   Ed on importance of HEP and proper cold pack application  HEP/Access Code: [email or web address]    N  N   Nu-Step  LBE 8 min  X7 min  level 4  Seat position 10 with 9 showing N  y   Heel slides with strap  3 minutes each   N   Quad sets  10xea   N   SLR 15xea   N   Knee extension heel prop 3 minutes    N   SAQ  15x each   N   LAQ  Seated HS curl 2x15 each  10 each  2.5#   blue N  N   Slant board stretch One minute x 2   n   Seated HS curl 10 each   N   Knee flexion stretch on step  5 x 20 sec each   y    Seated heel slides with towel  15 sec x 5    N   Patellar mobs  Scar massage     N  N   Stairs 5x up/down reciprocal    y   Forward step up 15x each 6 inch y   Lateral step up  15x each  6 inch  y   Step-downs 15xea 6 inch  Y   Leg press bilateral  Leg press single 2 x 15   2 x 15 each 100#  60# N  Y    Knee  flex/ext S   edge of bed   y   Cold pack post tx 10 min   N    SPC instruction      n   Double heel raise  15   n   Prone Quad stretch:  --manual  --with strap        Prone HS curl    2x 10 sec  5x 20 sec        2 x 10 each                2.5#    n  N : HEP 08/23/23     n   Resisted 2 way walk  in bars  5 trips each  Exaggerated step Martina Sledge t-tube y               STS from chair  Staggered stance sit-stand   Squat to table  2 x 10  2 x 5 each  2 x 5   N  N  N    Re-assessment of goals and objectives     n        Assessment:  Continues to report improving functional ability. Patient tolerates all activities in clinic well with no report of increased pain. Progressing well toward goals.     Short-Term Goals: 6 Weeks                  -  Patient will demonstrate improved B knee AROM to at least 0 to 115 degrees to aid in ability to ambulate up and down stairs. (Progressing 08/23/23)                 -  Patient will demonstrate independence with progressive HEP to maximize gains from physical therapy. (MET 08/23/23)                 -  Patient will report max 5/10 pain to aid in completion of ADLs/work duties. (MET 08/23/23)                 -  Patient will demonstrate improved strength of B LE WFL for supine SLR without extension lag to aid in functional transfers. (MET 08/23/23)                 -  Patient will wean to least restrictive assistive device with minimal to no gait deviation to aid community ambulation. (Progressing 08/23/23)          Long-Term Goals: 12 Weeks                  -  Patient will demonstrate improved B knee strength of at least 5/5 to aid in functional transfers. (Progressing 08/23/23)                 -  Patient will demonstrate improved TUG time of maximum 14 seconds with least restrictive assistive device to demonstrate decreased falls risk. (MET 08/23/23)                 -  Patient will perform 5xSTS < 12.6 seconds (MET 08/30/23)                    -  Patient will demonstrate improved functional ability with Patient  Specific Scale Score of at least 5. (MET 08/23/23)                 -  Patient will demonstrate ability to ambulate up and down stairs with reciprocal pattern and no use of UEs to aid in community ambulation.  (Progressing 08/30/23)                 -  Patient will demonstrate improved SLS to at least 5 seconds to decrease risk of falling. (Progressing 08/23/23 as not consistent with RLE).            Plan:  Will continue and progress as tolerated     Total Session Time 36 and Timed code minutes 36  THERAPEUTIC EXERCISE 36 minutes      Macenzie Burford, PTA  09/08/2023 10:23

## 2023-09-11 ENCOUNTER — Ambulatory Visit (HOSPITAL_COMMUNITY)
Admission: RE | Admit: 2023-09-11 | Discharge: 2023-09-11 | Disposition: A | Discharge status: Still In Hospital | Payer: Self-pay | Source: Ambulatory Visit

## 2023-09-11 NOTE — PT Treatment (Signed)
 [facility]  Outpatient Physical Therapy  [address]  [place], [address]  [phone]  (Fax) [phone]    Physical Therapy Treatment Note    Date: 09/11/2023  Patient's Name: Brandon Wells.  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  Notes his knees are feeling better.  Fatigue today but states he has been on his feet a lot.  States pain 3-4/10 at worst over past week.  At rest he just notices that his knee are dull ache.      Objective:  Activities as noted below.     Measured ROM:         EXERCISE/ACTIVITY NAME REPETITIONS RESISTANCE COMPLETED THIS DOS   HEP Review   Ed on importance of HEP and proper cold pack application  HEP/Access Code: [email or web address]    N  N   Nu-Step  LBE 7 min  X7 min  level 4  Seat position 10 with 9 showing y  n   Heel slides with strap  3 minutes each   N   Quad sets  10xea   N   SLR 15xea   N   Knee extension heel prop 3 minutes    N   SAQ  15x each   N   LAQ  Seated HS curl 2x15 each  10 each  2.5#   blue N  N   Slant board stretch One minute x 2   n   Seated HS curl 10 each   N   Knee flexion stretch on step  5 x 20 sec each   y    Seated heel slides with towel  15 sec x 5    N   Patellar mobs  Scar massage     N  N   Stairs 5x up/down reciprocal    n   Forward step up 15x each 6 inch y   Lateral step up  15x each  6 inch  y   Step-downs 15xea 6 inch  Y   Leg press bilateral  Leg press single  Hip abduction machine 2 x 15   2 x 15 each  15  15 100#  60#  40#  30# y  Y   Y    Knee flex/ext S   edge of bed   y    Cold pack post tx 10 min   N    SPC instruction      n   Double heel raise  15   n   Prone Quad stretch:  --manual  --with strap        Prone HS curl    2x 10 sec  5x 20 sec        2 x 10 each                2.5#    n  N : HEP 08/23/23  n   Resisted 2 way walk in bars  [address] 1 trip   Exaggerated step Martina Sledge t-tube y                       STS from chair  Staggered stance sit-stand   Squat to table  2 x 10  2 x 5 each  2 x 5   N  N  N    Re-assessment of goals and objectives     n          Assessment:  Pt tolerated treatment well.  Steady improvement with overall function and strength.  ROM improved with very slight limitation at end ranges      Short-Term Goals: 6 Weeks                  -  Patient will demonstrate improved B knee AROM to at least 0 to 115 degrees to aid in ability to ambulate up and down stairs. (Progressing 08/23/23)                 -  Patient will demonstrate independence with progressive HEP to maximize gains from physical therapy. (MET 08/23/23)                 -  Patient will report max 5/10 pain to aid in completion of ADLs/work duties. (MET 08/23/23)                 -  Patient will demonstrate improved strength of B LE WFL for supine SLR without extension lag to aid in functional transfers. (MET 08/23/23)                 -  Patient will wean to least restrictive assistive device with minimal to no gait deviation to aid community ambulation. (Progressing 08/23/23)          Long-Term Goals: 12 Weeks                  -  Patient will demonstrate improved B knee strength of at least 5/5 to aid in functional transfers. (Progressing 08/23/23)                 -  Patient will demonstrate improved TUG time of maximum 14 seconds with least restrictive assistive device to demonstrate decreased falls risk. (MET 08/23/23)                 -  Patient will perform 5xSTS < 12.6 seconds (MET 08/30/23)                    -  Patient will demonstrate improved functional ability with Patient Specific Scale Score of at least 5.  (MET 08/23/23)                 -  Patient will demonstrate ability to ambulate up and down stairs with reciprocal pattern and no use of UEs to aid in community ambulation.  (Progressing 08/30/23)                 -  Patient will demonstrate improved SLS to at least 5 seconds to decrease risk of falling. (Progressing 08/23/23 as not consistent with RLE).              Plan:  Will remeasure ROM next visit.  Continue and progress as tolerated     Total Session Time 38 and Timed code minutes 38  THERAPEUTIC  EXERCISE 38 minutes      Alajia Schmelzer, PTA  09/11/2023, 14:45

## 2023-09-13 ENCOUNTER — Ambulatory Visit (HOSPITAL_COMMUNITY)
Admission: RE | Admit: 2023-09-13 | Discharge: 2023-09-13 | Disposition: A | Discharge status: Still In Hospital | Payer: Self-pay | Source: Ambulatory Visit

## 2023-09-13 NOTE — PT Treatment (Signed)
 [facility]  Outpatient Physical Therapy  [address]  [place], [address]  [phone]  (Fax) [phone]    Physical Therapy Treatment Note    Date: 09/13/2023  Patient's Name: Brandon Wells.  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  States he did not take pain pill today before therapy.  States he is starting to come down stairs reciprocally.  Notes he still has some discomfort but not really pain.  Pt rates pain 3/10 today.  Notes more discomfort vs pain and states mostly in RLE.      Objective:  Activities as noted below.     Problem Score   1. Walking pain free  4   Total 4   Total score = sum of the activity scores/number of activities    Minimal detectable change (90% CI) for avg score = 2 points    Minimal detectable change (90% CI) for single activity score = 3 points         A/PROM                       AROM EOB    Eval 08/02/23 AAROM  08/09/23  08/21/23 08/28/23  AAROM 09/13/23  AAROM     Right  -  Left Right  -   Left R                 L R                L R              L R  L   Knee Flexion 68 / 73    70/76    80            90 93              94 92              102 115         113 118  115   Knee Extension 8                 5     10            7 8                  1   3               1            EXERCISE/ACTIVITY NAME REPETITIONS RESISTANCE COMPLETED THIS DOS   HEP Review   Ed on importance of HEP and proper cold pack application  HEP/Access Code: [email or web address]    N  N   Nu-Step  LBE 10 min  X7 min  level 4  Seat position 10 with 9 showing y  n   Heel slides with strap  3  minutes each   [employer]  10xea   N   SLR 15xea   N   Knee extension heel prop 3 minutes    N   SAQ  15x each   N   LAQ  Seated HS curl 2x15 each  10 each  2.5#   blue N  Y    Slant board stretch One minute x 2   n          Knee flexion stretch on step  5 x 20 sec each   y    Seated heel slides with towel  15 sec x 5    N   Patellar mobs  Scar massage     N  N   Stairs 5x up/down reciprocal    n   Forward step up 15x each 6 inch n   Lateral step up  15x each  6 inch  n   Step-downs 15xea 6 inch  n   Leg press bilateral  Leg press single  Hip abduction machine 2 x 15   2 x 15 each  15  15 100#  60#  40#  30# N  N  N     Knee flex/ext S   edge of bed   y   Cold pack post tx 10 min   Y    SPC instruction      n   Double heel raise  15   n   Prone Quad stretch:  --manual  --with strap        Prone HS curl    2x 10 sec  5x 20 sec        2 x 10 each                2.5#    n  N : HEP 08/23/23     n   Resisted 2 way walk in bars  5 trips each in bars.  Martina Sledge t-tube y    lateral walk   5 trips each in bars  gray t-tube  y    step up/over forward   Step up/over lateral   15 each  15 each  6 inch step in bars  y  Y    STS from chair  Staggered stance sit-stand   Squat to table  2 x 10  2 x 5 each  2 x 5   N  N  N    Re-assessment of goals and objectives     n           Assessment: Pt tolerated treatment well.  He is steadily improving with overall function and strength.  Still slight limitation in ROM at end ranges but is functional.  Swelling still present but not severe.      Short-Term Goals: 6 Weeks                  -  Patient will demonstrate improved B knee AROM to at least 0 to 115 degrees to aid in ability to ambulate up and down stairs. (Progressing 08/23/23)                 -  Patient will demonstrate independence with progressive HEP to maximize gains from physical therapy. (MET 08/23/23)                 -  Patient will report max 5/10 pain to aid in completion of ADLs/work duties. (MET 08/23/23)                 -  Patient will demonstrate improved strength of B LE WFL for supine SLR without extension lag to aid in functional transfers. (MET 08/23/23)                 -  Patient will wean to least restrictive assistive device with minimal to no gait deviation to aid community ambulation. (Progressing 08/23/23)          Long-Term Goals: 12 Weeks                  -  Patient will demonstrate improved B knee strength of at least 5/5 to aid in functional transfers. (Progressing 08/23/23)                 -  Patient will demonstrate improved TUG time of maximum 14 seconds with least restrictive assistive device to demonstrate decreased falls risk. (MET 08/23/23)                 -  Patient will perform 5xSTS < 12.6 seconds (MET 08/30/23)                    -  Patient will demonstrate improved functional ability with Patient Specific Scale Score of at least 5. (MET 08/23/23)                 -  Patient will demonstrate ability to ambulate up and down stairs with reciprocal pattern and no use of UEs to aid in community ambulation.  (Progressing 08/30/23)                 -  Patient will demonstrate improved SLS to at least 5 seconds to decrease risk of falling. (Progressing 08/23/23 as not consistent with RLE).              Plan:  Will continue and progress as tolerated     Total Session Time 55, Timed code minutes 45, and Untimed code minutes 10  THERAPEUTIC EXERCISE 45 minutes      Ellen Goris, PTA  09/13/2023, 10:10

## 2023-09-15 ENCOUNTER — Ambulatory Visit (HOSPITAL_COMMUNITY)
Admission: RE | Admit: 2023-09-15 | Discharge: 2023-09-15 | Disposition: A | Discharge status: Still In Hospital | Payer: Self-pay | Source: Ambulatory Visit

## 2023-09-15 ENCOUNTER — Other Ambulatory Visit: Payer: Self-pay

## 2023-09-15 NOTE — PT Treatment (Signed)
 [facility]  Outpatient Physical Therapy  [address]  [place], [address]  [phone]  (Fax) [phone]    Physical Therapy Treatment Note    Date: 09/15/2023  Patient's Name: Brandon Wells.  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 He had gone about 4 days without any pain medication but did end up taking medicine before coming to PT. Pain is better now. He rates pain today 4/10.    Objective:  Activities as noted below.     Problem Score   1. Walking pain free  4   Total 4   Total score = sum of the activity scores/number of activities    Minimal detectable change (90% CI) for avg score = 2 points    Minimal detectable change (90% CI) for single activity score = 3 points         A/PROM                       AROM EOB    Eval 08/02/23 AAROM  08/09/23  08/21/23 08/28/23  AAROM 09/13/23  AAROM     Right  -  Left Right  -   Left R                 L R                L R              L R  L   Knee Flexion 68 / 73    70/76    80            90 93              94 92              102 115         113 118  115   Knee Extension 8                 5     10            7 8                  1   3               1            EXERCISE/ACTIVITY NAME REPETITIONS RESISTANCE COMPLETED THIS DOS   HEP Review   Ed on importance of HEP and proper cold pack application  HEP/Access Code: [email or web address]    N  N   Nu-Step  LBE 10 min  X7 min  level 4  Seat position 10 with 9 showing y  n   Heel slides with strap  3 minutes each   N   Quad sets  10xea    N   SLR 15xea  N   Knee extension heel prop 3 minutes    N   SAQ  15x each   N   LAQ  Seated HS curl 2x15 each  10 each  2.5#   blue N  Y    Slant board stretch One minute x 2   n          Knee flexion stretch on step  3 x 20 sec each   y    Seated heel slides with towel  15 sec x 5    N   Patellar mobs  Scar massage     N  N   Stairs 5x up/down reciprocal    n   Forward step up 15x each 6 inch n   Lateral step up  15x each  6 inch  n   Step-downs 15xea 6 inch  n   Leg press bilateral  Leg press single  Hip abduction machine 2 x 15   2 x 15 each  15  15 100#  60#  40#  30# N  N  N     Knee flex/ext S  5 x 20   y   Cold pack post tx 10 min   Y    SPC instruction      n   Double heel raise  15   n   Prone Quad stretch:  --manual  --with strap        Prone HS curl    2x 10 sec  5x 20 sec        2 x 10 each                2.5#    n  N : HEP 08/23/23     n   Resisted 2 way walk in bars  5 trips each in bars.  Gray t-tube y    lateral walk   5 trips each in bars  gray t-tube  y    step up/over forward   Step up/over lateral   15 each  15 each  6 inch step in bars  y  Y    STS from chair  Staggered stance sit-stand   Squat to table  2 x 10  2 x 5 each  2 x 5   N  N  N    Re-assessment of goals and objectives     n           Assessment:  Continues to progress toward meeting goals. Tolerated session well with no increase in pain or adverse effects reported.     Short-Term Goals: 6 Weeks                  -  Patient will demonstrate improved B knee AROM to at least 0 to 115 degrees to aid in ability to ambulate up and down stairs. (Progressing 08/23/23)                 -  Patient will demonstrate independence with progressive HEP to maximize gains from physical therapy. (MET 08/23/23)                 -  Patient will report max 5/10 pain to aid in completion of ADLs/work duties. (MET 08/23/23)                 -  Patient will demonstrate improved strength of B LE WFL for supine SLR without extension lag to aid  in functional transfers.  (MET 08/23/23)                 -  Patient will wean to least restrictive assistive device with minimal to no gait deviation to aid community ambulation. (Progressing 08/23/23)          Long-Term Goals: 12 Weeks                  -  Patient will demonstrate improved B knee strength of at least 5/5 to aid in functional transfers. (Progressing 08/23/23)                 -  Patient will demonstrate improved TUG time of maximum 14 seconds with least restrictive assistive device to demonstrate decreased falls risk. (MET 08/23/23)                 -  Patient will perform 5xSTS < 12.6 seconds (MET 08/30/23)                    -  Patient will demonstrate improved functional ability with Patient Specific Scale Score of at least 5. (MET 08/23/23)                 -  Patient will demonstrate ability to ambulate up and down stairs with reciprocal pattern and no use of UEs to aid in community ambulation.  (Progressing 08/30/23)                 -  Patient will demonstrate improved SLS to at least 5 seconds to decrease risk of falling. (Progressing 08/23/23 as not consistent with RLE).              Plan:  Will continue and progress as tolerated     Total Session Time 45, Timed code minutes 35, and Untimed code minutes 10  THERAPEUTIC EXERCISE 35 minutes      Legrand Lasser, PTA  09/15/2023 11:03

## 2023-09-18 ENCOUNTER — Other Ambulatory Visit: Payer: Self-pay

## 2023-09-18 ENCOUNTER — Ambulatory Visit
Admission: RE | Admit: 2023-09-18 | Discharge: 2023-09-18 | Disposition: A | Discharge status: Still In Hospital | Payer: Self-pay | Source: Ambulatory Visit | Attending: Orthopaedic Surgery | Admitting: Orthopaedic Surgery

## 2023-09-18 NOTE — PT Treatment (Signed)
 [facility]  Outpatient Physical Therapy  [address]  [place], [address]  [phone]  (Fax) [phone]    Physical Therapy Treatment Note    Date: 09/18/2023  Patient's Name: Brandon Wells.  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 Some discomfort with activity. Continues with some difficulty sleeping but anticipates this may continue for a while.     Objective:  Activities as noted below.     Problem Score   1. Walking pain free  4   Total 4   Total score = sum of the activity scores/number of activities    Minimal detectable change (90% CI) for avg score = 2 points    Minimal detectable change (90% CI) for single activity score = 3 points         A/PROM                       AROM EOB    Eval 08/02/23 AAROM  08/09/23  08/21/23 08/28/23  AAROM 09/13/23  AAROM     Right  -  Left Right  -   Left R                 L R                L R              L R  L   Knee Flexion 68 / 73    70/76    80            90 93              94 92              102 115         113 118  115   Knee Extension 8                 5     10            7 8                  1   3               1            EXERCISE/ACTIVITY NAME REPETITIONS RESISTANCE COMPLETED THIS DOS   HEP Review   Ed on importance of HEP and proper cold pack application  HEP/Access Code: [email or web address]    N  N   Nu-Step  LBE 10 min  X7 min  level 5  Seat position 10 with 9 showing y  n   Heel slides with strap  3 minutes each   N   Quad sets  10xea   N   SLR 15xea   N   Knee extension heel prop 3 minutes    N   SAQ  15x  each   N   LAQ  Seated HS curl 2x15 each  10 each  2.5#   blue Y  N   Slant board stretch One minute x 2   n          Knee flexion stretch on step  3 x 20 sec each   y    Seated heel slides with towel  15 sec x 5    N   Patellar mobs  Scar massage     N  N   Stairs 5x up/down reciprocal    n   Forward step up 15x each 6 inch n   Lateral step up  15x each  6 inch  n   Step-downs 15xea 6 inch  n   Leg press bilateral  Leg press single  Hip abduction machine 2 x 15   2 x 15 each  15  15 100#  60#  40#  30# N  N  N     Knee flex/ext S  5 x 20   y   Cold pack post tx 10 min   Y    SPC instruction      n   Double heel raise  15   n   Prone Quad stretch:  --manual  --with strap        Prone HS curl    2x 10 sec  5x 20 sec        2 x 10 each                2.5#    n  N : HEP 08/23/23     n   Resisted 2 way walk in bars  5 trips each in bars.  Martina Sledge t-tube y    lateral walk   5 trips each in bars  gray t-tube y    step up/over forward   Step up/over lateral  20 each  20 each  6 inch step in bars y  Y    STS from chair  Staggered stance sit-stand   Squat to table  2 x 10  2 x 5 each  2 x 5   N  N  N    Re-assessment of goals and objectives     n           Assessment: Improving overall function with decreased pain levels. Patient is ambulatory without assistive device with minimal gait deviation. Anticipate he will be ready for discharge soon.     Short-Term Goals: 6 Weeks                  -  Patient will demonstrate improved B knee AROM to at least 0 to 115 degrees to aid in ability to ambulate up and down stairs. (Progressing 08/23/23)                 -  Patient will demonstrate independence with progressive HEP to maximize gains from physical therapy. (MET 08/23/23)                 -  Patient will report max 5/10 pain to aid in completion of ADLs/work duties. (MET 08/23/23)                 -  Patient will demonstrate improved strength of B LE WFL for supine SLR without extension lag to aid in functional transfers. (MET 08/23/23)                  -  Patient will wean to least restrictive assistive device with minimal to no gait deviation to aid community ambulation. (MET 09/18/23)        Long-Term Goals: 12 Weeks                  -  Patient will demonstrate improved B knee strength of at least 5/5 to aid in functional transfers. (Progressing 08/23/23)                 -  Patient will demonstrate improved TUG time of maximum 14 seconds with least restrictive assistive device to demonstrate decreased falls risk. (MET 08/23/23)                 -  Patient will perform 5xSTS < 12.6 seconds (MET 08/30/23)                    -  Patient will demonstrate improved functional ability with Patient Specific Scale Score of at least 5. (MET 08/23/23)                 -  Patient will demonstrate ability to ambulate up and down stairs with reciprocal pattern and no use of UEs to aid in community ambulation.  (Progressing 08/30/23)                 -  Patient will demonstrate improved SLS to at least 5 seconds to decrease risk of falling. (Progressing 08/23/23 as not consistent with RLE).              Plan:  Re-assessment with PT next session.     Total Session Time 41, Timed code minutes 41, and Untimed code minutes 0  THERAPEUTIC EXERCISE 41 minutes      Joyice Magda, PTA  09/18/2023 11:03

## 2023-09-20 ENCOUNTER — Ambulatory Visit
Admission: RE | Admit: 2023-09-20 | Discharge: 2023-09-20 | Disposition: A | Discharge status: Still In Hospital | Payer: Self-pay | Source: Ambulatory Visit | Attending: Orthopaedic Surgery | Admitting: Orthopaedic Surgery

## 2023-09-20 ENCOUNTER — Other Ambulatory Visit: Payer: Self-pay

## 2023-09-20 DIAGNOSIS — Z96653 Presence of artificial knee joint, bilateral: Secondary | ICD-10-CM | POA: Insufficient documentation

## 2023-09-20 NOTE — PT Treatment (Signed)
 [facility]  Outpatient Physical Therapy  [address]  [place], [address]  [phone]  (Fax) [phone]    Physical Therapy Discharge Note    Date: 09/20/2023  Patient's Name: Brandon Wells.  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 Patient reports he was able to walk around [employer] 3-4 hours without having pain. Patient continues to note improved tolerance to sleeping.      Objective:  Activities as noted below.      Problem Score IE  09/20/23   1. Walking pain free  0 8   Total 0 8   Total score = sum of the activity scores/number of activities    Minimal detectable change (90% CI) for avg score = 2 points    Minimal detectable change (90% CI) for single activity score = 3 points          A/PROM                       AROM EOB    Eval 08/02/23 AAROM  08/09/23  08/21/23 08/28/23  AAROM 09/20/23     Right  -  Left Right  -   Left R                 L R                L R              L R  L   Knee Flexion 68 / 73    70/76    80            90 93              94 92              102 115         113 116  117   Knee Extension 8                 5     10            7 8                  1   3               1 1  1          Strength       Right  3/12 Left 4/2 Left 3/12 Left 4/2   Knee flexion  4+/5 5/5 4+/5 5/5   Knee extension  4+/5 5/5 4+/5 5/5         Functional Testing     08/30/23  09/20/23   5xSTS 7.81 5.57   TUG 7.47 NT               EXERCISE/ACTIVITY NAME REPETITIONS RESISTANCE COMPLETED THIS DOS   HEP Review  Ed on importance of HEP and proper cold  pack application  HEP/Access Code: [email or web address]    N  N   Nu-Step  LBE 10 min  X7 min  level 5  Seat position 10 with 9 showing y  n   Heel slides with strap  3 minutes each   N   Quad sets  10xea   N   SLR 15xea   N   Knee extension heel prop 3 minutes    N   SAQ  15x each   N   LAQ  Seated HS curl 2x15 each  10 each  2.5#   blue Y  N   Slant board stretch One minute x 2   n             Knee flexion stretch on step  3 x 20 sec each   y    Seated heel slides with towel  15 sec x 5    N   Patellar mobs  Scar massage     N  N   Stairs 5x up/down reciprocal    n   Forward step up 15x each 6 inch n   Lateral step up  15x each  6 inch  n   Step-downs 15xea 6 inch  n   Leg press bilateral  Leg press single  Hip abduction machine 2 x 15   2 x 15 each  15  15 100#  60#  40#  30# N  N  N      Knee flex/ext S  5 x 20   y   Cold pack post tx 10 min   Y    SPC instruction      n   Double heel raise  15   n   Prone Quad stretch:  --manual  --with strap        Prone HS curl    2x 10 sec  5x 20 sec        2 x 10 each                2.5#    n  N : HEP 08/23/23     n   Resisted 2 way walk in bars  5 trips each in bars.  Martina Sledge t-tube y    lateral walk   5 trips each in bars  gray t-tube y    step up/over forward   Step up/over lateral  20 each  20 each  6 inch step in bars y  Y    STS from chair  Staggered stance sit-stand   Squat to table  2 x 10  2 x 5 each  2 x 5   N  N  N    Re-assessment of goals and objectives     Y    Access Code: [email or web address]  URL: [email or web address]  Date: 09/20/2023  Prepared by: Floy Hutch    Exercises  - Standing Knee Flexion Stretch on Step  - 1 x daily - 7 x weekly - 1 sets - 3-5 reps - 30 seconds hold  - Standing Hamstring Stretch with Step  - 1 x daily - 7 x weekly - 1 sets - 3-5 reps - 30 seconds hold  - Standing Gastroc Stretch on Step  - 1 x daily - 7 x weekly - 1 sets - 2-3 reps - 45-60 seconds hold  - Prone Quadriceps Stretch with Strap  - 1 x daily -  7 x weekly - 1 sets - 10 reps - 10-15 seconds  hold  - Sit to Stand Without Arm Support  - 1 x daily - 3-5 x weekly - 2 sets - 10 reps  - Staggered Sit-to-Stand  - 1 x daily - 3-5 x weekly - 2 sets - 10 reps  - Step Up  - 1 x daily - 3-5 x weekly - 2 sets - 10 reps  - Lateral Step Up  - 1 x daily - 3-5 x weekly - 2 sets - 10 reps  - Forward Step Down  - 1 x daily - 3-5 x weekly - 2 sets - 10 reps        Assessment: Patient has met all established short and long term PT goals at this time. Patient demos significant improvements to B knee AROM, gross muscle strength, and functional strength. Patient demos good tolerance to functional mobility with no AD.  Patient presents as minimal fall risk at this time. Patient is appropriate for D/C to self management at this time. Updated and expanded HEP created and reviewed with patient this date.      Short-Term Goals: 6 Weeks                  -  Patient will demonstrate improved B knee AROM to at least 0 to 115 degrees to aid in ability to ambulate up and down stairs. (MET 09/20/23)                 -  Patient will demonstrate independence with progressive HEP to maximize gains from physical therapy. (MET 08/23/23)                 -  Patient will report max 5/10 pain to aid in completion of ADLs/work duties. (MET 08/23/23)                 -  Patient will demonstrate improved strength of B LE WFL for supine SLR without extension lag to aid in functional transfers. (MET 08/23/23)                 -  Patient will wean to least restrictive assistive device with minimal to no gait deviation to aid community ambulation. (MET 09/18/23)        Long-Term Goals: 12 Weeks                  -  Patient will demonstrate improved B knee strength of at least 5/5 to aid in functional transfers. (MET 09/20/23)                 -  Patient will demonstrate improved TUG time of maximum 14 seconds with least restrictive assistive device to demonstrate decreased falls risk. (MET 08/23/23)                 -  Patient will perform 5xSTS < 12.6 seconds (MET  08/30/23)                    -  Patient will demonstrate improved functional ability with Patient Specific Scale Score of at least 5. (MET 08/23/23)                 -  Patient will demonstrate ability to ambulate up and down stairs with reciprocal pattern and no use of UEs to aid in community ambulation.  (MET 09/20/23)                 -  Patient will demonstrate improved SLS to at least 5 seconds to decrease risk of falling. (MET 09/20/23 SLS 10 seconds each ).       Plan:  D/C to HEP    Total Session Time 40 and Timed code minutes 40  THERAPEUTIC EXERCISE 40 minutes      Floy Hutch, PT  09/20/2023, 10:58

## 2023-09-22 ENCOUNTER — Ambulatory Visit (HOSPITAL_COMMUNITY): Payer: Self-pay

## 2023-09-25 ENCOUNTER — Ambulatory Visit (HOSPITAL_COMMUNITY): Payer: Self-pay

## 2023-09-27 ENCOUNTER — Ambulatory Visit (HOSPITAL_COMMUNITY): Payer: Self-pay

## 2023-09-29 ENCOUNTER — Ambulatory Visit (HOSPITAL_COMMUNITY): Payer: Self-pay

## 2023-10-09 ENCOUNTER — Encounter (INDEPENDENT_AMBULATORY_CARE_PROVIDER_SITE_OTHER): Payer: Self-pay | Admitting: Internal Medicine

## 2023-10-19 ENCOUNTER — Ambulatory Visit: Payer: Self-pay | Attending: Internal Medicine | Admitting: Internal Medicine

## 2023-10-19 ENCOUNTER — Other Ambulatory Visit: Payer: Self-pay

## 2023-10-19 ENCOUNTER — Encounter (INDEPENDENT_AMBULATORY_CARE_PROVIDER_SITE_OTHER): Payer: Self-pay | Admitting: Internal Medicine

## 2023-10-19 VITALS — BP 90/59 | HR 65 | Ht 67.0 in | Wt 171.6 lb

## 2023-10-19 DIAGNOSIS — N401 Enlarged prostate with lower urinary tract symptoms: Secondary | ICD-10-CM | POA: Insufficient documentation

## 2023-10-19 DIAGNOSIS — Z8679 Personal history of other diseases of the circulatory system: Secondary | ICD-10-CM | POA: Insufficient documentation

## 2023-10-19 DIAGNOSIS — R5382 Chronic fatigue, unspecified: Secondary | ICD-10-CM | POA: Insufficient documentation

## 2023-10-19 DIAGNOSIS — E7800 Pure hypercholesterolemia, unspecified: Secondary | ICD-10-CM | POA: Insufficient documentation

## 2023-10-19 DIAGNOSIS — R351 Nocturia: Secondary | ICD-10-CM | POA: Insufficient documentation

## 2023-10-19 DIAGNOSIS — I480 Paroxysmal atrial fibrillation: Secondary | ICD-10-CM | POA: Insufficient documentation

## 2023-10-19 DIAGNOSIS — I2102 ST elevation (STEMI) myocardial infarction involving left anterior descending coronary artery: Secondary | ICD-10-CM | POA: Insufficient documentation

## 2023-10-19 DIAGNOSIS — Z0000 Encounter for general adult medical examination without abnormal findings: Secondary | ICD-10-CM | POA: Insufficient documentation

## 2023-10-19 DIAGNOSIS — I10 Essential (primary) hypertension: Secondary | ICD-10-CM | POA: Insufficient documentation

## 2023-10-19 DIAGNOSIS — R739 Hyperglycemia, unspecified: Secondary | ICD-10-CM | POA: Insufficient documentation

## 2023-10-19 DIAGNOSIS — Z7901 Long term (current) use of anticoagulants: Secondary | ICD-10-CM

## 2023-10-19 DIAGNOSIS — Z9989 Dependence on other enabling machines and devices: Secondary | ICD-10-CM

## 2023-10-19 DIAGNOSIS — M25512 Pain in left shoulder: Secondary | ICD-10-CM | POA: Insufficient documentation

## 2023-10-19 DIAGNOSIS — G4733 Obstructive sleep apnea (adult) (pediatric): Secondary | ICD-10-CM | POA: Insufficient documentation

## 2023-10-19 DIAGNOSIS — I5043 Acute on chronic combined systolic (congestive) and diastolic (congestive) heart failure: Secondary | ICD-10-CM | POA: Insufficient documentation

## 2023-10-19 MED ORDER — LIDOCAINE (PF) 10 MG/ML (1 %) INJECTION SOLUTION
2.0000 mL | Freq: Once | INTRAMUSCULAR | Status: AC
Start: 2023-10-19 — End: 2023-10-19
  Administered 2023-10-19: 40 mg via INTRAMUSCULAR

## 2023-10-19 MED ORDER — TRIAMCINOLONE ACETONIDE 40 MG/ML SUSPENSION FOR INJECTION
40.0000 mg | INTRAMUSCULAR | Status: AC
Start: 2023-10-19 — End: 2023-10-19
  Administered 2023-10-19: 40 mg via INTRA_ARTICULAR

## 2023-10-19 NOTE — Progress Notes (Signed)
 [facility], [facility]  [address]  [place] [address]  Operated by [facility]     Name: Brandon Wells. MRN:  [id number]   Date: 10/19/2023 Age: 73 y.o.       Chief Complaint:    Chief Complaint   Patient presents with    Medicare Annual    Shoulder Injury     C/o left shoulder pain.  Would like to see if he can get an injection for that.         HPI:  Albeiro Trompeter. is a 73 y.o. male who presents to the office today for follow-up.  Patient is having some minor issues as described below which I have addressed in total with the patient.        Past Medical History:  Past Medical History:   Diagnosis Date    Allergic rhinitis     Arthritis     Back problem     CPAP (continuous positive airway pressure) dependence     Esophageal reflux     Hypercholesterolemia     Hyperlipidemia     Hypertension     Myocardial infarction     Peripheral edema     Personal history of venous thrombosis and embolism     Renal calculi     Sleep apnea     Stented coronary artery     Wears glasses          Past Surgical History:   Procedure Laterality Date    ACHILLES TENDON REPAIR Left     CATARACT EXTRACTION Bilateral     CYSTOSCOPY W/ RETROGRADES      HX CORONARY STENT PLACEMENT      HX HERNIA REPAIR Bilateral     inguinal    HX HIP REPLACEMENT Left 09/28/2022      Current Outpatient Medications   Medication Sig    atorvastatin (LIPITOR) 80 mg Oral Tablet Take 1 Tablet (80 mg total) by mouth Every night    clopidogreL  (PLAVIX ) 75 mg Oral Tablet Take 1 Tablet (75 mg total) by mouth Daily    furosemide  (LASIX ) 20 mg Oral Tablet Take 1 Tablet (20 mg total) by mouth Once a day    lisinopriL  (PRINIVIL ) 5 mg Oral Tablet Take 1 Tablet (5 mg total) by mouth Once a day    metoprolol  succinate (TOPROL -XL) 25 mg Oral Tablet Sustained Release 24 hr Take 1 Tablet (25 mg total) by mouth Daily    mometasone  (NASONEX) 50 mcg/actuation Nasal Spray, Non-Aerosol ADMINISTER 2 SPRAYS INTO  AFFECTED NOSTRIL(S) ONCE PER DAY AS NEEDED     Allergies   Allergen Reactions    Hydrocodone  Rash    Prednisone   Other Adverse Reaction (Add comment)     Insomnia       Family History:  Family Medical History:       Problem Relation (Age of Onset)    Bone cancer Father    Esophageal cancer Mother    Heart Disease Brother    Lung Cancer Mother              Social History:   Social History     Tobacco Use   Smoking Status Never   Smokeless Tobacco Never     Social History     Substance and Sexual Activity   Alcohol  Use Not Currently    Comment: occasionally     Social History     Occupational History  Not on file       Review of Systems:  Review of systems as discussed in HPI    Problem List:  Patient Active Problem List   Diagnosis    STEMI (ST elevation myocardial infarction)    Coronary artery disease involving native coronary artery of native heart without angina pectoris    Volume overload    Systolic and diastolic CHF, 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)    OSA on CPAP    Hypoxia    History of placement of stent in LAD coronary artery    History of atrial fibrillation    High cholesterol    Essential hypertension    PAF (paroxysmal atrial fibrillation) (CMS HCC)    Breast pain, left    Status post total hip replacement, right    Acute pain of left shoulder    Palpitations    Ganglion and cyst of synovium, tendon and bursa    Osteoarthritis    S/P TKR (total knee replacement), bilateral       Physical Examination:  BP (!) 90/59 (Site: Right Arm, Patient Position: Sitting, Cuff Size: Adult)   Pulse 65   Ht 1.702 m (5\' 7" )   Wt 77.8 kg (171 lb 9.6 oz)   SpO2 95%   BMI 26.88 kg/m       Physical Exam  Vitals and nursing note reviewed.   Constitutional:       General: He is not in acute distress.     Appearance: Normal appearance.   HENT:      Head: Normocephalic.      Right Ear: Tympanic membrane normal.      Left Ear: Tympanic membrane normal.      Nose: Nose normal.      Mouth/Throat:      Mouth: Mucous membranes  are moist.   Eyes:      General: No scleral icterus.     Extraocular Movements: Extraocular movements intact.      Conjunctiva/sclera: Conjunctivae normal.      Pupils: Pupils are equal, round, and reactive to light.   Neck:      Vascular: No carotid bruit.   Cardiovascular:      Rate and Rhythm: Normal rate and regular rhythm.      Pulses: Normal pulses.           Dorsalis pedis pulses are 2+ on the right side and 2+ on the left side.        Posterior tibial pulses are 2+ on the right side and 2+ on the left side.      Heart sounds: Normal heart sounds.   Pulmonary:      Effort: Pulmonary effort is normal.      Breath sounds: Normal breath sounds. No wheezing, rhonchi or rales.   Abdominal:      General: Abdomen is flat. Bowel sounds are normal.      Tenderness: There is no abdominal tenderness. There is no guarding.   Musculoskeletal:         General: No swelling.      Left shoulder: Deformity, tenderness and crepitus present. Decreased range of motion. Decreased strength.      Cervical back: Normal range of motion. No tenderness.      Right lower leg: No edema.      Left lower leg: No edema.   Skin:     General: Skin is warm.      Capillary Refill: Capillary refill takes less than 2 seconds.  Neurological:      General: No focal deficit present.      Mental Status: He is alert and oriented to person, place, and time.      Cranial Nerves: No cranial nerve deficit.              Health Maintenance:  Health Maintenance   Topic Date Due    Influenza Vaccine (Season Ended) 02/19/2024    Depression Screening  10/18/2024    Fecal DNA Test (Cologuard)  03/28/2026    Medicare Annual Wellness Visit - Calendar Year Insurers  Completed    Adult Tdap-Td  Discontinued    Hepatitis C screening  Discontinued    Shingles Vaccine  Discontinued    Covid-19 Vaccine  Discontinued    RSV Adult 60+ or Pregnancy  Discontinued    Pneumococcal Vaccination, Age 39+  Discontinued        Assessment:    I have reviewed the most recent labs  with the patient and all questions answered to patients satisfaction.       ICD-10-CM    1. Encounter for Medicare annual wellness exam  Z00.00 Exam Performed      2. PAF (paroxysmal atrial fibrillation) (CMS HCC)  I48.0 Labs ordered here and on this visit will be be addressed by me. I will contact patient and address therapy options once completed.     LIPID PANEL      3. History of atrial fibrillation  Z86.79 Condition stable will continue current therapy.        4. Essential hypertension  I10 Blood Pressure today is stable and will continue current treatment plans     CBC     URINALYSIS, MACROSCOPIC AND MICROSCOPIC W/CULTURE REFLEX     COMPREHENSIVE METABOLIC PANEL, NON-FASTING      5. ST elevation myocardial infarction involving left anterior descending (LAD) coronary artery (CMS HCC)  I21.02 Labs ordered here and on this visit will be be addressed by me. I will contact patient and address therapy options once completed.       6. 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 I will contact patient and address therapy options once completed.       7. OSA on CPAP  G47.33 The patient continues to use their CPAP every evening and is compliant with its use.  They continue to benefit from CPAP use and has had improve lifestyle with this.       8. Acute pain of left shoulder  M25.512 Large Joint Arthrocentesis: L glenohumeral     lidocaine  PF (XYLOCAINE -MPF) 1% injection     triamcinolone  acetonide (KENALOG -40) 40 mg/mL injection      9. Chronic fatigue  R53.82 Labs ordered here and on this visit will be be addressed by me. I will contact patient and address therapy options once completed.     THYROID  STIMULATING HORMONE WITH FREE T4 REFLEX      10. BPH associated with nocturia  N40.1 Labs ordered here and on this visit will be be addressed by me. I will contact patient and address therapy options once completed.     PSA, DIAGNOSTIC     R35.1       11. High cholesterol  E78.00 Labs ordered here and on this visit will be be addressed by me. I will contact patient and address therapy options once completed.     LIPID PANEL  12. Elevated blood sugar  R73.9 Labs ordered here and on this visit will be be addressed by me. I will contact patient and address therapy options once completed.     HGA1C (HEMOGLOBIN A1C WITH EST AVG GLUCOSE)         Orders Placed This Encounter    Large Joint Arthrocentesis: L glenohumeral    CBC    LIPID PANEL    URINALYSIS, MACROSCOPIC AND MICROSCOPIC W/CULTURE REFLEX    COMPREHENSIVE METABOLIC PANEL, NON-FASTING    THYROID  STIMULATING HORMONE WITH FREE T4 REFLEX    PSA, DIAGNOSTIC    HGA1C (HEMOGLOBIN A1C WITH EST AVG GLUCOSE)    lidocaine  PF (XYLOCAINE -MPF) 1% injection    triamcinolone  acetonide (KENALOG -40) 40 mg/mL injection     Medications Discontinued During This Encounter   Medication Reason    prednisolone/moxiflox/bromf/PF (PREDNISOLON-MOXIFLOX-BROMF,PF, OPHT) Medication Reconciliation    HYDROcodone -acetaminophen  (NORCO) 7.5-325 mg Oral Tablet Medication Reconciliation    acetaminophen -codeine (TYLENOL  #3) 300-30 mg Oral Tablet Medication Reconciliation        Depression screening is negative. PHQ 2 Total: 0       Follow up:  Return in about 4 months (around 02/19/2024).    This note was partially created using voice recognition software and is inherently subject to errors including those of syntax and "sound alike " substitutions which may escape proof reading.  In such instances, original meaning may be extrapolated by contextual derivation.    Celeste Cola, DO  10/19/2023, 09:29    [facility], [facility]  [address]  [place] [facility] [address]  Operated by [facility]  Medicare Annual Wellness Visit    Name: Brandon Wells. MRN:  [id number]   Date: 10/19/2023 Age: 73 y.o.       SUBJECTIVE:   Darrius Montano. is a 73 y.o. male for presenting for Medicare  Wellness exam.   I have reviewed and reconciled the medication list with the patient today.        10/19/2023     9:12 AM 10/25/2022     9:33 AM   Comprehensive Health Assessment-Adult   Do you wish to complete this form? Yes Yes   During the past 4 weeks, how would you rate your health in general? Good Good   During the past 4 weeks, how much difficulty have you had doing your usual activities inside and outside your home because of medical or emotional problems? A little bit of difficulty No difficulty at all   During the past 4 weeks, was someone available to help you if you needed and wanted help? Yes, as much as I wanted Yes, as much as I wanted   In the past year, how many times have you gone to the emergency department or been admitted to a hospital for a health problem? 1 time       bilateral knee replacements 2-4 times   Are you generally satisfied with your sleep? Yes Yes   Do you have enough money to buy things you need in everyday life, such as food, clothing, medicines, and housing? Yes, always Yes, always   Can you get to places beyond walking distance without help?  (For example, can you drive your own car or travel alone on buses)? Yes Yes   Do you fasten your seatbelt when you are in a car? Yes, usually Yes, usually   Do you exercise 20 minutes 3 or more days per week (such as  walking, dancing, biking, mowing grass, swimming)? Yes, most of the time No, I usually don't exercise this much   How often do you eat food that is healthy (fruits, vegetables, lean meats) instead of unhealthy (sweets, fast food, junk food, fatty foods)? Most of the time Most of the time   Have your parents, brothers or sisters had any of the following problems before the age of 60? (check all that apply) Heart problems, or hardening of the arteries;Cancer;High cholesterol;Mental health problems such as depression, bipolar, severe anxiety, postpartum depression;Alcohol  or drug addiction (or abuse)    How often do you have trouble  taking medicines the eay you are told to take them? I always take them as prescribed I always take them as prescribed   Do you need any help communicating with your doctors and nurses because of vision or hearing problems? No No   During the past 12 months, have you experienced confusion or memory loss that is happening more often or is getting worse? No No   Do you have one person you think of as your personal doctor (primary care provider or family doctor)? Yes Yes   If you are seeing a Primary Care Provider (PCP) or family doctor. please list their name Dr. Darel Ebbs Dr. Darel Ebbs   Are you now also seeing any specialist physician(s) (such as eye doctor, foot doctor, skin doctor)? Yes Yes   If you are seeing a specialist for anything such as foot, eye, skin, etc.  please list their name(s) EYES, Cardiology--Orthopedic--   How confident are you that you can control or manage most of your health problems? Very confident Very confident       I have reviewed and updated as appropriate the past medical, family and social history. 10/19/2023 as summarized below:  Past Medical History:   Diagnosis Date    Allergic rhinitis     Arthritis     Back problem     CPAP (continuous positive airway pressure) dependence     Esophageal reflux     Hypercholesterolemia     Hyperlipidemia     Hypertension     Myocardial infarction     Peripheral edema     Personal history of venous thrombosis and embolism     Renal calculi     Sleep apnea     Stented coronary artery     Wears glasses      Past Surgical History:   Procedure Laterality Date    Achilles tendon repair Left     Cataract extraction Bilateral     Cystoscopy w/ retrogrades      Hx coronary stent placement      Hx hernia repair Bilateral     Hx hip replacement Left 09/28/2022     Current Outpatient Medications   Medication Sig    atorvastatin (LIPITOR) 80 mg Oral Tablet Take 1 Tablet (80 mg total) by mouth Every night    clopidogreL  (PLAVIX ) 75 mg Oral Tablet Take 1 Tablet  (75 mg total) by mouth Daily    furosemide  (LASIX ) 20 mg Oral Tablet Take 1 Tablet (20 mg total) by mouth Once a day    lisinopriL  (PRINIVIL ) 5 mg Oral Tablet Take 1 Tablet (5 mg total) by mouth Once a day    metoprolol  succinate (TOPROL -XL) 25 mg Oral Tablet Sustained Release 24 hr Take 1 Tablet (25 mg total) by mouth Daily    mometasone  (NASONEX) 50 mcg/actuation Nasal Spray, Non-Aerosol ADMINISTER 2 SPRAYS INTO AFFECTED  NOSTRIL(S) ONCE PER DAY AS NEEDED     Family Medical History:       Problem Relation (Age of Onset)    Bone cancer Father    Esophageal cancer Mother    Heart Disease Brother    Lung Cancer Mother            Social History     Socioeconomic History    Marital status: Married   Tobacco Use    Smoking status: Never    Smokeless tobacco: Never   Vaping Use    Vaping status: Never Used   Substance and Sexual Activity    Alcohol  use: Not Currently     Comment: occasionally    Drug use: Never     Social Determinants of Health     Financial Resource Strain: Low Risk  (08/09/2022)    Financial Resource Strain     SDOH Financial: No   Transportation Needs: Low Risk  (08/09/2022)    Transportation Needs     SDOH Transportation: No   Social Connections: Low Risk  (07/26/2023)    Social Connections     SDOH Social Isolation: 5 or more times a week   Intimate Partner Violence: Low Risk  (08/09/2022)    Intimate Partner Violence     SDOH Domestic Violence: No   Housing Stability: Low Risk  (08/09/2022)    Housing Stability     SDOH Housing Situation: I have housing.     SDOH Housing Worry: No   Health Literacy: Medium Risk (07/26/2023)    Health Literacy     SDOH Health Literacy: Occasionally   Employment Status: Low Risk  (08/09/2022)    Employment Status     SDOH Employment: Otherwise unemployed but not seeking work (ex. Consulting civil engineer, retired, disabled, unpaid primary care giver)         List of Current Health Care Providers   Care Team       PCP       Name Type Specialty Phone Number    Celeste Cola, DO Physician  INTERNAL MEDICINE [phone]              Care Team       No care team found                      Health Maintenance   Topic Date Due    Influenza Vaccine (Season Ended) 02/19/2024    Depression Screening  10/18/2024    Fecal DNA Test (Cologuard)  03/28/2026    Medicare Annual Wellness Visit - Calendar Year Insurers  Completed    Adult Tdap-Td  Discontinued    Hepatitis C screening  Discontinued    Shingles Vaccine  Discontinued    Covid-19 Vaccine  Discontinued    RSV Adult 60+ or Pregnancy  Discontinued    Pneumococcal Vaccination, Age 50+  Discontinued     Medicare Wellness Assessment   Medicare initial or wellness physical in the last year?: No  Advance Directives   Does patient have a living will or MPOA: No           Advance directive information given to the patient today?: Yes      Activities of Daily Living   Do you need help with dressing, bathing, or walking?: No   Do you need help with shopping, housekeeping, medications, or finances?: No   Do you have rugs in hallways, broken steps, or poor lighting?: No   Do you  have grab bars in your bathroom, non-slip strips in your tub, and hand rails on your stairs?: Yes   Cognitive Function Screen (1=Yes, 0=No)   What is you age?: Correct   What is the time to the nearest hour?: Correct   What is the year?: Correct   What is the name of this clinic?: Correct   Can the patient recognize two persons (the doctor, the nurse, home help, etc.)?: Correct   What is the date of your birth? (day and month sufficient) : Correct   In what year did World War II end?: Incorrect   Who is the current president of the [place] ?: Correct   Count from 20 down to 1?: Correct   What address did I give you earlier?: Correct   Total Score: 9       Fall Risk Screen   Do you feel unsteady when standing or walking?: No  Do you worry about falling?: No  Have you fallen in the past year?: No   Depression Screen     Little interest or pleasure in doing things.: Not at all  Feeling  down, depressed, or hopeless: Not at all  PHQ 2 Total: 0     Pain Score   Pain Score:   2 (knees)    Substance Use-Abuse Screening     Tobacco Use     In Past 12 MONTHS, how often have you used any tobacco product (for example, cigarettes, e-cigarettes, cigars, pipes, or smokeless tobacco)?: Never     Alcohol  use     In the PAST 12 MONTHS, how often have you had 5 (men)/4 (women) or more drinks containing alcohol  in one day?: Never     Prescription Drug Use     In the PAST 12 months, how often have you used any prescription medications just for the feeling, more than prescribed, or that were not prescribed for you? Prescriptions may include: opioids, benzodiazepines, medications for ADHD: Never           Illicit Drug Use   In the PAST 12 MONTHS, how often have you used any drugs, including marijuana, cocaine or crack, heroin, methamphetamine, hallucinogens, ecstasy/MDMA?: Never            Urine Incontinence Screen   Urinary Incontinence Screen  Do you ever leak urine when you don't want to?: No       OBJECTIVE:   BP (!) 90/59 (Site: Right Arm, Patient Position: Sitting, Cuff Size: Adult)   Pulse 65   Ht 1.702 m (5\' 7" )   Wt 77.8 kg (171 lb 9.6 oz)   SpO2 95%   BMI 26.88 kg/m        Other appropriate exam:    There are no preventive care reminders to display for this patient.   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) (Chronic)    History of atrial fibrillation (Chronic)    Essential hypertension (Chronic)    Relevant Orders    CBC    URINALYSIS, MACROSCOPIC AND MICROSCOPIC W/CULTURE REFLEX    COMPREHENSIVE METABOLIC PANEL, NON-FASTING    High cholesterol    Relevant Orders    LIPID PANEL    PAF (paroxysmal atrial fibrillation) (CMS HCC)    Relevant Orders    LIPID PANEL  Respiratory    OSA on CPAP (Chronic)       Musculoskeletal    Acute pain  of left shoulder    Relevant Medications    lidocaine  PF (XYLOCAINE -MPF) 1% injection (Start on 10/19/2023 11:00 AM)    triamcinolone  acetonide (KENALOG -40) 40 mg/mL injection (Start on 10/19/2023 10:00 AM)    Other Relevant Orders    Large Joint Arthrocentesis: L glenohumeral     Other Visit Diagnoses         Encounter for Medicare annual wellness exam  (Chronic)   -  Primary      Chronic fatigue        Relevant Orders    THYROID  STIMULATING HORMONE WITH FREE T4 REFLEX      BPH associated with nocturia        Relevant Orders    PSA, DIAGNOSTIC      Elevated blood sugar        Relevant Orders    HGA1C (HEMOGLOBIN A1C WITH EST AVG GLUCOSE)             Identified Risk Factors/ Recommended Actions       The PHQ 2 Total: 0 depression screen is interpreted as negative.    Advanced Directives: Patient agreeable to - Advanced Directives discussed and patient elected to take home to review.      Orders Placed This Encounter    Large Joint Arthrocentesis: L glenohumeral    CBC    LIPID PANEL    URINALYSIS, MACROSCOPIC AND MICROSCOPIC W/CULTURE REFLEX    COMPREHENSIVE METABOLIC PANEL, NON-FASTING    THYROID  STIMULATING HORMONE WITH FREE T4 REFLEX    PSA, DIAGNOSTIC    HGA1C (HEMOGLOBIN A1C WITH EST AVG GLUCOSE)    lidocaine  PF (XYLOCAINE -MPF) 1% injection    triamcinolone  acetonide (KENALOG -40) 40 mg/mL injection          The patient has been educated about risk factors and recommended preventive care. Written Prevention Plan completed/ updated and given to patient (see After Visit Summary).    Return in about 4 months (around 02/19/2024).    Celeste Cola, DO  10/19/2023 09:45

## 2023-10-19 NOTE — Patient Instructions (Signed)
 Medicare Preventive Services  Medicare coverage information Recommendation for YOU   Heart Disease and Diabetes   Lipid profile every 5 years or more often if at risk for cardiovascular disease  Lab Results   Component Value Date    CHOLESTEROL 107 07/20/2022    HDLCHOL 40 07/20/2022    LDLCHOL 45 07/20/2022    LDLCHOLDIR 12 08/24/2021    TRIG 110 07/20/2022       Diabetes Screening with Blood Glucose test or Glucose Tolerance Test Yearly for those at risk for diabetes, up to two tests per year for those with prediabetes  Last Glucose: 282     Diabetes Self-Management Training   Initial training ten hours per year, and follow-up training two hours per subsequent year. Optional for those with diabetes    Medical Nutrition Therapy   Three hours of one-on-one counseling in first year, two hours in subsequent years. Optional for those with diabetes, kidney disease   Intensive Behavioral Therapy for Obesity  Face-to-face counseling, first month every week, month 2-6 every other week, month 7-12 every month if continued progress is documented Optional for those with Body Mass Index 30 or higher  Your Body mass index is 26.88 kg/m.   Tobacco Cessation (Quitting) Counseling   Two attempts per year, max 4 sessions per attempt, up to 8 sessions per year Optional for those who use tobacco    Cancer Screening Last Completion Date   Colorectal screening   For anyone age 6 to 28 or any age if high risk:  Screening Colonoscopy every 10 yrs if low risk,  more frequent if higher risk  OR  Cologuard Stool DNA test once every 3 years OR  Fecal Occult Blood Testing yearly OR  Flexible  Sigmoidoscopy  every 5 yr OR  CT Colonography every 5 yrs  --03/29/2023  See below for due date if applicable.   Prostate Cancer Screening  Prostate Specific Antigen blood test based on joint decision making with your provider for ages 48-69  A joint decision between you and your primary care provider   Lung Cancer Screening  Annual low dose computed  tomography (LDCT scan) is recommended for those age 31-80 who smoked 20 pack-years and are current smokers or quit smoking within past 15 years, after counseling by your doctor or nurse clinician about the possible benefits or harms.   See below for due date if applicable.   Vaccinations   Respiratory syncytial virus (RSV)  Age 2 years or older: Based on shared clinical decision-making with your provider.  Pneumococcal Vaccine  Recommended routinely age 43+ with one or two separate vaccines based on your risk. Recommended before age 79 if medical conditions with increased risk  Seasonal Influenza Vaccine  Once every flu season   Hepatitis B Vaccine  3 doses if risk (including anyone with diabetes or liver disease)  Shingles Vaccine  Two doses at age 9 or older  Diphtheria Tetanus Pertussis Vaccine  ONCE as adult, booster every 10 years     Immunization History   Administered Date(s) Administered    Covid-19 Vaccine,Pfizer-BioNTech,Purple Top,69yrs+ 07/26/2019, 08/16/2019, 09/23/2020     Shingles vaccine and Diphtheria Tetanus Pertussis vaccines are available at pharmacies or local health department without a prescription.   Other Preventative Screening  Last Completion Date   Glaucoma Screening   Yearly if in high risk group such as diabetes, family history, African American age 46+ or Hispanic American age 43+ See your Eye Care Provider   Hepatitis  C Screening   Recommended  for those born between ages 18-79 years.   See below for due date if applicable.     HIV Testing  Recommended routinely at least ONCE, covered every year for age 24 to 34 regardless of risk, and every year for age over 38 who ask for the test or higher risk. Yearly or up to 3 times in pregnancy    See below for due date if applicable.  Bone Densitometry   Screening is recommended for Men ages 28 and above with one or more risk factor: androgen deprivation therapy for prostate cancer, hypogonadism, frailty, primary hyperparathyroidism,  hyperthyroidism  For men diagnosed with osteoporosis, follow up is recommended every years or a frequency recommended by your provider     See below for due date if applicable.   Abdominal Aortic Aneurysm Screening Ultrasound   Once with a family history of abdominal aortic aneurysms OR a male between 33-75 and have smoked at least 100 cigarettes in your lifetime.     See below for due date if applicable.       Your Personalized Schedule for Preventive Tests     Health Maintenance: Pending and Last Completed         Date Due Completion Date    Influenza Vaccine (Season Ended) 02/19/2024 ---    Depression Screening 10/18/2024 10/19/2023    Fecal DNA Test (Cologuard) 03/28/2026 03/29/2023                For Information on Advanced Directives for Health Care:  [place]:  [email or web address]  [place], [place], [place], [place] General Information: [email or web address]

## 2023-10-19 NOTE — Procedures (Signed)
 [facility]  [address]  [place] [facility] [address]  Operated by [facility]  Procedure Note    Name: Philip Kotlyar. MRN:  [id number]   Date: 10/19/2023 DOB:  1950/12/22 (72 y.o.)         Large Joint Arthrocentesis: L glenohumeral  Indications: pain  Details: 22 G needle, posterior approach  Outcome: tolerated well, no immediate complications  Procedure, treatment alternatives, risks and benefits explained, specific risks discussed. Consent was given by the patient. Immediately prior to procedure a time out was called to verify the correct patient, procedure, equipment, support staff and site/side marked as required. Patient was prepped and draped in the usual sterile fashion.           Celeste Cola, DO

## 2023-11-03 ENCOUNTER — Other Ambulatory Visit (INDEPENDENT_AMBULATORY_CARE_PROVIDER_SITE_OTHER): Payer: Self-pay | Admitting: Internal Medicine

## 2023-11-03 DIAGNOSIS — I10 Essential (primary) hypertension: Secondary | ICD-10-CM

## 2023-11-15 ENCOUNTER — Other Ambulatory Visit (INDEPENDENT_AMBULATORY_CARE_PROVIDER_SITE_OTHER): Payer: Self-pay | Admitting: Internal Medicine

## 2023-11-15 DIAGNOSIS — I10 Essential (primary) hypertension: Secondary | ICD-10-CM

## 2024-02-20 ENCOUNTER — Ambulatory Visit (INDEPENDENT_AMBULATORY_CARE_PROVIDER_SITE_OTHER): Payer: Self-pay | Admitting: Internal Medicine

## 2024-04-02 ENCOUNTER — Encounter (INDEPENDENT_AMBULATORY_CARE_PROVIDER_SITE_OTHER): Payer: Self-pay | Admitting: NURSE PRACTITIONER

## 2024-04-02 ENCOUNTER — Ambulatory Visit: Attending: Internal Medicine | Admitting: NURSE PRACTITIONER

## 2024-04-02 ENCOUNTER — Other Ambulatory Visit: Payer: Self-pay

## 2024-04-02 VITALS — BP 151/73 | HR 73 | Temp 98.9°F | Resp 18 | Ht 67.01 in | Wt 171.0 lb

## 2024-04-02 DIAGNOSIS — R062 Wheezing: Secondary | ICD-10-CM | POA: Insufficient documentation

## 2024-04-02 DIAGNOSIS — J069 Acute upper respiratory infection, unspecified: Secondary | ICD-10-CM | POA: Insufficient documentation

## 2024-04-02 DIAGNOSIS — R0602 Shortness of breath: Secondary | ICD-10-CM | POA: Insufficient documentation

## 2024-04-02 MED ORDER — ALBUTEROL SULFATE 2.5 MG/3 ML (0.083 %) SOLUTION FOR NEBULIZATION
2.5000 mg | INHALATION_SOLUTION | Freq: Once | RESPIRATORY_TRACT | Status: AC
Start: 2024-04-02 — End: 2024-04-02
  Administered 2024-04-02: 2.5 mg via RESPIRATORY_TRACT

## 2024-04-02 MED ORDER — DEXAMETHASONE SODIUM PHOSPHATE 4 MG/ML INJECTION SOLUTION
8.0000 mg | INTRAMUSCULAR | Status: AC
Start: 2024-04-02 — End: 2024-04-02
  Administered 2024-04-02: 8 mg via INTRAMUSCULAR

## 2024-04-02 MED ORDER — LIDOCAINE HCL 10 MG/ML (1 %) INJECTION SOLUTION
1.0000 g | INTRAMUSCULAR | Status: AC
Start: 2024-04-02 — End: 2024-04-02
  Administered 2024-04-02: 1 g via INTRAMUSCULAR

## 2024-04-02 MED ORDER — ALBUTEROL SULFATE 2.5 MG/3 ML (0.083 %) SOLUTION FOR NEBULIZATION
2.5000 mg | INHALATION_SOLUTION | RESPIRATORY_TRACT | 0 refills | Status: AC | PRN
Start: 2024-04-02 — End: ?

## 2024-04-02 MED ORDER — CEFDINIR 300 MG CAPSULE
300.0000 mg | ORAL_CAPSULE | Freq: Two times a day (BID) | ORAL | 0 refills | Status: AC
Start: 2024-04-02 — End: 2024-04-09

## 2024-04-02 NOTE — Progress Notes (Signed)
 [facility]  [address]  [place] [address]  Operated by [facility]    Acute Office Visit      NAME: Brandon Wells. DOS: 04/02/2024   MRN: [id number] DOB: 10-Jun-1951     Chief Complaint: Cough and Congestion      History of Present Illness:   History of Present Illness  Taison Celani. is a 73 year old male who presents with shortness of breath.    He experiences significant shortness of breath, which worsened notably last night, and he was unable to lie down. Attempting to take a deep breath triggers coughing.    He attempted to use a nebulizer at home, but it did not provide relief. He has a nebulizer machine at home, originally for his grandson, but lacks the appropriate adult attachments.    He is allergic to prednisone , which causes insomnia, but he tolerates other steroids such as Decadron . No known allergies to antibiotics.    He reports difficulty breathing and a tendency to cough when trying to take deep breaths.      No other acute concerns or complaints at this time.    Medical History     I have reviewed and updated as appropriate the past medical, surgical, family, and social history today:  Medical History/Surgical History/Family History/Social History    Allergies:  Allergies[1]    Medications:  Current Outpatient Medications   Medication Sig    albuterol  sulfate (PROVENTIL ) 2.5 mg /3 mL (0.083 %) Inhalation nebulizer solution Take 3 mL (2.5 mg total) by nebulization Every 4 hours as needed for Wheezing Indications: bronchospasm prevention    atorvastatin (LIPITOR) 80 mg Oral Tablet Take 1 Tablet (80 mg total) by mouth Every night    cefdinir (OMNICEF) 300 mg Oral Capsule Take 1 Capsule (300 mg total) by mouth Twice daily for 7 days    clopidogreL  (PLAVIX ) 75 mg Oral Tablet Take 1 Tablet (75 mg total) by mouth Daily    furosemide  (LASIX ) 20 mg Oral Tablet TAKE 1 TABLET BY MOUTH ONCE A DAY    lisinopriL  (PRINIVIL ) 5 mg Oral  Tablet TAKE 1 TABLET BY MOUTH ONCE A DAY    metoprolol  succinate (TOPROL -XL) 25 mg Oral Tablet Sustained Release 24 hr Take 1 Tablet (25 mg total) by mouth Daily    mometasone  (NASONEX) 50 mcg/actuation Nasal Spray, Non-Aerosol ADMINISTER 2 SPRAYS INTO AFFECTED NOSTRIL(S) ONCE PER DAY AS NEEDED       Problem List:  Patient Active Problem List    Diagnosis Date Noted    S/P TKR (total knee replacement), 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    Osteoarthritis 07/26/2023    Ganglion and cyst of synovium, tendon and bursa 03/17/2023    Palpitations 01/19/2023    Acute pain of left shoulder 11/16/2022    Status post total hip replacement, 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    Breast pain, left 08/09/2022    PAF (paroxysmal atrial fibrillation) 01/18/2022    Volume overload 01/14/2022    OSA on CPAP 01/14/2022    Hypoxia 01/14/2022    History of placement of stent in LAD coronary artery 01/14/2022    High cholesterol 01/14/2022    Essential hypertension 01/14/2022    History of atrial fibrillation 10/19/2016    Coronary artery disease involving native coronary artery of native heart without angina pectoris 09/07/2016    Systolic and diastolic CHF, 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   Constitutional:       General: He is not in acute distress.     Appearance: Normal appearance. He is ill-appearing.   HENT:      Head: Normocephalic and atraumatic.   Cardiovascular:      Rate and Rhythm: Normal rate and regular rhythm.      Heart sounds: S1 normal and S2 normal. No murmur heard.  Pulmonary:      Effort: Pulmonary effort is normal.      Breath sounds: Examination of the right-upper field reveals wheezing. Examination of the left-upper field reveals wheezing and  rhonchi. Examination of the right-lower field reveals wheezing. Examination of the left-lower field reveals wheezing. Decreased breath sounds, wheezing and rhonchi present.   Abdominal:      General: Bowel sounds are normal.      Palpations: Abdomen is soft.   Musculoskeletal:         General: Normal range of motion.   Skin:     General: Skin is warm and dry.      Capillary Refill: Capillary refill takes less than 2 seconds.   Neurological:      General: No focal deficit present.      Mental Status: He is alert.   Psychiatric:         Mood and Affect: Mood normal.            Assessment/Plan     Diagnosis/Plan:  Problem List Items Addressed This Visit    None  Visit Diagnoses         Acute upper respiratory infection    -  Primary    Relevant Medications    cefTRIAXone (ROCEPHIN) 1 g in lidocaine  2.86 mL (tot vol) IM injection    cefdinir (OMNICEF) 300 mg Oral Capsule    albuterol  sulfate (PROVENTIL ) 2.5 mg /3 mL (0.083 %) Inhalation nebulizer solution      Wheezing        Relevant Medications    dexAMETHasone  4 mg/mL injection    albuterol  (PROVENTIL ) 2.5 mg / 3 mL (0.083%) neb solution    albuterol  (PROVENTIL ) 2.5 mg / 3 mL (0.083%) neb solution    albuterol  sulfate (PROVENTIL ) 2.5 mg /3 mL (0.083 %) Inhalation nebulizer solution      Shortness of breath        Relevant Medications    dexAMETHasone  4 mg/mL injection    albuterol  (PROVENTIL ) 2.5 mg / 3 mL (0.083%) neb solution    albuterol  (PROVENTIL ) 2.5 mg / 3 mL (0.083%) neb solution    albuterol  sulfate (PROVENTIL ) 2.5 mg /3 mL (0.083 %) Inhalation nebulizer solution             Encounter Medications and Orders  Orders Placed This Encounter    dexAMETHasone  4 mg/mL injection    cefTRIAXone (ROCEPHIN) 1 g in lidocaine  2.86 mL (tot vol) IM injection    albuterol  (PROVENTIL ) 2.5 mg / 3 mL (0.083%) neb solution    cefdinir (OMNICEF) 300 mg Oral Capsule    albuterol  (PROVENTIL ) 2.5 mg / 3 mL (0.083%) neb solution    albuterol  sulfate (PROVENTIL ) 2.5 mg /  3 mL (0.083  %) Inhalation nebulizer solution       Return if symptoms worsen or fail to improve.    Assessment & Plan  Acute upper respiratory infection with wheezing/shortness of breath.   Severe exacerbation with significant dyspnea and poor air movement. Allergic to prednisone , tolerates Decadron .  - Administered Decadron  injection in office.  - Administered nebulizer treatment in office.  - Prescribed antibiotics to be filled at [facility] in [place].  - Prescribed nebulizer solution for home use.  - Instructed to take Mucinex OTC twice daily at home  - Increase oral fluids.     Rosina Linsey, APRN, CNP  04/02/2024 12:48     This note was partially created using M*Modal fluency direct system (voice recognition software ) and is inherently subject to errors including those of syntax and "sound- alike substitutions which may escape proofreading.  In such instances, original meaning may be extrapolated by contextual derivation..         [1]   Allergies  Allergen Reactions    Hydrocodone  Rash    Prednisone   Other Adverse Reaction (Add comment)     Insomnia

## 2024-04-02 NOTE — Progress Notes (Signed)
 I discussed the patient's care with the Resident prior to the patient leaving the clinic. Any significant discussion points are noted.    Rosina Linsey, APRN, CNP 04/02/2024, 16:41

## 2024-04-05 ENCOUNTER — Other Ambulatory Visit: Payer: Self-pay

## 2024-04-05 ENCOUNTER — Ambulatory Visit: Attending: NURSE PRACTITIONER | Admitting: NURSE PRACTITIONER

## 2024-04-05 ENCOUNTER — Encounter (INDEPENDENT_AMBULATORY_CARE_PROVIDER_SITE_OTHER): Payer: Self-pay | Admitting: NURSE PRACTITIONER

## 2024-04-05 VITALS — BP 129/71 | HR 62 | Resp 18 | Ht 67.01 in | Wt 171.0 lb

## 2024-04-05 DIAGNOSIS — J069 Acute upper respiratory infection, unspecified: Secondary | ICD-10-CM | POA: Insufficient documentation

## 2024-04-05 DIAGNOSIS — R062 Wheezing: Secondary | ICD-10-CM | POA: Insufficient documentation

## 2024-04-05 MED ORDER — DEXAMETHASONE SODIUM PHOSPHATE 4 MG/ML INJECTION SOLUTION
8.0000 mg | INTRAMUSCULAR | Status: AC
Start: 2024-04-05 — End: 2024-04-05
  Administered 2024-04-05: 8 mg via INTRAMUSCULAR

## 2024-04-05 MED ORDER — ALBUTEROL SULFATE HFA 90 MCG/ACTUATION AEROSOL INHALER
1.0000 | INHALATION_SPRAY | Freq: Four times a day (QID) | RESPIRATORY_TRACT | 1 refills | Status: AC | PRN
Start: 2024-04-05 — End: ?

## 2024-04-05 NOTE — Progress Notes (Signed)
 [facility]  [address]  [place] [address]  Operated by [facility]    Acute Office Visit      NAME: Brandon Wells. DOS: 04/05/2024   MRN: [id number] DOB: 1950/10/12     Chief Complaint: Congestion      History of Present Illness:   History of Present Illness  Brandon Wells. is a 73 year old male who presents with persistent cough and difficulty sleeping.    Cough and respiratory symptoms  - Persistent cough, worsened when lying down  - Previously unable to breathe without coughing  - Significant improvement in symptoms since last visit  - Uses breathing treatments regularly  - Has an inhaler at home from prior COVID infection, but did not receive a new inhaler as expected  - Currently taking antibiotics (Omnicef) and feels clearer than before  - Cannot take oral steroids but has been receiving steroid injections  - No current fever    Sleep disturbance  - Difficulty sleeping due to cough  - Uses CPAP machine  - Has had to sleep sitting up to alleviate symptoms  - Last night had the best sleep in a while, but still required sitting up    Fatigue and malaise  - Lack of energy and feeling unwell  - Hardly got out of a chair yesterday    Recent disease trajectory and concerns  - Felt better the day after last visit but experienced a setback the following day  - Concerned about potentially needing to visit the ER if condition worsens over the weekend      No other acute concerns or complaints at this time.    Medical History     I have reviewed and updated as appropriate the past medical, surgical, family, and social history today:  Medical History/Surgical History/Family History/Social History    Allergies:  Allergies[1]    Medications:  Current Outpatient Medications   Medication Sig    albuterol  sulfate (PROVENTIL ) 2.5 mg /3 mL (0.083 %) Inhalation nebulizer solution Take 3 mL (2.5 mg total) by nebulization Every 4 hours as needed for  Wheezing Indications: bronchospasm prevention    albuterol  sulfate (VENTOLIN  HFA) 90 mcg/actuation Inhalation oral inhaler Take 1-2 Puffs by inhalation Every 6 hours as needed Indications: bronchospasm prevention    atorvastatin (LIPITOR) 80 mg Oral Tablet Take 1 Tablet (80 mg total) by mouth Every night    cefdinir (OMNICEF) 300 mg Oral Capsule Take 1 Capsule (300 mg total) by mouth Twice daily for 7 days    clopidogreL  (PLAVIX ) 75 mg Oral Tablet Take 1 Tablet (75 mg total) by mouth Daily    furosemide  (LASIX ) 20 mg Oral Tablet TAKE 1 TABLET BY MOUTH ONCE A DAY    lisinopriL  (PRINIVIL ) 5 mg Oral Tablet TAKE 1 TABLET BY MOUTH ONCE A DAY    metoprolol  succinate (TOPROL -XL) 25 mg Oral Tablet Sustained Release 24 hr Take 1 Tablet (25 mg total) by mouth Daily    mometasone  (NASONEX) 50 mcg/actuation Nasal Spray, Non-Aerosol ADMINISTER 2 SPRAYS INTO AFFECTED NOSTRIL(S) ONCE PER DAY AS NEEDED       Problem List:  Patient Active Problem List    Diagnosis Date Noted    S/P TKR (total knee replacement), 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    Osteoarthritis 07/26/2023    Ganglion and cyst of synovium, tendon and bursa 03/17/2023    Palpitations 01/19/2023    Acute pain of left shoulder 11/16/2022  Status post total hip replacement, 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    Breast pain, left 08/09/2022    PAF (paroxysmal atrial fibrillation) 01/18/2022    Volume overload 01/14/2022    OSA on CPAP 01/14/2022    Hypoxia 01/14/2022    History of placement of stent in LAD coronary artery 01/14/2022    High cholesterol 01/14/2022    Essential hypertension 01/14/2022    History of atrial fibrillation 10/19/2016    Coronary artery disease involving native coronary artery of native heart without angina pectoris 09/07/2016    Systolic and diastolic CHF, 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   Constitutional:       General: He is not in acute distress.     Appearance: Normal appearance. He is not ill-appearing or diaphoretic.   HENT:      Head: Normocephalic and atraumatic.   Cardiovascular:      Rate and Rhythm: Normal rate and regular rhythm.      Heart sounds: S1 normal and S2 normal. No murmur heard.  Pulmonary:      Effort: Pulmonary effort is normal.      Breath sounds: Examination of the right-lower field reveals wheezing. Examination of the left-lower field reveals wheezing. Wheezing present. No rhonchi.   Abdominal:      General: Bowel sounds are normal.      Palpations: Abdomen is soft.   Skin:     General: Skin is warm and dry.      Capillary Refill: Capillary refill takes less than 2 seconds.   Neurological:      General: No focal deficit present.      Mental Status: He is alert.   Psychiatric:         Mood and Affect: Mood normal.            Assessment/Plan     Diagnosis/Plan:  Problem List Items Addressed This Visit    None  Visit Diagnoses         Wheezing    -  Primary    Relevant Medications    dexAMETHasone  4 mg/mL injection (Start on 04/05/2024 12:00 PM)    albuterol  sulfate (VENTOLIN  HFA) 90 mcg/actuation Inhalation oral inhaler      Acute upper respiratory infection        Relevant Medications    dexAMETHasone  4 mg/mL injection (Start on 04/05/2024 12:00 PM)    albuterol  sulfate (VENTOLIN  HFA) 90 mcg/actuation Inhalation oral inhaler               Encounter Medications and Orders  Orders Placed This Encounter    dexAMETHasone  4 mg/mL injection    albuterol  sulfate (VENTOLIN  HFA) 90 mcg/actuation Inhalation oral inhaler       Return if symptoms worsen or fail to improve.    Assessment & Plan  Acute lower respiratory tract infection with wheezing and cough  Wheezing and cough with nocturnal symptoms. Significant improvement with antibiotics and nebulizer. Persistent wheezing but improved lung  auscultation. Clinical improvement negates need for chest x-ray.  - Administer steroid injection for wheezing.  - Ensure completion of antibiotic  course.  - Continue nebulizer treatments.  - Resend prescription for rescue inhaler.  - Advise to contact office if symptoms persist by Monday for potential chest x-ray.    Obstructive sleep apnea on CPAP  Obstructive sleep apnea managed with CPAP. Coughing affects CPAP use and sleep quality.    Rosina Linsey, APRN, CNP  04/05/2024 10:56     This note was partially created using M*Modal fluency direct system (voice recognition software ) and is inherently subject to errors including those of syntax and "sound- alike substitutions which may escape proofreading.  In such instances, original meaning may be extrapolated by contextual derivation..       [1]   Allergies  Allergen Reactions    Hydrocodone  Rash    Prednisone   Other Adverse Reaction (Add comment)     Insomnia

## 2024-05-11 ENCOUNTER — Other Ambulatory Visit (INDEPENDENT_AMBULATORY_CARE_PROVIDER_SITE_OTHER): Payer: Self-pay | Admitting: Internal Medicine

## 2024-05-11 DIAGNOSIS — I10 Essential (primary) hypertension: Secondary | ICD-10-CM

## 2024-07-02 ENCOUNTER — Encounter (INDEPENDENT_AMBULATORY_CARE_PROVIDER_SITE_OTHER): Payer: Self-pay | Admitting: Internal Medicine

## 2024-07-02 NOTE — Telephone Encounter (Signed)
 The influenza vaccine was not given because the patient/caregiver declined. Per MyChart message

## 2024-07-18 ENCOUNTER — Encounter (INDEPENDENT_AMBULATORY_CARE_PROVIDER_SITE_OTHER): Payer: Self-pay | Admitting: Internal Medicine

## 2024-10-22 ENCOUNTER — Ambulatory Visit (INDEPENDENT_AMBULATORY_CARE_PROVIDER_SITE_OTHER): Payer: Self-pay | Admitting: Internal Medicine
